# Patient Record
Sex: Female | Born: 1985 | Race: Asian | Hispanic: No | Marital: Married | Smoking: Never smoker
Health system: Southern US, Community
[De-identification: ages and names within clinical notes are randomized; demographics above are authoritative.]

## PROBLEM LIST (undated history)

## (undated) ENCOUNTER — Inpatient Hospital Stay (HOSPITAL_COMMUNITY): Payer: Self-pay

## (undated) DIAGNOSIS — E119 Type 2 diabetes mellitus without complications: Secondary | ICD-10-CM

## (undated) DIAGNOSIS — O24419 Gestational diabetes mellitus in pregnancy, unspecified control: Secondary | ICD-10-CM

## (undated) DIAGNOSIS — E282 Polycystic ovarian syndrome: Secondary | ICD-10-CM

## (undated) DIAGNOSIS — T7840XA Allergy, unspecified, initial encounter: Secondary | ICD-10-CM

## (undated) DIAGNOSIS — F419 Anxiety disorder, unspecified: Secondary | ICD-10-CM

## (undated) DIAGNOSIS — F32A Depression, unspecified: Secondary | ICD-10-CM

## (undated) DIAGNOSIS — D62 Acute posthemorrhagic anemia: Secondary | ICD-10-CM

## (undated) DIAGNOSIS — F329 Major depressive disorder, single episode, unspecified: Secondary | ICD-10-CM

## (undated) DIAGNOSIS — K589 Irritable bowel syndrome without diarrhea: Secondary | ICD-10-CM

## (undated) DIAGNOSIS — M545 Low back pain, unspecified: Secondary | ICD-10-CM

## (undated) DIAGNOSIS — J302 Other seasonal allergic rhinitis: Secondary | ICD-10-CM

## (undated) DIAGNOSIS — K859 Acute pancreatitis without necrosis or infection, unspecified: Secondary | ICD-10-CM

## (undated) DIAGNOSIS — K219 Gastro-esophageal reflux disease without esophagitis: Secondary | ICD-10-CM

## (undated) DIAGNOSIS — T8859XA Other complications of anesthesia, initial encounter: Secondary | ICD-10-CM

## (undated) DIAGNOSIS — K529 Noninfective gastroenteritis and colitis, unspecified: Secondary | ICD-10-CM

## (undated) HISTORY — DX: Polycystic ovarian syndrome: E28.2

## (undated) HISTORY — DX: Acute pancreatitis without necrosis or infection, unspecified: K85.90

## (undated) HISTORY — DX: Other seasonal allergic rhinitis: J30.2

## (undated) HISTORY — DX: Low back pain, unspecified: M54.50

## (undated) HISTORY — DX: Noninfective gastroenteritis and colitis, unspecified: K52.9

## (undated) HISTORY — DX: Gastro-esophageal reflux disease without esophagitis: K21.9

## (undated) HISTORY — DX: Other complications of anesthesia, initial encounter: T88.59XA

## (undated) HISTORY — DX: Irritable bowel syndrome without diarrhea: K58.9

## (undated) HISTORY — PX: LASIK: SHX215

## (undated) HISTORY — DX: Acute posthemorrhagic anemia: D62

## (undated) HISTORY — DX: Irritable bowel syndrome, unspecified: K58.9

## (undated) HISTORY — DX: Anxiety disorder, unspecified: F41.9

## (undated) HISTORY — DX: Major depressive disorder, single episode, unspecified: F32.9

## (undated) HISTORY — DX: Gestational diabetes mellitus in pregnancy, unspecified control: O24.419

## (undated) HISTORY — DX: Allergy, unspecified, initial encounter: T78.40XA

## (undated) HISTORY — DX: Depression, unspecified: F32.A

## (undated) HISTORY — DX: Type 2 diabetes mellitus without complications: E11.9

---

## 2007-01-10 HISTORY — PX: BACK SURGERY: SHX140

## 2007-09-10 HISTORY — PX: LUMBAR DISC SURGERY: SHX700

## 2008-08-03 HISTORY — PX: ESOPHAGOGASTRODUODENOSCOPY: SHX1529

## 2009-01-19 HISTORY — PX: COLONOSCOPY W/ BIOPSIES: SHX1374

## 2010-01-09 HISTORY — PX: LASIK: SHX215

## 2011-11-15 ENCOUNTER — Emergency Department: Payer: Self-pay

## 2011-11-15 ENCOUNTER — Emergency Department
Admission: EM | Admit: 2011-11-15 | Discharge: 2011-11-16 | Disposition: A | Discharge status: Home / Self Care | Payer: Charity | Attending: Emergency Medicine | Admitting: Emergency Medicine

## 2011-11-15 DIAGNOSIS — R209 Unspecified disturbances of skin sensation: Secondary | ICD-10-CM | POA: Insufficient documentation

## 2011-11-15 DIAGNOSIS — R51 Headache: Secondary | ICD-10-CM | POA: Insufficient documentation

## 2011-11-15 DIAGNOSIS — R5381 Other malaise: Secondary | ICD-10-CM | POA: Insufficient documentation

## 2011-11-15 DIAGNOSIS — M542 Cervicalgia: Secondary | ICD-10-CM | POA: Insufficient documentation

## 2011-11-15 DIAGNOSIS — M549 Dorsalgia, unspecified: Secondary | ICD-10-CM | POA: Insufficient documentation

## 2011-11-15 DIAGNOSIS — Z8739 Personal history of other diseases of the musculoskeletal system and connective tissue: Secondary | ICD-10-CM | POA: Insufficient documentation

## 2011-11-15 DIAGNOSIS — Z8719 Personal history of other diseases of the digestive system: Secondary | ICD-10-CM | POA: Insufficient documentation

## 2011-11-15 DIAGNOSIS — R42 Dizziness and giddiness: Secondary | ICD-10-CM | POA: Insufficient documentation

## 2011-11-15 DIAGNOSIS — R29898 Other symptoms and signs involving the musculoskeletal system: Secondary | ICD-10-CM | POA: Insufficient documentation

## 2011-11-15 DIAGNOSIS — M5430 Sciatica, unspecified side: Secondary | ICD-10-CM | POA: Insufficient documentation

## 2011-11-15 DIAGNOSIS — R002 Palpitations: Secondary | ICD-10-CM | POA: Insufficient documentation

## 2011-11-15 DIAGNOSIS — M545 Low back pain, unspecified: Secondary | ICD-10-CM | POA: Insufficient documentation

## 2011-11-15 LAB — CBC AND DIFFERENTIAL
Basophils Absolute Automated: 0.04 10*3/uL (ref 0.00–0.20)
Basophils Automated: 0 % (ref 0–2)
Eosinophils Absolute Automated: 0.32 10*3/uL (ref 0.00–0.70)
Eosinophils Automated: 3 % (ref 0–5)
Hematocrit: 38.2 % (ref 37.0–47.0)
Hgb: 12.6 g/dL (ref 12.0–16.0)
Immature Granulocytes Absolute: 0.03 10*3/uL
Immature Granulocytes: 0 % (ref 0–1)
Lymphocytes Absolute Automated: 2.47 10*3/uL (ref 0.50–4.40)
Lymphocytes Automated: 26 % (ref 15–41)
MCH: 26.1 pg — ABNORMAL LOW (ref 28.0–32.0)
MCHC: 33 g/dL (ref 32.0–36.0)
MCV: 79.3 fL — ABNORMAL LOW (ref 80.0–100.0)
MPV: 10 fL (ref 9.4–12.3)
Monocytes Absolute Automated: 0.45 10*3/uL (ref 0.00–1.20)
Monocytes: 5 % (ref 0–11)
Neutrophils Absolute: 6.2 10*3/uL (ref 1.80–8.10)
Neutrophils: 65 % (ref 52–75)
Nucleated RBC: 0 /100 WBC (ref 0–1)
Platelets: 264 10*3/uL (ref 140–400)
RBC: 4.82 10*6/uL (ref 4.20–5.40)
RDW: 14 % (ref 12–15)
WBC: 9.51 10*3/uL (ref 3.50–10.80)

## 2011-11-15 LAB — URINALYSIS WITH MICROSCOPIC
Bilirubin, UA: NEGATIVE
Blood, UA: NEGATIVE
Glucose, UA: NEGATIVE
Ketones UA: NEGATIVE
Leukocyte Esterase, UA: NEGATIVE
Nitrite, UA: NEGATIVE
Protein, UR: NEGATIVE
Specific Gravity UA: 1.005 (ref 1.001–1.035)
Urine pH: 6.5 (ref 5.0–8.0)
Urobilinogen, UA: NORMAL mg/dL

## 2011-11-15 LAB — LIPASE: Lipase: 32 U/L (ref 8–78)

## 2011-11-15 LAB — POCT PREGNANCY TEST, URINE HCG: POCT Pregnancy HCG Test, UR: NEGATIVE

## 2011-11-15 LAB — COMPREHENSIVE METABOLIC PANEL
ALT: 27 U/L (ref 0–55)
AST (SGOT): 20 U/L (ref 5–34)
Albumin/Globulin Ratio: 1.2 (ref 0.9–2.2)
Albumin: 4.2 g/dL (ref 3.5–5.0)
Alkaline Phosphatase: 73 U/L (ref 40–150)
BUN: 10 mg/dL (ref 7.0–19.0)
Bilirubin, Total: 0.4 mg/dL (ref 0.2–1.2)
CO2: 23 mEq/L (ref 22–29)
Calcium: 9.5 mg/dL (ref 8.5–10.5)
Chloride: 106 mEq/L (ref 98–107)
Creatinine: 0.8 mg/dL (ref 0.6–1.0)
Globulin: 3.6 g/dL (ref 2.0–3.6)
Glucose: 104 mg/dL — ABNORMAL HIGH (ref 70–100)
Potassium: 4.1 mEq/L (ref 3.5–5.1)
Protein, Total: 7.8 g/dL (ref 6.0–8.3)
Sodium: 138 mEq/L (ref 136–145)

## 2011-11-15 LAB — GFR: EGFR: >60

## 2011-11-15 MED ORDER — OXYCODONE-ACETAMINOPHEN 5-325 MG PO TABS
2.00 | ORAL_TABLET | Freq: Once | ORAL | Status: AC
Start: 2011-11-15 — End: 2011-11-15
  Administered 2011-11-15: 2 via ORAL
  Filled 2011-11-15: qty 2

## 2011-11-15 MED ORDER — ONDANSETRON HCL 4 MG/2ML IJ SOLN
4.00 mg | Freq: Once | INTRAMUSCULAR | Status: AC
Start: 2011-11-15 — End: 2011-11-15
  Administered 2011-11-15: 4 mg via INTRAVENOUS
  Filled 2011-11-15: qty 2

## 2011-11-15 MED ORDER — DIAZEPAM 5 MG PO TABS
5.00 mg | ORAL_TABLET | Freq: Once | ORAL | Status: AC
Start: 2011-11-15 — End: 2011-11-15
  Administered 2011-11-15: 5 mg via ORAL
  Filled 2011-11-15: qty 1

## 2011-11-15 MED ORDER — DIPHENHYDRAMINE HCL 50 MG/ML IJ SOLN
25.00 mg | Freq: Once | INTRAMUSCULAR | Status: AC
Start: 2011-11-15 — End: 2011-11-15
  Administered 2011-11-15: 25 mg via INTRAVENOUS
  Filled 2011-11-15: qty 1

## 2011-11-15 NOTE — ED Provider Notes (Signed)
Date: 11/15/2011  Time: 7:25 PM  Patient Name: [name],[name]  Attending Physician: Claiborne Billings, MD    Attending Note:   I have reviewed and agree with history. The pertinent physical exam has been documented. The patient was seen and examined by the  resident Stevenson Clinch, MD. The plan of care was discussed with me.     Selected historical findings: [name] [name] is a 27 y.o.female with a h/o chronic back pain, pancreatitis, colitis, c/o lower back pain, bilat leg pain, and abd pain. Pt rpts these sxs are typically chronic but have progressively intensified today prompting ED visit. Pt rpts that sxs feel similar to what she experienced prior to her back surgery for disc herniation in 2010. Pt rpts pain used to be manageable. Pt rpts being able to ambulate but with moderate amount of pain. Pt also c/o L 1st toe pain. Pt rpts associated sx of dysuria. Pt denies loss of sensation, incontinence of bladder or bowel, any other concerns.     History obtained by patient.     A/P: 26 y/o F with h/o acute back pain and abd pain.      Back pain - worsening lumbar back pain, weakness in left lower ext.  Reports weakness as new.  No loss of control of bowel/bladder.  No fever.  Will evaluate with MRI.      abd pain - benign exam, exacerbation of chronic pain.  Will give lab work, re-exam.      10:03 PM  Repeat exam after pain control.  Still with LLE weakness.  Will evaluate with imaging.  Labs ok.  Pt signed out to Dr. Arvilla Market to follow MRI results and dispo patient.        CRITICAL CARE TIME: No Critical Care Time    PMH:  Past Medical History   Diagnosis Date   . Pancreatitis    . Colitis      Past Surgical History   Procedure Date   . Back surgery        Social History:  History     Social History   . Marital Status: Married     Spouse Name: N/A     Number of Children: N/A   . Years of Education: N/A     Social History Main Topics   . Smoking status: Not on file   . Smokeless tobacco: Not on file   .  Alcohol Use:    . Drug Use:    . Sexually Active: Not on file     Other Topics Concern   . Not on file     Social History Narrative   . No narrative on file       ROS : All systems were reviewed and are negative for acute complaints, except as described above.     Selected physical examination findings:  BP 138/97  Pulse 116  Temp 97.3 F (36.3 C)  Resp 18  SpO2 95%  CONSTITUTIONAL: Patient is afebrile, Vital signs stable, Well appearing, Patient appears uncomfortable, Alert and oriented X 3.   HEAD: Atraumatic, Normocephalic.   EYES: Eyes are normal to inspection, Pupils equal, round and reactive to light, Extraocular muscles intact, Sclera are normal, Conjunctiva are normal.   ENT: Mouth normal to inspection.   NECK: Normal ROM, Cervical spine nontender.   RESP: Chest is nontender, Breath sounds normal, No respiratory distress.   CARDIOVASCULAR: RRR, Heart sounds normal, No extremity edema.   ABDOMEN: Mild ttp to entire L side  abdomen, no rebound, no guarding, No masses, No distension.   BACK: There is no CVA Tenderness, There is no tenderness to palpation, Normal inspection.   UPPER EXTREMITY: Inspection normal, No cyanosis, No clubbing, No edema, Normal range of motion.   LOWER EXTREMITY: Inspection normal, No cyanosis, No clubbing, No edema, Normal range of motion, No calf tenderness.   NEURO: GCS is 15, No focal motor deficits, Speech normal.   SKIN: Skin is warm/dry, normal color.   LYMPHATIC: No adenopathy in neck.   PSYCHIATRIC: Oriented X 3, Normal affect.     Neurologic Exam:     MS: Alert, nml orientation, no aphasia.     CN's: CN's 2-12 intact, PERRL, EOMI, no facial droop, nml facial sensation, midline tongue/uvula, no dysarthria.     Motor: No focal motor deficits, 5/5 str in RLE/RUE/LUE, 3/5 in LLE, nml tone, no pronator drift.     Sensory: No focal sensory deficits.     Reflexes:2+ reflexes throughout      Patient Vitals for the past 12 hrs:   BP Temp Pulse Resp   11/15/11 1820 138/97 mmHg 97.3  F (36.3 C) 116  18         UA with Micro (Edited)   Component (Lab Inquiry)         Result Time  Urine Type  COLOR U  CLARITY U  Specific Gravity UA  Urine pH     11/15/11 2106  Clean Catch  Colorless  Clear  1.005  6.5               Result Time  LEUKOCYTES, UR  NITRITE  Protein, UA  Glucose, UA  KETONES U     11/15/11 2106  Negative  Negative  Negative  Negative  Negative               Result Time  Urobilinogen, UA  BILI UR  Blood, UA  RBC UA  WBC U     11/15/11 2106  Normal  Negative  Negative  NOTE: URINE CHEMISTRY INDICATORS ARE NEGATIVE. URINE MICROSCOPIC NOT INDICATED.  0 - 5  0 - 5               Result Time  Squamous Epithelial Cells, Urine     11/15/11 2106  0 - 5             Comprehensive Metabolic Panel (CMP) (Final result)  Abnormal  Component (Lab Inquiry)         Result Time  Glucose  BUN  CREATININE  NA  K     11/15/11 2033  104 (H)  Interpretive Data for Adult Female and Female Population Indeterminate Range: 100-125 mg/dL Equal to or greater than 126 mg/dL meets the ADA guidelines for Diabetes Mellitus diagnosis if symptoms are present and confirmed by repeat testing. Random (Non-Fasting)Interpretive Data (Adults): Equal to or greater than 200 mg/dL meets the ADA guidelines for Diabetes Mellitus diagnosis if symptoms are present and confirmed by Fasting Glucose or GTT.  10.0  0.8  138  4.1               Result Time  CL  CO2  CALCIUM  PROTEIN  Albumin     11/15/11 2033  106  23  9.5  7.8  4.2               Result Time  AST  ALT  ALK PHOS  BILI INDIR  Globulin  11/15/11 2033  20  27  73  0.4  3.6               Result Time  Albumin/Globulin Ratio     11/15/11 2033  1.2             LAB-Lipase (Final result)   Component (Lab Inquiry)         Result Time  LIPASE     11/15/11 2033  32             GFR (Final result)   Component (Lab Inquiry)         Result Time  EGFR     11/15/11 2033  >60.0  Disease State Reference Ranges: Chronic Kidney Disease; < 60 ml/min/1.73 sq.m Kidney Failure; < 15 ml/min/1.73 sq.m  [Calculated using IDMS-Traceable MDRD equation (based on gender, age and black vs. non-black race) recommended by [employer] Kidney Disease Education Program. No data available for non-white, non-black race.] GFR estimates are unreliable in patients with: Rapidly changing kidney function or recent dialysis Extreme age, body size or body composition(obesity, severe malnutrition). Abnormal muscle mass (limb amputation, muscle wasting). In these patients, alternative determinations of GFR should be obtained.             CBC and Differential (Final result)  Abnormal  Component (Lab Inquiry)         Result Time  WBC  RBC  Hgb  HCT  MCV     11/15/11 2024  9.51  4.82  12.6  38.2  79.3 (L)               Result Time  MCH  MCHC  RDW  Platelets  MPV     11/15/11 2024  26.1 (L)  33.0  14  264  10.0               Result Time  Neutrophils  Lymphocytes Automated  Monocytes  Eosinophils Automated  Basophils Automated     11/15/11 2024  65  26  5  3   0               Result Time  Immature Granulocyte  Nucleated RBC  NEUTRO ABS  Abs Lymph Automated  Abs Mono Automated     11/15/11 2024  0  0  6.20  2.47  0.45               Result Time  Abs Eos Automated  Absolute Baso Automated  Absolute Immature Granulocyte     11/15/11 2024  0.32  0.04  0.03             Urine HCG POC (Final result)   Component (Lab Inquiry)         Result Time  POCT QC  POCT Pregnancy HCG Test, UR  Comment:     11/15/11 2007  Pass  Negative  Negative Value is Normal in Healthy Males or Healthy non-pregnant Females          Radiology Results (24 Hour)     ** No Results found for the last 24 hours. **          ED Disposition     None          Diagnosis:  No diagnosis found.    _______________________________    ATTESTATION  I am the first provider for this patient and I personally performed the services documented.  Treatment Team: Scribe: Graciella Freer is scribing for me on  [name] [name],[name]. The note accurately reflects work and decisions made by me.   Claiborne Billings,  MD    I, Treatment Team: Scribe: Graciella Freer, am serving as a scribe to document services personally performed by Claiborne Billings, MD based on my observations and the providers statements to me.     Treatment Team: Scribe: Venida Jarvis (ED Scribe)  _______________________________        Tyson Babinski, MD  11/17/11 [phone]

## 2011-11-15 NOTE — ED Notes (Signed)
MRI Checklist completed and faxed at this time.

## 2011-11-15 NOTE — ED Provider Notes (Signed)
Physician/Midlevel provider first contact with patient: 11/15/11 1837         History     Chief Complaint   Patient presents with   . Back Pain   . Leg Pain     Pt is a 26 yo F here for worsening lower-back pain and leg pain. In 2007 she was found to have disk herniation and was tx with steroid injections. Then in 2009 she had back surgery. Pain continued  But was lessened s/p surgery.   Last night she started to develop worsening pain in her lower back and legs b/l. She also states she has diffuse abdominal pain and some neck pain.   Pain in her legs is both in front and back radiating from her back all the way to her toes. She had some trouble ambulating due to the pain. She feels some numbness in her toes and says she has a hard time moving her left toe vs her right.  She also notes that she is sore all over.  No recent illnesses, no N/V. Some constipation and diarrhea intermittently.          Past Medical History   Diagnosis Date   . Pancreatitis    . Colitis        Past Surgical History   Procedure Date   . Back surgery        No family history on file.    Social  History   Substance Use Topics   . Smoking status: Never Smoker    . Smokeless tobacco: Not on file   . Alcohol Use:        .     Allergies   Allergen Reactions   . Dilaudid (Hydromorphone Hcl)    . Morphine        Current/Home Medications    No medications on file        Review of Systems   Constitutional: Positive for activity change and fatigue. Negative for fever.   HENT: Positive for neck pain and neck stiffness. Negative for congestion, facial swelling and rhinorrhea.    Eyes: Positive for visual disturbance. Negative for photophobia.   Respiratory: Negative for apnea, chest tightness and shortness of breath.    Cardiovascular: Positive for palpitations. Negative for chest pain.   Musculoskeletal: Positive for back pain, joint swelling, arthralgias and gait problem.   Neurological: Positive for dizziness, weakness, numbness and headaches.  Negative for seizures, facial asymmetry and light-headedness.       Physical Exam    BP 138/97  Pulse 116  Temp 97.3 F (36.3 C)  Resp 18  SpO2 95%    Physical Exam   Constitutional: She is oriented to person, place, and time. She appears well-developed and well-nourished. She appears distressed.   HENT:   Head: Normocephalic and atraumatic.   Mouth/Throat: Oropharynx is clear and moist. No oropharyngeal exudate.   Eyes: Conjunctivae normal and EOM are normal. Pupils are equal, round, and reactive to light.   Neck: Normal range of motion. Neck supple. Spinous process tenderness and muscular tenderness present. No tracheal deviation present. No thyromegaly present.   Cardiovascular: Normal rate, regular rhythm and normal heart sounds.    No murmur heard.  Pulmonary/Chest: Effort normal and breath sounds normal. No respiratory distress. She has no wheezes. She has no rales.   Abdominal: Soft. Bowel sounds are normal. There is generalized tenderness.   Musculoskeletal:        Right knee: tenderness found.  Left knee: tenderness found.        Right ankle: tenderness.        Left ankle: tenderness.        Cervical back: She exhibits tenderness, bony tenderness and pain.        Lumbar back: She exhibits tenderness.        Right upper leg: She exhibits tenderness.        Left upper leg: She exhibits tenderness.        Right lower leg: She exhibits tenderness.        Left lower leg: She exhibits tenderness.        Right foot: She exhibits tenderness.        Left foot: She exhibits tenderness.        Straight leg raise elicited pain with 2 in off bed b/l   Lymphadenopathy:     She has no cervical adenopathy.   Neurological: She is alert and oriented to person, place, and time. No sensory deficit.   Skin: Skin is warm. No rash noted.       MDM and ED Course     ED Medication Orders      Start     Status Ordering Provider    11/15/11 2230   diphenhydrAMINE (BENADRYL) injection 25 mg   Once      Route: Intravenous   Ordered Dose: 25 mg         Last MAR action:  Given SHELLEY, NEAL H    11/15/11 2015   diazepam (VALIUM) tablet 5 mg   Once      Route: Oral  Ordered Dose: 5 mg         Last MAR action:  Given SHELLEY, NEAL H    11/15/11 2000   ondansetron (ZOFRAN) injection 4 mg   Once      Route: Intravenous  Ordered Dose: 4 mg         Last MAR action:  Given SHELLEY, NEAL H    11/15/11 2000   oxyCODONE-acetaminophen (PERCOCET) 5-325 MG per tablet 2 tablet   Once      Route: Oral  Ordered Dose: 2 tablet         Last MAR action:  Given SHELLEY, NEAL H                 MDM  Number of Diagnoses or Management Options  Diagnosis management comments: 26 yo F with H/O of lumbar disk herniation and associated LE pain s/p surgery in 2009. Presents with worsening of her pain with some associated diffuse sorness. Pt is exhibiting some weakness in her L LE on physical exam. Pt is in mild distress.    Labs show no abnormalities. Pt may benefit from lumbar MRI to evaluate any acute process.        Procedures    Clinical Impression & Disposition     Clinical Impression  Final diagnoses:   None        ED Disposition     None           New Prescriptions    No medications on file               Barnie Alderman, MD  Resident  11/15/11 Jon Billings, MD  Resident  11/16/11 [phone]

## 2011-11-15 NOTE — ED Notes (Signed)
PT C/O LOWER BACK/BILATERAL LEG PAIN, WITH HX OF HERNIATED DISKS AND BACK SURGERY IN 2010; HAS HAD PAIN EVER SINCE SURGERY BUT SAYS SHE WAS UNABLE TO WALK LAST NIGHT; SAYS HER SURGEON IS IN [place] AND SHE DOESN'T HAVE INSURANCE SO HASN'T SEEN THEM; ALSO SAYS SHE IS HAVING ABDOMINAL PAIN BUT LEG/BACK PAIN IS WHAT IS BOTHERING HER MOST

## 2011-11-16 ENCOUNTER — Emergency Department: Payer: Charity

## 2011-11-16 ENCOUNTER — Encounter: Payer: Self-pay | Admitting: Family Medicine

## 2011-11-16 MED ORDER — HYDROCODONE-ACETAMINOPHEN 5-500 MG PO TABS
1.00 | ORAL_TABLET | ORAL | Status: AC | PRN
Start: 2011-11-16 — End: 2011-11-26

## 2011-11-16 MED ORDER — PREDNISONE 10 MG PO TABS
ORAL_TABLET | ORAL | Status: AC
Start: 2011-11-16 — End: 2011-11-26

## 2011-11-16 MED ORDER — GADOBUTROL 1 MMOL/ML IV SOLN
7.00 mL | Freq: Once | INTRAVENOUS | Status: AC | PRN
Start: 2011-11-16 — End: 2011-11-16
  Administered 2011-11-16: 7 mmol via INTRAVENOUS
  Filled 2011-11-16: qty 7.5

## 2011-11-16 MED ORDER — IBUPROFEN 800 MG PO TABS
ORAL_TABLET | ORAL | Status: AC
Start: 2011-11-16 — End: 2011-11-26

## 2011-11-16 NOTE — ED Provider Notes (Addendum)
Second MD Attestation    I saw the patient as the second MD provider.     Reevaluation    Pt is awaiting MRI results.     Elray Mcgregor, MD  11/16/11 0206    Reevaluation/ED Course    I spoke c rads md re mri: he says that there is mild disc bulge at l5-s1, though worse on the r than left. No c.e.s.  Will d/c pt home with close f/up--ret precs given in case of worsening sx, bilat weakness dev'ing, or any bowel or bladder sx.     I counseled extensively on nature of problem--voiced understanding, agrees to follow up.   Given strict return precautions--fully understands:   Pt is to return immediately to emergency department if any worsening symptoms at all--otherwise, return to the emergency department tomorrow for a recheck or follow up with primary physician tomorrow.        Lab/Radiology Results Interpretation    All the following lab/radiology results available were both reviewed and interpreted by me Arvilla Market).     Results     Procedure Component Value Units Date/Time    UA with Micro [[id number]] Collected:11/15/11 1956    Specimen Information:Urine Updated:11/15/11 2106     Urine Type Clean Catch      Color, UA Colorless      Clarity, UA Clear      Specific Gravity UA 1.005      Urine pH 6.5      Leukocytes, UA Negative      Nitrite, UA Negative      Protein, UA Negative      Glucose, UA Negative      Ketones UA Negative      Urobilinogen, UA Normal mg/dL      Bilirubin, UA Negative      Blood, UA Negative      RBC, UA 0 - 5 /HPF      WBC, UA 0 - 5 /HPF      Squamous Epithelial Cells, Urine 0 - 5 /HPF     Comprehensive Metabolic Panel (CMP) [[id number]]  (Abnormal) Collected:11/15/11 1956    Specimen Information:Blood Updated:11/15/11 2033     Glucose 104 (H) mg/dL      BUN 57.8 mg/dL      Creatinine 0.8 mg/dL      Sodium 469 mEq/L      Potassium 4.1 mEq/L      Chloride 106 mEq/L      CO2 23 mEq/L      Calcium 9.5 mg/dL      Protein, Total 7.8 g/dL      Albumin 4.2 g/dL      AST (SGOT) 20 U/L      ALT 27 U/L       Alkaline Phosphatase 73 U/L      Bilirubin, Total 0.4 mg/dL      Globulin 3.6 g/dL      Albumin/Globulin Ratio 1.2     LAB-Lipase [[id number]] Collected:11/15/11 1956    Specimen Information:Blood Updated:11/15/11 2033     Lipase 32 U/L     GFR [[id number]] Collected:11/15/11 1956     EGFR >60.0   Updated:11/15/11 2033    CBC and Differential [[id number]]  (Abnormal) Collected:11/15/11 1956    Specimen Information:Blood Updated:11/15/11 2024     WBC 9.51 x10 3/uL      RBC 4.82 x10 6/uL      Hgb 12.6 g/dL      Hematocrit 74.2 %      MCV  79.3 (L) fL      MCH 26.1 (L) pg      MCHC 33.0 g/dL      RDW 14 %      Platelets 264 x10 3/uL      MPV 10.0 fL      Neutrophils 65 %      Lymphocytes Automated 26 %      Monocytes 5 %      Eosinophils Automated 3 %      Basophils Automated 0 %      Immature Granulocyte 0 %      Nucleated RBC 0 /100 WBC      Neutrophils Absolute 6.20 x10 3/uL      Abs Lymph Automated 2.47 x10 3/uL      Abs Mono Automated 0.45 x10 3/uL      Abs Eos Automated 0.32 x10 3/uL      Absolute Baso Automated 0.04 x10 3/uL      Absolute Immature Granulocyte 0.03 x10 3/uL     Urine HCG POC [[id number]] Collected:11/15/11 2008    Specimen Information:Urine Updated:11/15/11 2008     POCT QC Pass      POCT Pregnancy HCG Test, UR Negative      Comment:        Result:     Negative Value is Normal in Healthy Males or Healthy non-pregnant Females        Radiology Results (24 Hour)     Procedure Component Value Units Date/Time    MRI L-Spine with/without Contrast [[id number]] Resulted:11/16/11 0300    Order Status:Sent  Updated:11/16/11 0055            Diagnosis    Back Pain and Leg Pain    There were no encounter diagnoses.    Condition    Stable      Elray Mcgregor, MD  11/16/11 [phone]

## 2011-11-16 NOTE — Discharge Instructions (Signed)
Return immediately to the emergency room if you have any worsening symptoms!!! Otherwise, see general doctor in the next 1 day, or return to the emergency department in 1 day if you have any trouble getting an appointment.     Sedating Medication (Edu)    You have been given a medication, or a prescription for a medication, that causes drowsiness or dizziness. While taking this medication, DO NOT drive a car, operate machinery, or perform jobs that require you to be alert.    DO NOT drink alcoholic beverages while taking this medicine.    If you become dizzy, sit or lie down at the first signs. You should be careful when going up and down stairs.    Never give this medication to others.    Keep this medication out of reach of children.    You should not take or save old medications. They should be thrown away when outdated.    Keep all medicines in a cool, dry place. DO NOT keep them in your bathroom medicine cabinet or in a cabinet above the stove.            Sciatica    You have been diagnosed with sciatica.    Sciatica is a general term for pain in the low back, hip, or buttocks that travels down the leg. The pain rarely goes below the knees. The pain can be severe and can last for a few days up to a few weeks. A common cause of this type of pain is a pinched nerve from a slipped disk. Sometimes the cause of the pain is never determined.    Treatment includes rest and pain medications. Steroids may be helpful, especially if the cause is a pinched nerve.    YOU SHOULD SEEK MEDICAL ATTENTION IMMEDIATELY, EITHER HERE OR AT THE NEAREST EMERGENCY DEPARTMENT, IF ANY OF THE FOLLOWING OCCURS:   You develop numbness (loss of feeling) or tingling (pins and needles) in your legs.   You develop weakness in your legs that keeps you from standing or walking or makes you stumble or trip over your own feet.   You have trouble controlling your bowels or bladder (you soil or wet yourself).   (MEN) You are unable to  have an erection.   Your symptoms become worse.   You have fevers or shaking chills, especially with an increase in back pain.      Back Pain NOS    You have been seen for back pain.    Back pain can happen anywhere from the neck down to the low back. Back pain has many different causes. Some of the more common are: Bone pain, muscle strain, muscle spasm, pain from overuse, and pinched nerves. Other problems can cause what feels like back pain. But the pain is really coming from another organ. This could be a kidney infection. This can cause lower back pain.    Your doctor did not find any pain over the bones in your back (even though you might have pain in the muscles of the back). This means it is very unlikely that you have a broken bone (fracture) in your back. Your doctor did not think it was necessary to take an x-ray.    The doctor still does not know the exact cause of your pain. Your problem does not seem to be from a dangerous cause. It is safe for you to go home today.    Some things you can try to help  your back feel better are:   Apply a warm damp washcloth to the back where you have pain for 20 minutes at a time, at least 4 times per day.    Have someone massage the sore parts of your back.    Don t do any heavy lifting or bending. You can go back to normal daily activities if they don t make the pain worse.   You can use anti-inflammatory pain medicine for your pain. This could be Ibuprofen (Advil or Motrin). You can buy these at most stores. Follow the directions on the package.    This pain may last for the next few days. If your pain gets better, you probably do not need to see a doctor. However, if your symptoms get worse or you have new symptoms, you should return here or go to the nearest Emergency Department.    Call your physician or go to the nearest Emergency Department if you your pain does not improve within 4 weeks or your pain is bad enough to seriously limit your normal  activities.    YOU SHOULD SEEK MEDICAL ATTENTION IMMEDIATELY, EITHER HERE OR AT THE NEAREST EMERGENCY DEPARTMENT, IF ANY OF THE FOLLOWING OCCURS:   - You think the pain is coming from somewhere other than your back. This can include chest pain. This is sometimes from angina (heart pains) or other dangerous causes.   You have shortness of breath, sweating, chest pain (or pressure, heaviness, indigestion, etc).   You have abdominal (belly) pain that goes through to your back.   Your arms and legs tingle or get numb (lose feeling).   Your arms or legs are weak.   You lose control of your bladder or bowels. If this were to happen, it may cause you to wet or soil yourself.    You have problems urinating (peeing).   You have fevers (a temperature of more than 100.79F or 38C).   Your pain gets worse.      [facility] Referral    [facility] Referral     You have been referred to the [facility] for your follow-up care. Please contact them for assistance at [phone] or [phone].  You can also find them online at:  [email or web address]    [facility] Referral    You have been referred to the [facility] for management of your back problem. They have a personalized, comprehensive, multi-specialty approach as well as physical therapy centers, imaging centers and pain clinics. Please call them today or the next business day to arrange for follow-up care.    Phone: [phone]  Web: [email or web address]

## 2011-12-15 ENCOUNTER — Ambulatory Visit: Payer: Self-pay

## 2011-12-22 ENCOUNTER — Ambulatory Visit: Admission: RE | Admit: 2011-12-22 | Payer: Self-pay | Source: Ambulatory Visit

## 2012-01-16 ENCOUNTER — Ambulatory Visit: Admission: RE | Admit: 2012-01-16 | Payer: Self-pay | Source: Ambulatory Visit

## 2012-01-16 ENCOUNTER — Ambulatory Visit
Admission: RE | Admit: 2012-01-16 | Discharge: 2012-01-16 | Disposition: A | Discharge status: Home / Self Care | Payer: Charity | Source: Ambulatory Visit | Attending: Internal Medicine | Admitting: Internal Medicine

## 2012-01-16 DIAGNOSIS — M545 Low back pain, unspecified: Secondary | ICD-10-CM | POA: Insufficient documentation

## 2012-01-16 NOTE — PT Eval Note (Addendum)
Physical Therapy Spine Evaluation          [facility]   [address]   [place], [place] [address]   [phone]     Patient:  [name] [name]                                                   BJY#:[id number]      Start of Care:  01/16/2012                                                         No of Visits:    Time of treatment:  Start Time : 1405                               Stop Time: 1510    Evaluation    Therapeutic ex  15 mins         Precautions/Contraindications: None  FALLS RISK: No    Diagnosis: Low back pain with H/O herniated disc         Referring MD: Pollyann Glen, MD.    Patient's husband came with her and was present throughout the evaluation.  History: H/O low back pain since 2006. Reports couple of falls,fell on ice once. Conservative treatment did not work and had surgery in 2009. States that pain did not really go away after surgery. Had no PT after surgery. Hasn't done any exercises. Had gained a lot of weight, lost ~18-20 lbs last year. Now has low back pain radiating to both LEs left more than right. Multiple joint pain, soreness in the ankle. Disturbed sleep.  Pt stated that she is concerned about her left big toe pain because it is a new pain.  Social: Married, does not work at this time. 10-12 steps to get into the apartment. Used to walk but hasn't done it since back pain got worse in November 2013. No exercises. Heat helps.  Prior level of function: Independent with some back pain.  Cognition: Oriented to time, place and person.  Functional Limitations: Lifting, sitting, standing, walking.  Posture: overweight, increased lumbar lordosis.  Pelvic alignment: LOL, left anterior innominate.  Gait: No obvious deviations noted, no assistive device.  Palpation: Tenderness over lumbar paraspinals.  Pain: 7/10    ROM L/T SPINE C SPINE     Forward Bending 40 NT     Backward  Bending NT      Side Bending Left 30      Side Bending Right 30      Rotation Left NT      Rotation Right NT          Strength R L     Iliopsoas (L2) 5 5     Quadriceps (L3) 5 5     Tibialis Anterior (L4) 5 5     EHL (L5) 5 4+     Peroneus L & B (S1) 5 5     Gluts 4 4+     Hamstrings (S1,2) 5 4+     Abdominals      weak  Back Extensors weak      Heel/Toe Walk NT        Special Tests:  Slump: NT  Sciatic nerve: -ve  Femoral nerve: -ve  SLR: -ve  FABER: -ve  LLD: no     Flexibility: Tight hip flexors, ITB, gastrocs bilaterally, left more than right.  Sensation and DTR: Sensation diminished to light touch over posterior- medial calf, medial thigh on left.     DTR: Patellar 2+ bilaterally, Achilles left 2+, right could not elicit.    Treatment Activities:  METs to correct pelvic alignment, HEP instructed, copies given.    Educated the patient to role of physical therapy, plan of care, goals of therapy. Patient is concerned about her big toe pain and about back pain. Suggested her to talk to her primary about toe pain. See how PT helps and then consider further consult with either Drs Fitzpatrick/ Selena Batten or Neurosurgery.     Assessment:  27 year old female with H/O low back pain radiating to both LEs, H/O back surgery in 2009, seen in PT for consult.    Oswestry:34%    PT Diagnosis: Increased pain, decreased strength, SI dysfunction, multiple joint pain.  Goal potential/prognosis: Good  Goals: Independent HEP and symptom management.  Patient Education:HEP, copies given.  Patient Goal: Relieve pain, gain strength and balance.  Treatment Given: Evaluation, HEP.    Plan:  Patient will be seen 2 x week for 4 weeks for manual therapy, therapeutic exercises, HEP modalities as needed.  Pt will continue with HEP as instructed.     Erin Sons, PT, DPT      [place] [phone]

## 2012-01-25 ENCOUNTER — Ambulatory Visit: Payer: Charity

## 2012-02-05 ENCOUNTER — Ambulatory Visit
Admission: RE | Admit: 2012-02-05 | Discharge: 2012-02-05 | Disposition: A | Discharge status: Home / Self Care | Payer: Charity | Source: Ambulatory Visit

## 2012-02-05 NOTE — Progress Notes (Signed)
[facility]  [address]  [place], [place] [address]  [phone]    Daily Treatment Note    Patient:  [name] [name]     MRN#:  [id number]  Start of care:  01/16/2012              Medical Dx: Low back pain with H/O herniated disc  Therapy Dx: Increased pain, decreased strength, SI dysfunction, multiple joint pain  Referring MD: Pollyann Glen, MD.      Time Calculation  PT Received On: 02/05/12  Start Time: 1420  Stop Time: 1505  Time Calculation (min): 45 min      Therapeutic ex      30 mins   Heat     15 mins         min      Visit number: 2  Change in Medications: No  Change in Medical Status: No    Precautions/Contraindications:None    FALLS RISK: No    Subjective:   Doing the exercises regularly. Continues to have pain.  Pain: 5-7/10  Patient is appropriate for Physical Therapy intervention at this time.  Pt is agreeable to participation in the therapy session.     Objective:  Pelvic alignment: good.  Stretches: KTC,LTR, hamstrings, gastrocs, hip flexors, ITB, piriformis.  STM to lumbar paraspinals, surgical scar.  LLLT to L5-S1 and gluteal region bilaterally.    Heat to back  X 15 minutes.    Treatment Activities  Educated the patient to role of physical therapy, plan of care, goals of therapy.    Assessment:  Good pelvic alignment. Improved flexibility.    Plan:  Continue PT. Review and update exercises as needed.  Pt will continue with HEP as instructed.     Erin Sons, PT, DPT      [place] [phone]

## 2012-02-08 ENCOUNTER — Ambulatory Visit
Admission: RE | Admit: 2012-02-08 | Discharge: 2012-02-08 | Disposition: A | Discharge status: Home / Self Care | Payer: Charity | Source: Ambulatory Visit

## 2012-02-08 NOTE — Progress Notes (Signed)
[facility]  [address]  [place], [place] [address]  [phone]    Daily Treatment Note    Patient:  [name] [name]     MRN#:  [id number]  Start of care:  01/16/2012              Medical Dx: Low back pain with H/O herniated disc  Therapy Dx: Increased pain, decreased strength, SI dysfunction, multiple joint pain  Referring MD: Pollyann Glen, MD.      Time Calculation  PT Received On: 02/08/12  Start Time: 1435  Stop Time: 1530  Time Calculation (min): 55 min      Therapeutic ex      30 mins   Heat     15 mins         min      Visit number: 3  Change in Medications: No  Change in Medical Status: No    Precautions/Contraindications:None    FALLS RISK: No    Subjective:   Was sore for the rest of the day after PT last time. Continues to have pain.      Pain: 5-7/10  Patient is appropriate for Physical Therapy intervention at this time.  Pt is agreeable to participation in the therapy session.     Objective:  Pelvic alignment: good.  Stretches: KTC,LTR, hamstrings, gastrocs, hip flexors, ITB, piriformis.  STM to lumbar paraspinals.  LLLT to L5-S1 and gluteal region bilaterally.    Heat to back  X 15 minutes.    Treatment Activities  Educated the patient to role of physical therapy, plan of care, goals of therapy.    Assessment:  Good pelvic alignment. Improved flexibility.    Plan:  Continue PT. Start gentle stabilization exercises next session.  Pt will continue with HEP as instructed.     Erin Sons, PT, DPT      [place] [phone]

## 2012-02-12 ENCOUNTER — Ambulatory Visit
Admission: RE | Admit: 2012-02-12 | Discharge: 2012-02-12 | Disposition: A | Discharge status: Home / Self Care | Payer: Charity | Source: Ambulatory Visit | Attending: Internal Medicine | Admitting: Internal Medicine

## 2012-02-12 ENCOUNTER — Other Ambulatory Visit: Payer: Self-pay | Admitting: Internal Medicine

## 2012-02-12 DIAGNOSIS — M79609 Pain in unspecified limb: Secondary | ICD-10-CM | POA: Insufficient documentation

## 2012-02-12 DIAGNOSIS — M545 Low back pain, unspecified: Secondary | ICD-10-CM | POA: Insufficient documentation

## 2012-02-12 NOTE — Progress Notes (Signed)
[facility]  [address]  [place], [place] [address]  [phone]    Daily Treatment Note    Patient:  [name] [name]     MRN#:  [id number]  Start of care:  01/16/2012              Medical Dx: Low back pain with H/O herniated disc  Therapy Dx: Increased pain, decreased strength, SI dysfunction, multiple joint pain  Referring MD: Pollyann Glen, MD.       Treatment Date: 02/12/12  Treatment Time: 1515- 1615      Therapeutic ex      40 mins   Heat     15 mins         min      Visit number: 3  Change in Medications: No  Change in Medical Status: No    Precautions/Contraindications:None    FALLS RISK: No    Subjective:   Was sore for the rest of the day after PT last time. Continues to have pain.      Pain: 5-6/10  Patient is appropriate for Physical Therapy intervention at this time.  Pt is agreeable to participation in the therapy session.     Objective:  Reviewed HEP w/pt.  Cues needed with most ex - cannot recall ex independent of cues (verbal and visual)  Instructed in correct sit/Supine and correct squat to pick up item on floor.    Initiated lumbar stabilization with HEP as follows:  Hook lying TVA 5-10 sec x 10  Hook lying w/ TVA and hip flex x 5 sec x 1o, alternate LE  Hook lying w/ TVA with LAQ, alternate LE x 10  Sarhman level 1 x 10  Edcucation: HEP progression, to D/C other "back ex" she is doing of her own and focus on what we teach       Heat to back  X 15 minutes.    Treatment Activities  Educated the patient in HEP, Therapeutic ex , beginning body mechanics.    Assessment:  Pt with good motivation to learn and perform lumbar stabilization ex.  Weak abdominals.    Plan:  Review stabilization exercises initiated this session.  Pt will continue with HEP as instructed.     Maudie Mercury PTA, [place]  [place] [phone]

## 2012-02-16 ENCOUNTER — Ambulatory Visit: Admission: RE | Admit: 2012-02-16 | Payer: Charity | Source: Ambulatory Visit

## 2012-02-19 ENCOUNTER — Ambulatory Visit
Admission: RE | Admit: 2012-02-19 | Discharge: 2012-02-19 | Disposition: A | Discharge status: Home / Self Care | Payer: Charity | Source: Ambulatory Visit

## 2012-02-19 NOTE — Progress Notes (Signed)
[facility]  [address]  [place], [place] [address]  [phone]    Daily Treatment Note    Patient:  [name] [name]     MRN#:  [id number]  Start of care:  01/16/2012              Medical Dx: Low back pain with H/O herniated disc  Therapy Dx: Increased pain, decreased strength, SI dysfunction, multiple joint pain  Referring MD: Pollyann Glen, MD.       Treatment Date: 02/19/12  Treatment Time: 1530 -1615      Therapeutic ex             mins         min      Visit number: 3  Change in Medications: No  Change in Medical Status: No    Precautions/Contraindications:None    FALLS RISK: No    Subjective:   Had a fever last Friday.  A little congestion still. At night more pain - so stretching  Before bed.  Tends to sit on sofa watching tv.  Computer use at table.  Up and moving around a lot.  Wants ex for UE (explained we can only do what script dictates)    Pain: 5/10  Patient is appropriate for Physical Therapy intervention at this time.  Pt is agreeable to participation in the therapy session.     Objective:    Re-instructed correct sit/Supine and correct squat to pick up item on floor.  supine hip flex stretch  ITB stretch  LTR  SKC      Reviewed lumbar stabilization ss follows:  Hook lying TVA 5-10 sec x 10  Hook lying w/ TVA and hip flex x 5 sec x 10, alternate LE  Hook lying w/ TVA with LAQ, alternate LE x 10  Sarhman level 1 x 10 - very challenging today - unable to maintain TVA and avoid Left LBP.  Hook lying with isometric hip add to recruit TVA  TVA w/SLR causes "burning" left LB so defer  Educcation: neutral spine not post pelvic tilt with stabilization ex, correct TVA, pt will need to continue with HEP long after PT is d/c to improve strength and flexibility.  Pt defers Heat to back  today    Treatment Activities  Educated the patient in HEP, Therapeutic ex     Assessment:  Pt with increased cues to keep neutral spine and TVA with higher level ex.  Weak  abdominals.    Plan:  Progress HEP next visit.  Pt will continue with HEP as instructed.     Maudie Mercury PTA, [place]  [place] [phone]

## 2012-02-23 ENCOUNTER — Ambulatory Visit: Admission: RE | Admit: 2012-02-23 | Discharge: 2012-02-23 | Payer: Charity | Source: Ambulatory Visit

## 2012-02-23 NOTE — Progress Notes (Signed)
Pt failed to show for scheduled appointment today.  Maudie Mercury PTA, [place]  [place] [phone]

## 2012-02-26 ENCOUNTER — Ambulatory Visit
Admission: RE | Admit: 2012-02-26 | Discharge: 2012-02-26 | Disposition: A | Discharge status: Home / Self Care | Payer: Charity | Source: Ambulatory Visit

## 2012-02-26 NOTE — Progress Notes (Signed)
[facility]  [address]  [place], [place] [address]  [phone]    Daily Treatment Note    Patient:  [name] [name]     MRN#:  [id number]  Start of care:  01/16/2012              Medical Dx: Low back pain with H/O herniated disc  Therapy Dx: Increased pain, decreased strength, SI dysfunction, multiple joint pain  Referring MD: Pollyann Glen, MD.      Time Calculation  PT Received On: 02/26/12  Start Time: 1200  Stop Time: 1230  Time Calculation (min): 30 min      Therapeutic ex      30 mins              min      Visit number: 6  Change in Medications: No  Change in Medical Status: No    Precautions/Contraindications:None    FALLS RISK: No    Subjective:   Doing OK. Continues to have pain off and on. Doing the exercises regularly.    Pain:4/10  Patient is appropriate for Physical Therapy intervention at this time.  Pt is agreeable to participation in the therapy session.     Objective:  Stretches: KTC,LTR, hamstrings, gastrocs, hip flexors, ITB, piriformis.  Reviewed and performed HEP.  Ex:  TVA, TVA with heel lifts, TVA with SLR x 10 ea.  Sahrmann's level I x 10 ea.  Added clam shell and hip abduction in side lying x 10 ea  Abdominal curls( [place]) x 10 ea  Alternate arm and leg raises in quadruped position x 10 ea. Copies of new exercises given.    Treatment Activities: New HEP added , copies given. Suggested pt to do some aerobic activity such as bicycling, elliptical 2-3 times a week. Start at 5-10 minutes and gradually increase time.  Educated the patient to role of physical therapy, plan of care, goals of therapy.    Assessment:  Knows her exercises, was able to do all exercises with minimal difficulty.    Plan:  Continue PT. Review and update exercises as needed.  Pt will continue with HEP as instructed.     Erin Sons, PT, DPT      [place] [phone]

## 2012-03-18 ENCOUNTER — Ambulatory Visit: Admission: RE | Admit: 2012-03-18 | Discharge: 2012-03-18 | Payer: Charity | Source: Ambulatory Visit

## 2012-03-19 NOTE — Final Progress Note (DC Note for stay less than 48 (Signed)
No show, did not call to cancel today's appointment 03/18/12. D/C PT. Too many no show/CTC.    Erin Sons, PT, DPT      [place] [phone]

## 2012-04-05 ENCOUNTER — Ambulatory Visit: Payer: Charity

## 2012-08-11 ENCOUNTER — Emergency Department: Payer: Charity

## 2012-08-11 ENCOUNTER — Emergency Department
Admission: EM | Admit: 2012-08-11 | Discharge: 2012-08-12 | Disposition: A | Discharge status: Home / Self Care | Payer: Charity | Attending: Emergency Medicine | Admitting: Emergency Medicine

## 2012-08-11 DIAGNOSIS — IMO0002 Reserved for concepts with insufficient information to code with codable children: Secondary | ICD-10-CM | POA: Insufficient documentation

## 2012-08-11 DIAGNOSIS — S6390XA Sprain of unspecified part of unspecified wrist and hand, initial encounter: Secondary | ICD-10-CM | POA: Insufficient documentation

## 2012-08-11 MED ORDER — TRAMADOL HCL 50 MG PO TABS
50.0000 mg | ORAL_TABLET | Freq: Four times a day (QID) | ORAL | Status: DC | PRN
Start: 2012-08-11 — End: 2012-09-24

## 2012-08-11 NOTE — ED Notes (Signed)
Left 3rd digit injury 1 week ago.  Has been taking meds for pain and swelling.  States still having pain and difficulty moving her finger.

## 2012-08-11 NOTE — ED Provider Notes (Signed)
Physician/Midlevel provider first contact with patient: 08/11/12 2242         EMERGENCY DEPARTMENT HISTORY AND PHYSICAL EXAM    Date Time: 08/11/2012 11:40 PM  Patient Name: [name] [name]  Attending MD Etheleen Mayhew               History of Presenting Illness     Chief Complaint:   Chief Complaint   Patient presents with   . Hand Pain       [name] [name] is a 27 y.o. female who presents with persistent worsening left 3rd finger pain after bumping it on a board a week ago. Reports that she did not feel pain immediately after bumping it but then it gradually worsened. Took ibuprofen and placed hand in hot water, with no relief. Associated with left hand soreness and 3rd finger "tightness." No other injury or complaints.     This history was obtained from the patient. They worsen with movement and improve with nothing.     Past Medical History     Past Medical History   Diagnosis Date   . Pancreatitis    . Colitis        Past Surgical History     Past Surgical History   Procedure Date   . Back surgery        Family History     No family history on file.    Social History     History     Social History   . Marital Status: Married     Spouse Name: N/A     Number of Children: N/A   . Years of Education: N/A     Social History Main Topics   . Smoking status: Never Smoker    . Smokeless tobacco: Not on file   . Alcohol Use:    . Drug Use:    . Sexually Active: Not on file     Other Topics Concern   . Not on file     Social History Narrative   . No narrative on file       Allergies     Allergies   Allergen Reactions   . Dilaudid (Hydromorphone Hcl)    . Morphine        Medications     No current facility-administered medications for this encounter.  Current outpatient prescriptions:traMADol (ULTRAM) 50 MG tablet, Take 1-2 tablets (50-100 mg total) by mouth every 6 (six) hours as needed for Pain., Disp: 15 tablet, Rfl: 0    Review of Systems     Positive: L 3rd finger pain and "tightness," L hand  soreness    All Other Systems Reviewed and Negative: Yes    Physical Exam     Constitutional: Vital signs reviewed. Well appearing.  Head:  Normocephalic, atraumatic  Eyes: No conjunctival injection. No discharge.  ENT:   Neck:   Respiratory/Chest:  Cardiovascular:   Abdomen:   Genitourinary:   UpperExtremity: left hand 3rd finger dry scab dorsal 3rd pip, from nvi  LowerExtremity:  Neurological: No focal motor deficits by observation. Speech normal. Memory normal.  Skin: Warm and dry. No rash.  Lymphatic:   Psychiatric: Normal affect. Normal concentration.    Diagnostic Study Results     Labs -   Labs Reviewed - No data to display    Radiologic Studies -   XR HAND LEFT PA LATERAL AND OBLIQUE    (Results Pending)         Clinical Course in the  Emergency Department/Medical Decision Making     I reviewed the vital signs, nursing notes, past medical history, past surgical history, family history and social history.    Vital Signs -   Patient Vitals for the past 12 hrs:   BP Temp Pulse Resp   08/11/12 2224 133/80 mmHg 98.4 F (36.9 C) 88  16        Pulse Oximetry Analysis - nl without need for supplemental oxygen      XRAY I personally reviewed and interpreted the patient's plain film radiology. No fracture,fb noted            Final diagnoses:   Finger sprain, initial encounter     New Prescriptions    TRAMADOL (ULTRAM) 50 MG TABLET    Take 1-2 tablets (50-100 mg total) by mouth every 6 (six) hours as needed for Pain.     ED Disposition     Discharge [name] [name] discharge to home/self care.    Condition at discharge: Stable            _______________________________    Attestations:    I was acting as a scribe for Carmon Sails, MD on Sopp,[name]  Treatment Team: Scribe: Joaquim Lai   I am the first provider for this patient and I personally performed the services documented. Treatment Team: Scribe: Joaquim Lai is scribing for me on Regino,[name]. This note accurately reflects work  and decisions made by me.  Carmon Sails, MD  _______________________________              Carmon Sails, MD  08/12/12 [phone]

## 2012-09-24 ENCOUNTER — Emergency Department: Payer: Charity

## 2012-09-24 ENCOUNTER — Emergency Department
Admission: EM | Admit: 2012-09-24 | Discharge: 2012-09-25 | Disposition: A | Discharge status: Home / Self Care | Payer: Charity | Attending: Emergency Medicine | Admitting: Emergency Medicine

## 2012-09-24 DIAGNOSIS — Z885 Allergy status to narcotic agent status: Secondary | ICD-10-CM | POA: Insufficient documentation

## 2012-09-24 DIAGNOSIS — N83209 Unspecified ovarian cyst, unspecified side: Secondary | ICD-10-CM | POA: Insufficient documentation

## 2012-09-24 LAB — HEPATIC FUNCTION PANEL
ALT: 63 U/L — ABNORMAL HIGH (ref 0–55)
AST (SGOT): 39 U/L — ABNORMAL HIGH (ref 5–34)
Albumin/Globulin Ratio: 1.1 (ref 0.9–2.2)
Albumin: 4.3 g/dL (ref 3.5–5.0)
Alkaline Phosphatase: 72 U/L (ref 40–150)
Bilirubin Direct: 0.1 mg/dL (ref 0.0–0.5)
Bilirubin Indirect: 0.4 mg/dL (ref 0.0–1.1)
Bilirubin, Total: 0.5 mg/dL (ref 0.2–1.2)
Globulin: 3.9 g/dL — ABNORMAL HIGH (ref 2.0–3.6)
Protein, Total: 8.2 g/dL (ref 6.0–8.3)

## 2012-09-24 LAB — URINALYSIS WITH MICROSCOPIC
Bilirubin, UA: NEGATIVE
Blood, UA: NEGATIVE
Glucose, UA: NEGATIVE
Ketones UA: NEGATIVE
Leukocyte Esterase, UA: NEGATIVE
Nitrite, UA: NEGATIVE
Protein, UR: NEGATIVE
Specific Gravity UA: 1.004 (ref 1.001–1.035)
Urine pH: 6.5 (ref 5.0–8.0)
Urobilinogen, UA: NORMAL mg/dL

## 2012-09-24 LAB — POCT PREGNANCY TEST, URINE HCG: POCT Pregnancy HCG Test, UR: NEGATIVE

## 2012-09-24 LAB — CBC AND DIFFERENTIAL
Basophils Absolute Automated: 0.04 (ref 0.00–0.20)
Basophils Automated: 0 %
Eosinophils Absolute Automated: 0.35 (ref 0.00–0.70)
Eosinophils Automated: 4 %
Hematocrit: 37.2 % (ref 37.0–47.0)
Hgb: 12.6 g/dL (ref 12.0–16.0)
Immature Granulocytes Absolute: 0.02
Immature Granulocytes: 0 %
Lymphocytes Absolute Automated: 2.94 (ref 0.50–4.40)
Lymphocytes Automated: 31 %
MCH: 25.7 pg — ABNORMAL LOW (ref 28.0–32.0)
MCHC: 33.9 g/dL (ref 32.0–36.0)
MCV: 75.8 fL — ABNORMAL LOW (ref 80.0–100.0)
MPV: 10.6 fL (ref 9.4–12.3)
Monocytes Absolute Automated: 0.69 (ref 0.00–1.20)
Monocytes: 7 %
Neutrophils Absolute: 5.33 (ref 1.80–8.10)
Neutrophils: 57 %
Nucleated RBC: 0 (ref 0–1)
Platelets: 269 (ref 140–400)
RBC: 4.91 (ref 4.20–5.40)
RDW: 15 % (ref 12–15)
WBC: 9.37 (ref 3.50–10.80)

## 2012-09-24 LAB — BASIC METABOLIC PANEL
BUN: 7 mg/dL (ref 7.0–19.0)
CO2: 21 — ABNORMAL LOW (ref 22–29)
Calcium: 9.7 mg/dL (ref 8.5–10.5)
Chloride: 107 (ref 98–107)
Creatinine: 0.8 mg/dL (ref 0.6–1.0)
Glucose: 96 mg/dL (ref 70–100)
Potassium: 4.1 (ref 3.5–5.1)
Sodium: 139 (ref 136–145)

## 2012-09-24 LAB — LIPASE: Lipase: 38 U/L (ref 8–78)

## 2012-09-24 LAB — GFR: EGFR: >60

## 2012-09-24 MED ORDER — IOHEXOL 240 MG/ML IJ SOLN
50.0000 mL | Freq: Once | INTRAMUSCULAR | Status: AC | PRN
Start: 2012-09-24 — End: 2012-09-24
  Administered 2012-09-24: 50 mL via ORAL

## 2012-09-24 MED ORDER — ONDANSETRON HCL 4 MG/2ML IJ SOLN
4.00 mg | Freq: Once | INTRAMUSCULAR | Status: AC
Start: 2012-09-24 — End: 2012-09-24
  Administered 2012-09-24: 4 mg via INTRAVENOUS
  Filled 2012-09-24: qty 2

## 2012-09-24 MED ORDER — DIPHENHYDRAMINE HCL 50 MG/ML IJ SOLN
INTRAMUSCULAR | Status: DC
Start: 2012-09-24 — End: 2012-09-25
  Filled 2012-09-24: qty 1

## 2012-09-24 MED ORDER — IOHEXOL 350 MG/ML IV SOLN
100.0000 mL | Freq: Once | INTRAVENOUS | Status: AC | PRN
Start: 2012-09-24 — End: 2012-09-24
  Administered 2012-09-24: 100 mL via INTRAVENOUS

## 2012-09-24 MED ORDER — FENTANYL CITRATE 0.05 MG/ML IJ SOLN
50.0000 ug | Freq: Once | INTRAMUSCULAR | Status: AC
Start: 2012-09-24 — End: 2012-09-24
  Administered 2012-09-24: 50 ug via INTRAVENOUS
  Filled 2012-09-24: qty 2

## 2012-09-24 MED ORDER — SODIUM CHLORIDE 0.9 % IV BOLUS
1000.00 mL | Freq: Once | INTRAVENOUS | Status: AC
Start: 2012-09-24 — End: 2012-09-24
  Administered 2012-09-24: 1000 mL via INTRAVENOUS

## 2012-09-24 MED ORDER — HYDROMORPHONE HCL PF 1 MG/ML IJ SOLN
0.5000 mg | Freq: Once | INTRAMUSCULAR | Status: AC
Start: 2012-09-24 — End: 2012-09-24
  Administered 2012-09-24: 0.5 mg via INTRAVENOUS
  Filled 2012-09-24: qty 1

## 2012-09-24 NOTE — ED Notes (Signed)
PO contrast completed, CT aware.

## 2012-09-24 NOTE — ED Provider Notes (Signed)
Physician/Midlevel provider first contact with patient: 09/24/12 1919         History     Chief Complaint   Patient presents with   . Abdominal Pain   . Pelvic Pain     Patient is a 27 y.o. female presenting with abdominal pain and pelvic pain. The history is provided by the patient.   Abdominal Pain  The primary symptoms of the illness include abdominal pain and nausea. The primary symptoms of the illness do not include fever, vomiting or diarrhea.   Pelvic Pain  Associated symptoms include abdominal pain and nausea. Pertinent negatives include no fever or vomiting.   pt c/o suprapubic and pelvic area pain since yesterday.  Also c/o generalized abd discomfort and bloating  Pt notes pain around bladder. Pain became worse around 2 am  Constant pain.  +nausea no vomiting no appetite no diarrhea but loose BM this morning  Pain radiates into rectum  similar pain in the past ~ 2012. diagnsoed with pancreatitis  Pt notes she has had abd pain in the past, diagnosed with IBS  Pt was on bentyl but pt stopped taking the medication due to lightheadedness  No abd surgeries    GI specialist: In [place]  Last colonoscopy:  2011          Past Medical History   Diagnosis Date   . Pancreatitis    . Colitis        Past Surgical History   Procedure Date   . Back surgery        History reviewed. No pertinent family history.    Social  History   Substance Use Topics   . Smoking status: Never Smoker    . Smokeless tobacco: Not on file   . Alcohol Use: No       .     Allergies   Allergen Reactions   . Dilaudid (Hydromorphone Hcl)    . Morphine        Current/Home Medications    OMEPRAZOLE PO    Take by mouth.    PREGABALIN (LYRICA PO)    Take by mouth.        Review of Systems   Constitutional: Negative for fever.   Gastrointestinal: Positive for nausea and abdominal pain. Negative for vomiting and diarrhea.   Genitourinary: Positive for pelvic pain.   All other systems reviewed and are negative.        Physical Exam    BP 120/81   Pulse 86  Temp 97.5 F (36.4 C)  Resp 18  Ht 1.626 m  Wt 78.926 kg  BMI 29.85 kg/m2  SpO2 100%  LMP 07/24/2012    Physical Exam   Nursing note and vitals reviewed.  Constitutional: She is oriented to person, place, and time. She appears well-developed and well-nourished. No distress.   HENT:   Head: Normocephalic and atraumatic.   Mouth/Throat: Oropharynx is clear and moist.   Eyes: Conjunctivae normal and EOM are normal. Pupils are equal, round, and reactive to light. Right eye exhibits no discharge. Left eye exhibits no discharge.   Neck: Normal range of motion and phonation normal. Neck supple. No tracheal deviation present.   Cardiovascular: Normal rate and regular rhythm.    Pulses:       Radial pulses are 2+ on the right side, and 2+ on the left side.   Pulmonary/Chest: Effort normal. No respiratory distress.   Abdominal: Soft. She exhibits no distension.   Genitourinary:  No discharge  No odor  No cmt  ttp over L lower pelvic area     Musculoskeletal: Normal range of motion.   Neurological: She is alert and oriented to person, place, and time. No cranial nerve deficit. GCS eye subscore is 4. GCS verbal subscore is 5. GCS motor subscore is 6.   Skin: Skin is warm and dry. She is not diaphoretic.   Psychiatric: She has a normal mood and affect. Her speech is normal.       MDM and ED Course     ED Medication Orders     None           MDM  Number of Diagnoses or Management Options     Amount and/or Complexity of Data Reviewed  Clinical lab tests: ordered and reviewed  Tests in the radiology section of CPT: ordered and reviewed  Obtain history from someone other than the patient: yes  Discuss the patient with other providers: yes    Risk of Complications, Morbidity, and/or Mortality  Presenting problems: moderate  Diagnostic procedures: moderate  Management options: moderate          Procedures    Clinical Impression & Disposition     Presumptive Diagnosis: abd pain    Treatment Plan: labs ct  meds    _______________________________    I reviewed the vital signs, nursing notes, past medical history, past surgical history, family history and social history.    Vital Signs - Patient Vitals for the past 12 hrs:   BP Temp Pulse Resp   09/24/12 1916 120/81 mmHg - 86  18    09/24/12 1842 125/96 mmHg 97.5 F (36.4 C) 108  16        Pulse Oximetry Analysis - Normal    Differential Diagnosis (not completely inclusive): colitis, abscess,     Laboratory results reviewed by ED PA-C: Yes  Radiologic study results reviewed by ED PA-C: Yes  Radiologic Studies Interpreted (viewed) by ED PA-C :No      Labs -     Results     Procedure Component Value Units Date/Time    Urine HCG POC [[id number]] Collected:09/24/12 1917     POCT QC Pass Updated:09/24/12 1917     POCT Pregnancy HCG Test, UR Negative      Comment:        Result:     Negative Value is Normal in Healthy Males or Healthy non-pregnant Females    Basic Metabolic Panel (BMP) [[id number]] Collected:09/24/12 1902    Specimen Information:Blood Updated:09/24/12 1914    CBC with Differential [[id number]] Collected:09/24/12 1902    Specimen Information:Blood / Blood Updated:09/24/12 1914    UA with Micro [[id number]] Collected:09/24/12 1902    Specimen Information:Urine Updated:09/24/12 1914          Radiologic Studies -   Radiology Results (24 Hour)     ** No Results found for the last 24 hours. **      .    Procedures:     Tommi Rumps, PA-C  7:25 PM                        Clinical Impression  Final diagnoses:   None        ED Disposition     None           New Prescriptions    No medications on file  Tommi Rumps, PA  09/24/12 1926    Tommi Rumps, PA  09/25/12 [phone]

## 2012-09-24 NOTE — ED Notes (Signed)
Pt still having pain, reports fentanyl iv medication is not working to relieve pain. Pt reports allergy to morphine and dilaudid is rash and itchiness. Pt denies respiratory problems with medication or throat closing and would like to try some dilaudid for pain relief. PA aware.

## 2012-09-24 NOTE — ED Notes (Signed)
Family at bedside. 

## 2012-09-24 NOTE — ED Notes (Signed)
Pt medicated with Fentanyl IVP (other 1mL left in bottle from first dose) per verbal order of Asha, PA for increased pain after going to the bathroom.

## 2012-09-24 NOTE — ED Notes (Signed)
Pt with lower abdominal pain radiating to mid abdomen and to pelvic area;vagina and rectumPt with nausea,dysuria.pt denies vaginal discharge.Pt denies bleeding.pt with last menses in July,states is irregular.Pt with bm today,but feels bloated.

## 2012-09-24 NOTE — ED Notes (Signed)
Pt back from CT.  MD at bedside speaking to pt.

## 2012-09-24 NOTE — ED Provider Notes (Signed)
Physician/Midlevel provider first contact with patient: 09/24/12 1919                 Clinical Impression:   Final diagnoses:   Ovarian cyst rupture       Disposition:    ED Disposition     Discharge Nur-E-Tabassum Eplin discharge to home/self care.    Condition at disposition: Stable                    Attending Note:     The patient was seen and examined by the mid-level (physician's assistant or nurse practitioner), or resident/fellow, and the plan of care was discussed with me.  I have reviewed and agree with the history, physical exam, assessments, and/or procedures performed.  I have personally seen, evaluated, and examined this patient.  Jonel Sick S. Kemisha Bonnette MD      Chief Complaint: [name] [name] is a 27 y.o. female with a history of pancreatitis and colitis presenting with lower abd pain radiating to mid abd, pelvic area, groin, and rectum persistent since yesterday, worsening today. Pt also reports nausea, dysuria, and feeling bloating. No vaginal discharge or bleeding. Last BM today. Pt has seen a GI before.       PCP: Pcp, Noneorunknown, MD      Pertinent Physical Exam Findings: TTP in the LLQ and suprapubic abd. Mild TTP in the epigastric region.    VS: BP 117/75  Pulse 62  Temp 97.5 F (36.4 C)  Resp 15  Ht 1.626 m  Wt 78.926 kg  BMI 29.85 kg/m2  SpO2 99%  LMP 07/24/2012      Differential: Colitis, UTI, pancreatitis, SBO, diverticulitis, ovarian pathology    Plan:  Labs  CT abd  Analgesic  IVF   Reassess and dispo after tx/results      3:00 AM  CT abd negative  US pelvis checked and neg for torsion but shows ovarian cyst rupture  Will [facility] home and recommend OP gyn f/u.  Referral provided  OP pmd f/u for repeat LFTs given slightly elevated; will need repeat testing.  Referral provided  Pt and husband understand and agree with plan      VS @ [facility]:  BP 117/75  Pulse 62  Temp 97.5 F (36.4 C)  Resp 15  Ht 1.626 m  Wt 78.926 kg  BMI 29.85 kg/m2  SpO2 99%  LMP  07/24/2012      ________________________________________________________________________    Amount and/or Complexity of Data Reviewed   First MD: Joyice Faster. Kathi Ludwig, MD   I am the first provider for this patient.      The past medical/surgical history, social history, family history, previous records, and medication list were reviewed by the ED attending (myself).    Clinical lab tests ordered and reviewed by myself: yes   All lab results interpreted by myself: yes   All radiology ordered and reviewed by myself: yes   All radiologic studies interpreted  by myself: yes   Independent visualization of images, tracings, or specimens: yes   Discuss the patient with other providers: yes   Decide to obtain previous medical records or to obtain history from someone other than the patient: yes   Review and summarize past medical records: yes   Nursing notes reviewed: yes     The vital signs and pulse ox were reviewed.    BP 117/75  Pulse 62  Temp 97.5 F (36.4 C)  Resp 15  Ht 1.626 m  Wt 78.926  kg  BMI 29.85 kg/m2  SpO2 99%  LMP 07/24/2012   Normal oxygen sat with no intervention needed.        I was acting as a Neurosurgeon for Lenny Pastel, MD on [employer]  Treatment Team: Scribe: Briscoe Deutscher   I am the first provider for this patient and I personally performed the services documented. Treatment Team: Scribe: Briscoe Deutscher is scribing for me on Kersten,NUR-E-TABASSUM. This note accurately reflects work and decisions made by me.  Lenny Pastel, MD      Lenny Pastel, MD  09/25/12 [phone]

## 2012-09-25 ENCOUNTER — Emergency Department: Payer: Charity

## 2012-09-25 MED ORDER — TRAMADOL HCL 50 MG PO TABS
50.0000 mg | ORAL_TABLET | Freq: Four times a day (QID) | ORAL | Status: DC | PRN
Start: 2012-09-25 — End: 2012-09-25
  Administered 2012-09-25: 50 mg via ORAL
  Filled 2012-09-25: qty 1

## 2012-09-25 MED ORDER — KETOROLAC TROMETHAMINE 30 MG/ML IJ SOLN
30.0000 mg | Freq: Once | INTRAMUSCULAR | Status: AC
Start: 2012-09-25 — End: 2012-09-25
  Administered 2012-09-25: 30 mg via INTRAVENOUS
  Filled 2012-09-25: qty 1

## 2012-09-25 MED ORDER — OXYCODONE-ACETAMINOPHEN 5-325 MG PO TABS
ORAL_TABLET | ORAL | Status: AC
Start: 2012-09-25 — End: 2012-10-05

## 2012-09-25 NOTE — ED Notes (Signed)
Dilaudid pain medication given. Pt denies itchiness, no signs of rash noted, sating 100% on RA. Call bell with patient made aware to notify RN of adverse reactions.

## 2012-09-25 NOTE — ED Notes (Signed)
Dr. Kathi Ludwig in room while discharged.

## 2012-09-25 NOTE — Discharge Instructions (Signed)
Your ultrasound shows an ovarian cyst; we suspect that is likely causing your pain  Also follow up with ob/gyn this week - see provided referral  Follow up with your pmd in 1-2 days for a check up as well.  Your liver function tests were abnormal and your Primary doctor should recheck this.    Take Advil for pain, percocet only as needed for severe pain    Return if worsening pain, fever vomiting or if any other concerns            Ovarian Cyst    You have been diagnosed with an ovarian cyst.    Ovarian cysts are a common problem in women with abdominal (belly) pain, pelvic pain, or cramping.    Ovarian cysts are a like small bubbles filled with fluid. These cysts, also known as ovarian follicles, form in the ovary during a normal menstrual cycle (period). During your cycle, an egg ripens inside the cyst to get ready for ovulation. Sometimes a follicle becomes large or many follicles form. This can cause pelvic pain or cramps.    If the cyst gets too big, it may break open. When this happens, it releases blood and fluid into the abdomen (belly). This causes sudden severe pain. This pain usually improves within a few hours without treatment. Some people need pain medication.    A few women have a condition called polycystic ovarian disease. In these women, multiple cysts form on both ovaries during the menstrual cycle. This causes irregular (unpredictable) menstrual periods. This condition is caused by hormones.    To diagnose polycystic ovarian disease, a doctor usually will examine a woman's pelvis and ask questions about her symptoms. If the doctor is unable to feel the cysts with his or her hands, an ultrasound may be necessary.    Ultrasound testing for cysts is rarely needed on an emergency basis. Instead, the test is usually done in the radiology department during regular weekday office hours. The medical staff will schedule this test in the next few days. If you prefer, you may see your regular  doctor to schedule the ultrasound.    The physician has determined that it is safe for you to go home.    You may need to return here or go to the nearest Emergency Department if your symptoms signal that you are having complications like infection or bleeding.    Be sure to follow up with your gynecologist or regular doctor in the next few days. Tell your doctor about this visit.   If you do not have a doctor or clinic to follow up with, make sure you tell the medical staff prior to leaving here today. We can help make these arrangements for you.    YOU SHOULD SEEK MEDICAL ATTENTION IMMEDIATELY, EITHER HERE OR AT THE NEAREST EMERGENCY DEPARTMENT, IF ANY OF THE FOLLOWING OCCURS:   Increasingly severe pain in the abdomen (belly), pelvis or back.   Increasingly large amounts of vaginal discharge or bleeding, or passage of large blood clots.   Fever, chills, nausea, vomiting.   Dizziness, lightheadedness, or passing out.      Sedating Medication (Edu)    You have been given a medication, or a prescription for a medication, that causes drowsiness or dizziness - PERCOCET. While taking this medication, DO NOT drive a car, operate machinery, or perform jobs that require you to be alert.    DO NOT drink alcoholic beverages while taking this medicine.    If  you become dizzy, sit or lie down at the first signs. You should be careful when going up and down stairs.    Never give this medication to others.    Keep this medication out of reach of children.    You should not take or save old medications. They should be thrown away when outdated.    Keep all medicines in a cool, dry place. DO NOT keep them in your bathroom medicine cabinet or in a cabinet above the stove.            [facility] Referral Line    You have been referred to a primary care doctor or a specialist for follow-up care. Please call the [facility] referral line and they will be able to help you find a physician in your area that you can follow up  with.    Phone: 1-855-IMG-DOCS or [phone]    Clinics: [facility] and [facility] Dept    COMMUNITY HEALTH CARE NETWORK OFFICES   The [facility] Network is a partnership of health professionals, physicians, hospitals and local government. It was formed to provide primary health services for low income, uninsured [place] residents who cannot afford primary medical care services for themselves and their families.    Applications are taken in person at the health centers. The list of enrollment requirements for the [facility] in [place] and Spanish are available.     [facility]  [address]  [place], [place] [address]  Phone: [phone]   FAX: [phone]  TTY: [phone]  Hours:   Monday and Tuesday: 11:00 a.m. - 7:30 p.m.  Wednesday, Thursday, Friday: 8:00 a.m. - 4:30 p.m.     [facility]  [address], [address]  [place], [place] [address]  Phone: [phone]  FAX: [phone]  TTY: [phone]  Hours:   Monday and Tuesday: 11:00 a.m. - 7:30 p.m.  Wednesday, Thursday, Friday: 8:00 a.m. - 4:30 p.m.     [facility]  [address], [address]  [place], [place] [address]  PHONE: [phone]   FAX: [phone]  TTY: [phone]    Monday and Tuesday: 11:00 a.m. - 7:30 p.m.  Wednesday,Thursday, Friday: 8:00 a.m. - 4:30 p.m.    --------------------------------------------------------  --------------------------------------------------------  DOCUMENTATION NEEDED FOR ENROLLMENT INTO  THE COMMUNITY HEALTH CARE NETWORK    Identification for [facility] family member: (Please bring ONE of the following)  - Social Security Card  - Scientist, forensic  - Baptismal Record  - School Report Card  - WIC Record  - Photo ID Card  If you are not the biological parent of one or more minors living in your home, please bring proof of adoption, guardianship or foster parent status.    Intent to remain in [facility] for [facility] family member: (Please  bring IF applicable to you and/or any family member)  - Passport  - Visa  - INS documentation    Depending on your situation, this documentation may or may not be required. If you have a passport, visa or any documentation from the INS regarding your residency status, please bring it with you.  Proof of 39-Month Residence in [place]: (Please bring ONE of the following)   Barista with your name and address   Mortgage or tax bill   Letter from landlord   Letter from homeless shelter   Notarized statement from person with whom you are living  PLUS a utility bill with the name and address of the person with whom you are living   Proof of Income: (for [employer] employed household member, for [employer] job held)   Most recent income tax return    AND one of the following:   Pay stubs for the past month   Income and Engineer, manufacturing systems form (provided by enrollment office)   Copy of social security and/or SSI award letter, General Relief check, proof of TANF payment, copy of pension payments, court order regarding alimony and/or child support payments to you   If you are currently unemployed and supported by a friend or family member, bring in a notarized statement from the person supporting you stating how long they have helped you and how long they plan to continue supporting you.    Proof of Insurance: (for [employer] household member)   Proof that you are ineligible for Medicaid, Medallion or FAMIS   If employed, please have your employer complete the income and insurance verification form    Other Required Documentation:   Other: __________________________   Two (2) months of most recent bank statements, both checking and savings   Copies of on-going monthly bills, including rent/mortgage, utilities, phones, car payments, etc.

## 2012-09-27 ENCOUNTER — Ambulatory Visit (INDEPENDENT_AMBULATORY_CARE_PROVIDER_SITE_OTHER): Payer: Charity

## 2012-09-27 ENCOUNTER — Encounter (INDEPENDENT_AMBULATORY_CARE_PROVIDER_SITE_OTHER): Payer: Self-pay

## 2012-09-27 NOTE — Progress Notes (Signed)
Pt went to ED on 09/24/12 for low abdominal pain radiating to mid abdomen, vagina and rectum; nausea, dysuria. Pt dx'd with ovarian cyst rupture and Babcock'd home with Percocet. Consulted with Dr. Donalda Ewings who agreed to have patient come in for NGYN w/in 2 weeks. Informed financial counselor, Evaristo Bury, of patient plan. Cephus Shelling, RN

## 2012-10-08 ENCOUNTER — Encounter (INDEPENDENT_AMBULATORY_CARE_PROVIDER_SITE_OTHER): Payer: Charity | Admitting: Obstetrics & Gynecology

## 2012-10-09 ENCOUNTER — Ambulatory Visit: Payer: Charity | Attending: Obstetrics and Gynecology | Admitting: Obstetrics and Gynecology

## 2012-10-09 VITALS — BP 115/59 | HR 98 | Ht 64.5 in | Wt 171.0 lb

## 2012-10-09 DIAGNOSIS — N949 Unspecified condition associated with female genital organs and menstrual cycle: Secondary | ICD-10-CM | POA: Insufficient documentation

## 2012-10-09 MED ORDER — NORGESTIMATE-ETH ESTRADIOL 0.25-35 MG-MCG PO TABS
1.0000 | ORAL_TABLET | Freq: Every day | ORAL | Status: DC
Start: 2012-10-09 — End: 2018-05-22

## 2012-10-09 NOTE — Progress Notes (Signed)
New GYN    HPI: Patient is a 27yo G0 presenting for ER follow-up.  Patient was seen in the ED on 9/16 for left lower abdominal pain associated with dyschezia, dysuria, and nausea.  Only remarkable laboratories was a transaminitis (AST/ALT 39/63); UA negative, CBC and remainder of serum chemistries WNL.  Patient also underwent CT A/P which was normal and a pelvic ultrasound with only abnormality as echogenic fluid in the cul-de-sac which was read as possible ruptured ovarian cyst despite absence of cysts on the same sonogram.  Patient was discharged home with referral for follow-up regarding her transaminitis on labs.    Today patient states that she still has mid-lower and lower-left abdominal "tightness" that feels like a pulled muscle.  She states that this type of pain has persisted over the last 3 weeks, still associated with dyschezia and pelvic discomfort with urination.  Upon further questioning, patient also endorses dyspareunia with deep penetration.  She has a notable history of PCOS, colitis, and IBS.  Last saw a GI doctor in [place] in 2013, last C-scope in 2011 which was WNL.  She denies fevers/chills, unplanned weight loss, hematochezia.    POBHx: nulligravida  PGynHx: 11/q1-17months/5-7d, LMP 10/04/12; no h/o STIs; no h/o abnormal Paps (last 2012 WNL); PCOS as above (previously on OCPs from 2010-2011 with good regulation of menses, self-discontinued; Metformin also tried but not tolerated)  PMH: colitis/IBS as above; pancreatitis in 2012 presumably 2/2 a passed gallstone (not seen on imaging at the time per patient)  PSH: back sugery for herniated L5-S1 disc (2009); Lasix eye surgery 2012  Meds: Omeprazole 40mg  qdaily; Pregabalin 50mg  qHS; Flonase qdaily  All: Dilaudid (though given in ED on most recent visit w/o issue), morphine (rash, itching)  SH: neg x3  FH: Father (deceased): HTN, HLD, heart disease; mother- HLD  ROS: as above    PE: VS: BP 115/59  Pulse 98  Ht 5' 4.5" (1.638 m)  Wt 171 lb  (77.565 kg)  BMI 28.91 kg/m2  LMP 10/04/2012  Gen: NAD  CV: RRR  Pulm: CTAB  Abd: soft, obese, mild diffuse tenderness throughout, worse in epigastrum and mid-lower abdomen, no r/r/g  Ext: no edema/calf tenderness  Pelvic: NEFG, vagina pink/moist/rugated, mild-mod blood in vault c/w menses, nulliparous cervix without polyps or other visible abnormalities; bimanual: generalized mid-line, right and left discomfort with palpation, no palpable masses, no CMT    Study Result       HISTORY: Menses are irregular. Last menses was in July. Patient has   pain.   PROCEDURE: Patient was evaluated with real-time transabdominal and   transvaginal grayscale imaging. Endovaginal examination was utilized to   better assess the uterus and ovaries. Power and spectral Doppler imaging   of the ovaries was also performed.   FINDINGS: The uterus measures 9.4 x 4.3 x 5.2 cm. Endometrial double   thickness is 10 mm. There are no fibroids. Echogenic fluid is present in   the cul-de-sac. The right ovary measures 3.9 x 2.6 x 2.5 cm. Power flow   along with Doppler arterial and venous waveforms were obtained from the   right ovary. The left ovary measures 3.9 x 2.5 x 2.5 cm. Power flow   along with Doppler arterial and venous waveforms were obtained from the   right ovary. No ovarian cysts identified. No dilated tubes are   identified.   IMPRESSION:   1. Flow present in both ovaries. Presence of flow does not exclude   ovarian torsion.  2. Echogenic fluid in the pelvis. This may be blood secondary to a   ruptured cyst. No definite stenosis is identified on this study.   Purulent fluid is also in the differential.      A/P: 26yo G0 with h/o IBS/colitis and PCOS presenting for ED follow-up for abdominal pain with mild transaminitis and mild free fluid in the pelvis, no GYN abnormalities noted  -AVSS  -Reviewed ultrasound results with patient and partner.  Discussed only noted "abnormality" was fluid in the pelvis without evidence of  uterine/ovarian/other adnexal pathology.  -If in fact from ruptured cyst, discussed that OCPs may be helpful in preventing formation of further cysts and will also help to regulate periods.  Patient desires fertility likely in the next year or so.  She is willing to attempt a 3 month trial of OCPs to determine whether this can improve symptoms, and also in the meantime patient plans on losing weight prior to attempting to conceive.  Discussed that symptoms could possibly be related to a GYN cause (cysts vs. Endometriosis, etc), but that this pain is more consistent with an exacerbation of her IBS/colitis, especially in the setting of abnormal liver function tests.  Contact card for [facility] provided so that patient may attempt to establish primary care for management of these issues.  -Counseled on ample use of lubrication, position changes to improve dyspareunia symptoms  -Plan for RTC in 3 months for re-evaluation of symptoms    D/w Dr. Allyn Kenner  Garvin Fila, MD, PGY-2

## 2012-10-10 ENCOUNTER — Encounter (INDEPENDENT_AMBULATORY_CARE_PROVIDER_SITE_OTHER): Payer: Self-pay | Admitting: Obstetrics and Gynecology

## 2012-10-10 ENCOUNTER — Encounter (INDEPENDENT_AMBULATORY_CARE_PROVIDER_SITE_OTHER): Payer: Self-pay | Admitting: Neurological Surgery

## 2012-10-10 ENCOUNTER — Ambulatory Visit (INDEPENDENT_AMBULATORY_CARE_PROVIDER_SITE_OTHER): Payer: Charity | Admitting: Neurological Surgery

## 2012-10-10 VITALS — BP 120/82 | HR 94 | Wt 171.0 lb

## 2012-10-10 NOTE — Progress Notes (Signed)
I have reviewed the notes, assessments, and/or procedures performed by Dr. Jean Rosenthal, I concur with her/his documentation of [name] [name].

## 2012-10-10 NOTE — Patient Instructions (Signed)
F/U 4-6 weeks after MRI Lumbar WO Contrast and PT

## 2012-10-11 NOTE — Progress Notes (Signed)
Service Date: 10/10/2012     Patient Type: O     PHYSICIAN/PROVIDER: Marlaine Hind MD     [name] [name] is 27 year old woman with history of debilitating low back pain  and bilateral leg pain.  The back pain constitutes 80% of her pain syndrome  and leg pain 20%.  Pain is 8/10 and limits her from carrying out many of  the activities of daily living.  She certainly states that she cannot be  active and that her sedentary lifestyle has caused her to pick up some  weight.  She had L5-S1 microdiskectomy in 2009 for symptoms of left leg  pain and low back pain from 2006 her symptoms briefly improved for 6  months, but then they returned.  Her symptoms are worse with standing,  sitting appears to be the best and lying down is intermediate.  The pain  radiates to the heel of the bottom of the foot, predominantly on the left.   She admits the numbness in the outside and bottom of the foot in the S1  distribution.     PAST MEDICAL HISTORY:  Pancreatitis, colitis.     PAST SURGICAL HISTORY:  Back surgery.  She is not a smoker.     MEDICATIONS:   Lyrica, .  She has tried steroid injections in the past without  success and she does not wish any further.  She has had limited PT.     IMAGING:   She has an MRI from 2013, which demonstrated degenerative disk disease at  L5-S1 with a bulging disk at L5-S1; however, recent MRI has not been  performed.     REVIEW OF SYSTEMS:   A 10-point review of systems was conducted and is negative unless as in  HPI.     PHYSICAL EXAMINATION:  LUNGS:   Clear to auscultation, positive breath sounds.  HEART:  Regular rate and rhythm.  No murmurs, gallops, rubs.  ABDOMEN:  Soft, nontender.  NEUROLOGIC:  Cranial nerves II-XII intact.  Pupils equal, reactive to  light, 3 to 2.  No papilledema.  Cranial nerve V intact to light touch.   Face symmetric.  Tongue midline.  Speech fluent and coherent.  Hearing  intact to finger rub.  NECK:  Soft, supple, full range of motion and 5/5 strength deltoid,  biceps,  triceps, and intrinsics.  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   In the lower extremities, psoas, quadriceps is 5/5 on the right side.   Plantar flexion, dorsiflexion 5/5 on the left side, dorsiflexion is 5/5,  but plantar flexion is 4/5.  Sensation is diminished in the bottom of the  foot and lateral foot to light touch, pinprick, and joint perception.   Otherwise, intact to light touch, pinprick, and joint perception  throughout.  Deep tendon reflexes +2 for the knee jerk, ankle jerk is 2 on  the right, 1 on the left, biceps, brachioradialis, and triceps, +2.     General Appearance:    Alert, cooperative, no distress, appears stated age   Head:    Normocephalic, atraumatic   Eyes:    PERRL, conjunctiva/corneas clear, sclera anicteric   Nose:   Nares normal, septum midline, mucosa normal, no drainage    or sinus tenderness   Throat:   Lips, mucosa, and tongue normal; Oropharynx clear, no exudate   Neck:   Supple, symmetrical, trachea midline, no adenopathy;        thyroid:  No enlargement/tenderness/nodules;  Carotids 2+ Bilaterally, no JVD  Lungs:     Clear to auscultation bilaterally, respirations unlabored   Chest wall:    No tenderness or deformity   Heart:    Regular rate and rhythm, S1 and S2 normal, no murmur, no rub or gallop   Abdomen:     Soft, non-tender, bowel sounds active all four quadrants,     no masses, no organomegaly   Extremities:   Extremities warm, atraumatic, no clubbing, cyanosis or edema   Pulses:   2+ and symmetric all extremities, including radial and DP   Skin:   Skin color, texture, turgor normal, no rashes or lesions       IMPRESSION:   The patient has degenerative disk disease, L5-S1 with symptoms of axial  pain predominantly with a left S1 radiculopathy.     PLAN:  The patient will be sent for a local therapy 2 times a week for 6 weeks.   She will have an updated MRI.  She will follow up with me in 4 to 6 weeks.   If the conservative management fails to resolve  her symptoms, which has not  done in 5 years, then we will have a conversation about possible surgical  options. Based on her old MRI, the most likely culprit appears to be L5-S1  level and the most likely approach to improve this if surgery was necessary  would be a L5-S1 TLIF but I will await the findings of the MRI before  making this recommendation in the event that surgery is necessary.           JFH

## 2012-10-22 ENCOUNTER — Ambulatory Visit
Admission: RE | Admit: 2012-10-22 | Discharge: 2012-10-22 | Disposition: A | Discharge status: Home / Self Care | Payer: Charity | Source: Ambulatory Visit | Attending: Neurological Surgery | Admitting: Neurological Surgery

## 2012-10-22 DIAGNOSIS — M47817 Spondylosis without myelopathy or radiculopathy, lumbosacral region: Secondary | ICD-10-CM | POA: Insufficient documentation

## 2012-11-01 ENCOUNTER — Ambulatory Visit
Admission: RE | Admit: 2012-11-01 | Discharge: 2012-11-01 | Disposition: A | Discharge status: Home / Self Care | Payer: Charity | Source: Ambulatory Visit | Attending: Neurological Surgery | Admitting: Neurological Surgery

## 2012-11-01 DIAGNOSIS — IMO0002 Reserved for concepts with insufficient information to code with codable children: Secondary | ICD-10-CM | POA: Insufficient documentation

## 2012-11-01 NOTE — PT Eval Note (Signed)
Physical Therapy Spine Evaluation          [facility]   [address]   [place], [place] [address]   [phone]     Patient:  [name] [name]                                                   GBT#:[id number]      Start of Care:    11/01/2012                                                 No of Visits:    Time of treatment:  Start Time : 1330                               Stop Time: 1430    Evaluation    Therapeutic Ex  15 mins         Precautions/Contraindications: L5-S1 discectomy in 2009.  FALLS RISK: No    Diagnosis: L5-S1 DDD with S1 Radiculopathy and axial pain        Referring MD: Marlaine Hind, MD    History:  Low back pain radiating to left LE, started radiating to right LE also. C/O pain in both heels, numbness in both LEs. Reports decreased sensation in left LE. Bending, sitting, standing, walking all increase pain. Able to do light work at home. Mother and husband help with vacuum, laundry etc.   Pt was seen in PT in January- February 2014, received 6 visits, treatments were cancelled because of too many no shows. Pt reports that she has been doing the exercises daily.    Social:   Married, lives with family. Mother and husband help with vacuum and laundry.  Prior level of function: Light work due to back pain since 2009.  Cognition: Oriented to time, place and person.  Functional Limitations:  All activities are painful, sitting, standing, walking increase pain.   Posture: slouched, rounded shoulders, increased lumbar lordosis.  Pelvic alignment: ROR, right anterior innominate.  Gait: Decreased transverse and sagittal plane motion.  Palpation: Tenderness over L5-S1 paraspinals, SI joint line bilaterally.  Pain: 6-10/10.     ROM L/T SPINE C SPINE     Forward Bending 40 NT     Backward Bending 10      Side Bending Left 20 pain on left      Side Bending Right 20 pain on left       Rotation Left NT      Rotation Right NT          Strength R L     Iliopsoas (L2) 5 5     Quadriceps (L3) 5 5     Tibialis Anterior (L4) 5 5     EHL (L5) 5 5     Peroneus L & B (S1) 5 5     Gluts 5 5     Hamstrings (S1,2) 5 4+     Abdominals      weak      Back Extensors weak      Heel/Toe Walk NT  Special Tests:   Slump: NT  Sciatic nerve: -ve   Femoral nerve: -ve  SLR: -ve mild tight hamstrings  FABER: -ve  LLD: No     Flexibility: Myofascial tightness of left gastrocs, hamstrings, right more than left, QL bilaterally, gluteals.  Sensation and DTR: Patellar 2+ bilaterally, Achilles right 2+, left unable to elicit.    Treatment Activities: METs to correct pelvic alignment. HEP reviewed, added hamstrings, gastrocs and gluteal stretches, copies given. Ex: KTC, LTR, hamstrings, gastrocs, gluteal stretches. Strengthening: TVA with bent leg raises, TVA with knee extension, TVA with SLR.McGill abdominal curls, clam shell and hip abduction in side lying, alternate arm and leg raises in quadruped position.  Educated the patient to role of physical therapy, plan of care, goals of therapy. Pt has an elliptical unit at home suggested her to use it, gradually increase to 30 minutes 2-3 times a week.     Assessment:  27 year old female with H/O L5-S1 discectomy in 2009, with low back pain radiating to both LEs seen in PT for evaluation and treatment.   Oswestry: 46%    PT Diagnosis: Increased pain, decreased strength, myofascial tightness, SI dysfunction.  Goal potential/prognosis: Good-fair  Goals: Independent HEP and symptom management.  Patient Education: HEP  Patient Goal: lessen back pain, improve core and back strength.  Treatment Given: Evaluation, METs to correct pelvic alignment, review and update HEP.  Plan:   Pt will be seen 2 x week for 6 weeks for manual therapy, therapeutic exercises, HEP and modalities as needed.  Pt will continue with HEP as instructed.       Erin Sons, PT, DPT      [place] [phone]

## 2012-11-13 ENCOUNTER — Ambulatory Visit
Admission: RE | Admit: 2012-11-13 | Discharge: 2012-11-13 | Disposition: A | Discharge status: Home / Self Care | Payer: Charity | Source: Ambulatory Visit | Attending: Neurological Surgery | Admitting: Neurological Surgery

## 2012-11-13 NOTE — Progress Notes (Signed)
Pt failed to attend scheduled therapy session today.  Pt will be contacted and reminded of no call/cancelaltion and d/c policy.    Maudie Mercury PTA, [place]  [place] [phone]

## 2012-11-25 ENCOUNTER — Ambulatory Visit: Admission: RE | Admit: 2012-11-25 | Payer: Charity | Source: Ambulatory Visit

## 2012-11-27 ENCOUNTER — Ambulatory Visit: Payer: Charity | Attending: Neurological Surgery

## 2012-11-27 ENCOUNTER — Ambulatory Visit (INDEPENDENT_AMBULATORY_CARE_PROVIDER_SITE_OTHER): Payer: Charity | Admitting: Neurological Surgery

## 2012-11-27 ENCOUNTER — Encounter (INDEPENDENT_AMBULATORY_CARE_PROVIDER_SITE_OTHER): Payer: Self-pay | Admitting: Neurological Surgery

## 2012-11-27 VITALS — BP 116/82 | HR 97 | Wt 170.0 lb

## 2012-11-27 DIAGNOSIS — Z01818 Encounter for other preprocedural examination: Secondary | ICD-10-CM | POA: Insufficient documentation

## 2012-11-27 LAB — PT AND APTT
PT INR: 1 (ref 0.9–1.1)
PT: 13.5 (ref 12.6–15.0)
PTT: 27 (ref 23–37)

## 2012-11-27 LAB — BASIC METABOLIC PANEL
BUN: 12 mg/dL (ref 6.0–20.0)
CO2: 24 mEq/L (ref 21–30)
Calcium: 9.8 mg/dL (ref 8.5–10.5)
Chloride: 104 mEq/L (ref 96–109)
Creatinine: 0.8 mg/dL (ref 0.4–1.5)
Glucose: 70 mg/dL (ref 70–100)
Potassium: 4.5 mEq/L (ref 3.5–5.3)
Sodium: 140 mEq/L (ref 135–146)

## 2012-11-27 LAB — HEMOLYSIS INDEX: Hemolysis Index: 1 (ref 0–9)

## 2012-11-27 LAB — CBC
Hematocrit: 38.2 % (ref 37.0–47.0)
Hgb: 12.4 g/dL (ref 12.0–16.0)
MCH: 25.5 pg — ABNORMAL LOW (ref 28.0–32.0)
MCHC: 32.5 g/dL (ref 32.0–36.0)
MCV: 78.4 fL — ABNORMAL LOW (ref 80.0–100.0)
MPV: 10.9 fL (ref 9.4–12.3)
Nucleated RBC: 0 (ref 0–1)
Platelets: 288 (ref 140–400)
RBC: 4.87 (ref 4.20–5.40)
RDW: 15 % (ref 12–15)
WBC: 8.55 (ref 3.50–10.80)

## 2012-11-27 LAB — GFR: EGFR: >60

## 2012-11-28 ENCOUNTER — Ambulatory Visit
Admission: RE | Admit: 2012-11-28 | Discharge: 2012-11-28 | Disposition: A | Discharge status: Home / Self Care | Payer: Charity | Source: Ambulatory Visit

## 2012-11-28 LAB — ECG 12-LEAD
Atrial Rate: 82 {beats}/min
P Axis: 61 degrees
P-R Interval: 132 ms
Q-T Interval: 384 ms
QRS Duration: 84 ms
QTC Calculation (Bezet): 448 ms
R Axis: 35 degrees
T Axis: 28 degrees
Ventricular Rate: 82 {beats}/min

## 2012-11-28 NOTE — Progress Notes (Signed)
We will reinforce cancellation and no show policy if patient does not come to PT or call 24 hours ahead to cancel the appointment. Pt has a history of too many no shows and cancellations.    Erin Sons, PT, DPT      [place] [phone]

## 2012-11-28 NOTE — Progress Notes (Signed)
[facility]  [address]  [place], [place] [address]  [phone]    Daily Treatment Note    Patient:  [name] [name]     MRN#:  [id number]  Start of care:  11/01/12     Pt arrived today to say she has surgery set for 12/12/12.  She reports she has ben doing some exercises, does not want to review HEP.  Wonders if she should ad new ex.   Informed pt I need to go over her current HEP to see how she performs them and  Determine if changes ned to be done.  She deferred.  Pt states she came today because she will need PT in ~3 months after surgery per Dr. Deloria Lair and she wants to leave things on good terms with the PT dept.    Reminded pt that she has repeatedly cancelled or not shown for appointments and the  Policy is 24 hour notice to allow another pt to use this time.    No treatment today.  Pt was seen for evaluation  11/01/12 and this is the first visit since.    D/C PT per pt request. Maudie Mercury PTA, CLT-LANA  [place] [phone]

## 2012-11-28 NOTE — Progress Notes (Signed)
Service Date: 11/27/2012     Patient Type: O     PHYSICIAN/PROVIDER: Marlaine Hind MD     [name] [name] presents for followup.  She is a 27 year old woman with  debilitating lower back pain that radiates to the bilateral lower  extremities.  Back pain constitutes 80% of her pain syndrome, leg pain 20%.   Her leg pain is predominantly to the left leg.  Since her last visit, she  has begun to experience numbness in the left big toe, in addition to the plantar surface of her foot.    Pain is 6/10.  She has a prior surgical history of L5-S1 microdiskectomy in 2009 for left leg  pain and low back pain since 2006.  She presents today with a new MRI.  On  review of the patient's MRI, she has a disk herniation centrally with  degenerative disk disease at L5-S1.  There is some pressure on the exiting  nerve root and the traversing nerve root on the left.  Evidence of the  prior microdiskectomy is visible.     PHYSICAL EXAMINATION:  VITAL SIGNS:  Blood pressure is 116/82, pulse is 97.  LUNGS:  Clear to auscultation, positive bilateral breath sounds.  HEART:  Regular rhythm, no murmurs, gallops or rubs.  ABDOMEN:  Soft, nontender, positive bowel sounds.    HEENT:  Cranial nerves II through XII intact.  Pupils equal, reactive to  light, 3 to 2.  No papilledema.  Cranial nerve V intact to light touch.   Face symmetric.  Tongue midline.  Speech fluent and coherent.  Hearing  intact to finger rub.   NECK:  Soft, supple, full range of motion.   EXTREMITIES:  5/5 strength deltoid, biceps, triceps, intrinsics.   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  In the lower extremities, psoas  and quadriceps were 5/5 on the right and left.  On the patient's right  side, plantar flexion, dorsiflexion 5/5 on her left side.  Dorsiflexion was  5/5, but plantar flexion was only 4/5.  Sensation is diminished in the  bottom of the foot and lateral foot to pinprick and joint perception.   Otherwise, intact to light touch, pinprick, and joint  perception  throughout.  Deep tendon reflexes +2 for the knee jerk.  Ankle jerk was 2  on the right, 1 on the left.  Deep tendon reflexes in the upper extremities  were +2 for the biceps, brachioradialis, and triceps.     General Appearance:    Alert, cooperative, no distress, appears stated age   Head:    Normocephalic, atraumatic   Eyes:    PERRL, conjunctiva/corneas clear, sclera anicteric   Nose:   Nares normal, septum midline, mucosa normal, no drainage    or sinus tenderness   Throat:   Lips, mucosa, and tongue normal; Oropharynx clear, no exudate   Neck:   Supple, symmetrical, trachea midline, no adenopathy;        thyroid:  No enlargement/tenderness/nodules;  Carotids 2+ Bilaterally, no JVD   Lungs:     Clear to auscultation bilaterally, respirations unlabored   Chest wall:    No tenderness or deformity   Heart:    Regular rate and rhythm, S1 and S2 normal, no murmur, no rub or gallop   Abdomen:     Soft, non-tender, bowel sounds active all four quadrants,     no masses, no organomegaly   Extremities:   Extremities warm, atraumatic, no clubbing, cyanosis or edema   Pulses:  2+ and symmetric all extremities, including radial and DP   Skin:   Skin color, texture, turgor normal, no rashes or lesions       IMPRESSION:  The patient has degenerative disk disease, L5-S1 with axial pain   and left S1  radicular pain secondary to degenerative disk disease and disk herniation.     PLAN:  Given the fact that the patient has back and leg pain, I would recommend a  transforaminal lumbar interbody fusion left-sided approach with  posterolateral fusion.  Risks, benefits were discussed in detail with the  patient and her husband, who is a physician, and they have agreed to  proceed.  Models were brought out to describe the procedure as well in  order to facilitate their understanding of the procedure. Consents were signed and pre-op labs were ordered           [facility]

## 2012-11-28 NOTE — Pre-Procedure Instructions (Signed)
Labs 11/27/12 rev. R/o mrsa, ekg pending

## 2012-11-29 NOTE — Pre-Procedure Instructions (Signed)
ekg 11/27/12 re. R/o mrsa-/mssa+ rev/faxed to Dr Deloria Lair.

## 2012-11-30 DIAGNOSIS — IMO0001 Reserved for inherently not codable concepts without codable children: Secondary | ICD-10-CM

## 2012-11-30 HISTORY — DX: Reserved for inherently not codable concepts without codable children: IMO0001

## 2012-12-02 ENCOUNTER — Ambulatory Visit: Payer: Charity

## 2012-12-06 ENCOUNTER — Ambulatory Visit: Payer: Charity

## 2012-12-09 ENCOUNTER — Ambulatory Visit: Payer: Charity

## 2012-12-09 HISTORY — PX: LUMBAR FUSION: SHX111

## 2012-12-11 ENCOUNTER — Other Ambulatory Visit: Payer: Self-pay

## 2012-12-11 ENCOUNTER — Ambulatory Visit: Payer: Self-pay

## 2012-12-11 NOTE — Pre-Procedure Instructions (Signed)
   Need H&P   Pt preop lab; ekg are in EPIC   Pt states is coming to [facility] for preop T&S on 12-12-12

## 2012-12-12 ENCOUNTER — Ambulatory Visit: Payer: Charity

## 2012-12-12 ENCOUNTER — Ambulatory Visit: Payer: Charity | Attending: Neurological Surgery

## 2012-12-12 DIAGNOSIS — Z01818 Encounter for other preprocedural examination: Secondary | ICD-10-CM | POA: Insufficient documentation

## 2012-12-12 LAB — TYPE AND SCREEN
AB Screen Gel: NEGATIVE
ABO Rh: B POS

## 2012-12-13 ENCOUNTER — Encounter: Payer: Self-pay | Admitting: Anesthesiology

## 2012-12-13 ENCOUNTER — Inpatient Hospital Stay: Payer: Charity

## 2012-12-13 ENCOUNTER — Inpatient Hospital Stay: Payer: Self-pay | Admitting: Neurological Surgery

## 2012-12-13 ENCOUNTER — Inpatient Hospital Stay: Payer: Charity | Admitting: Anesthesiology

## 2012-12-13 ENCOUNTER — Encounter
Admission: RE | Disposition: A | Discharge status: Home / Self Care | Payer: Self-pay | Source: Ambulatory Visit | Attending: Neurological Surgery

## 2012-12-13 ENCOUNTER — Inpatient Hospital Stay
Admission: RE | Admit: 2012-12-13 | Discharge: 2012-12-16 | DRG: 460 | Disposition: A | Discharge status: Home / Self Care | Payer: Charity | Source: Ambulatory Visit | Attending: Neurological Surgery | Admitting: Neurological Surgery

## 2012-12-13 DIAGNOSIS — M51379 Other intervertebral disc degeneration, lumbosacral region without mention of lumbar back pain or lower extremity pain: Principal | ICD-10-CM | POA: Diagnosis present

## 2012-12-13 DIAGNOSIS — K5900 Constipation, unspecified: Secondary | ICD-10-CM | POA: Diagnosis not present

## 2012-12-13 DIAGNOSIS — R21 Rash and other nonspecific skin eruption: Secondary | ICD-10-CM | POA: Diagnosis not present

## 2012-12-13 DIAGNOSIS — R5082 Postprocedural fever: Secondary | ICD-10-CM | POA: Diagnosis not present

## 2012-12-13 DIAGNOSIS — J309 Allergic rhinitis, unspecified: Secondary | ICD-10-CM | POA: Diagnosis present

## 2012-12-13 DIAGNOSIS — R509 Fever, unspecified: Secondary | ICD-10-CM

## 2012-12-13 DIAGNOSIS — Z885 Allergy status to narcotic agent status: Secondary | ICD-10-CM

## 2012-12-13 DIAGNOSIS — Z79899 Other long term (current) drug therapy: Secondary | ICD-10-CM

## 2012-12-13 DIAGNOSIS — R000 Tachycardia, unspecified: Secondary | ICD-10-CM | POA: Diagnosis not present

## 2012-12-13 DIAGNOSIS — E282 Polycystic ovarian syndrome: Secondary | ICD-10-CM | POA: Diagnosis present

## 2012-12-13 DIAGNOSIS — K219 Gastro-esophageal reflux disease without esophagitis: Secondary | ICD-10-CM | POA: Diagnosis present

## 2012-12-13 DIAGNOSIS — Z8249 Family history of ischemic heart disease and other diseases of the circulatory system: Secondary | ICD-10-CM

## 2012-12-13 DIAGNOSIS — M5126 Other intervertebral disc displacement, lumbar region: Secondary | ICD-10-CM | POA: Diagnosis present

## 2012-12-13 DIAGNOSIS — K589 Irritable bowel syndrome without diarrhea: Secondary | ICD-10-CM | POA: Diagnosis present

## 2012-12-13 DIAGNOSIS — G8918 Other acute postprocedural pain: Secondary | ICD-10-CM | POA: Diagnosis not present

## 2012-12-13 HISTORY — PX: LAMINECTOMY, LUMBAR, TLIF: SHX4447

## 2012-12-13 HISTORY — PX: SPINAL FUSION: SHX223

## 2012-12-13 LAB — POCT PREGNANCY TEST, URINE HCG: POCT Pregnancy HCG Test, UR: NEGATIVE

## 2012-12-13 SURGERY — LAMINECTOMY, LUMBAR, TLIF
Anesthesia: Anesthesia General | Site: Spine Lumbar | Wound class: Clean

## 2012-12-13 MED ORDER — MEPERIDINE HCL 25 MG/ML IJ SOLN
25.0000 mg | INTRAMUSCULAR | Status: DC | PRN
Start: 2012-12-13 — End: 2012-12-13

## 2012-12-13 MED ORDER — CEFAZOLIN 1 GM MBP (CNR)
Status: AC
Start: 2012-12-13 — End: ?
  Filled 2012-12-13: qty 50

## 2012-12-13 MED ORDER — FENTANYL CITRATE 0.05 MG/ML IJ SOLN
INTRAMUSCULAR | Status: AC
Start: 2012-12-13 — End: ?
  Filled 2012-12-13: qty 2

## 2012-12-13 MED ORDER — ONDANSETRON HCL 4 MG/2ML IJ SOLN
4.0000 mg | Freq: Three times a day (TID) | INTRAMUSCULAR | Status: DC | PRN
Start: 2012-12-13 — End: 2012-12-16
  Filled 2012-12-13: qty 2

## 2012-12-13 MED ORDER — PROPOFOL INFUSION 10 MG/ML
INTRAVENOUS | Status: DC | PRN
Start: 2012-12-13 — End: 2012-12-13
  Administered 2012-12-13: 200 mg via INTRAVENOUS

## 2012-12-13 MED ORDER — GLYCOPYRROLATE 0.2 MG/ML IJ SOLN
INTRAMUSCULAR | Status: DC | PRN
Start: 2012-12-13 — End: 2012-12-13
  Administered 2012-12-13: .5 mg via INTRAVENOUS

## 2012-12-13 MED ORDER — OXYCODONE HCL ER 10 MG PO T12A
10.0000 mg | EXTENDED_RELEASE_TABLET | Freq: Two times a day (BID) | ORAL | Status: DC
Start: 2012-12-13 — End: 2012-12-16
  Administered 2012-12-13 – 2012-12-16 (×5): 10 mg via ORAL
  Filled 2012-12-13 (×6): qty 1

## 2012-12-13 MED ORDER — PANTOPRAZOLE SODIUM 40 MG PO TBEC
40.0000 mg | DELAYED_RELEASE_TABLET | Freq: Every morning | ORAL | Status: DC
Start: 2012-12-14 — End: 2012-12-16
  Administered 2012-12-14 – 2012-12-16 (×3): 40 mg via ORAL
  Filled 2012-12-13 (×3): qty 1

## 2012-12-13 MED ORDER — NEOSTIGMINE METHYLSULFATE 1 MG/ML IJ SOLN
INTRAMUSCULAR | Status: AC
Start: 2012-12-13 — End: ?
  Filled 2012-12-13: qty 10

## 2012-12-13 MED ORDER — ACETAMINOPHEN 325 MG PO TABS
650.0000 mg | ORAL_TABLET | ORAL | Status: DC | PRN
Start: 2012-12-13 — End: 2012-12-16
  Administered 2012-12-15: 650 mg via ORAL
  Filled 2012-12-13 (×2): qty 2

## 2012-12-13 MED ORDER — LIDOCAINE HCL (PF) 2 % IJ SOLN
INTRAMUSCULAR | Status: AC
Start: 2012-12-13 — End: ?
  Filled 2012-12-13: qty 5

## 2012-12-13 MED ORDER — GELATIN ABSORBABLE 100 EX MISC
CUTANEOUS | Status: DC | PRN
Start: 2012-12-13 — End: 2012-12-13
  Administered 2012-12-13: 1 via TOPICAL

## 2012-12-13 MED ORDER — PHENYLEPHRINE HCL 10 MG/ML IJ SOLN
INTRAMUSCULAR | Status: DC | PRN
Start: 2012-12-13 — End: 2012-12-13
  Administered 2012-12-13: 100 ug via INTRAVENOUS

## 2012-12-13 MED ORDER — SODIUM CHLORIDE 0.9 % IR SOLN
Status: DC | PRN
Start: 2012-12-13 — End: 2012-12-13

## 2012-12-13 MED ORDER — GABAPENTIN 300 MG PO CAPS
400.0000 mg | ORAL_CAPSULE | Freq: Three times a day (TID) | ORAL | Status: DC
Start: 2012-12-13 — End: 2012-12-16
  Administered 2012-12-14 – 2012-12-16 (×8): 400 mg via ORAL
  Filled 2012-12-13 (×17): qty 1

## 2012-12-13 MED ORDER — ROCURONIUM BROMIDE 50 MG/5ML IV SOLN
INTRAVENOUS | Status: AC
Start: 2012-12-13 — End: ?
  Filled 2012-12-13: qty 5

## 2012-12-13 MED ORDER — SODIUM CHLORIDE 0.9 % IV SOLN
INTRAVENOUS | Status: DC
Start: 2012-12-13 — End: 2012-12-16

## 2012-12-13 MED ORDER — CYCLOBENZAPRINE HCL 10 MG PO TABS
10.0000 mg | ORAL_TABLET | Freq: Three times a day (TID) | ORAL | Status: DC | PRN
Start: 2012-12-13 — End: 2012-12-16
  Administered 2012-12-13 – 2012-12-16 (×5): 10 mg via ORAL
  Filled 2012-12-13 (×5): qty 1

## 2012-12-13 MED ORDER — HYDROMORPHONE HCL PF 1 MG/ML IJ SOLN
INTRAMUSCULAR | Status: AC
Start: 2012-12-13 — End: 2012-12-13
  Administered 2012-12-13: 0.5 mg via INTRAVENOUS
  Filled 2012-12-13: qty 1

## 2012-12-13 MED ORDER — PROMETHAZINE HCL 25 MG/ML IJ SOLN
6.2500 mg | Freq: Once | INTRAMUSCULAR | Status: DC | PRN
Start: 2012-12-13 — End: 2012-12-13

## 2012-12-13 MED ORDER — CEPASTAT 14.5 MG MT LOZG
1.0000 | LOZENGE | OROMUCOSAL | Status: DC | PRN
Start: 2012-12-13 — End: 2012-12-16
  Administered 2012-12-14 – 2012-12-15 (×3): 1 via BUCCAL
  Filled 2012-12-13 (×2): qty 1
  Filled 2012-12-13: qty 2

## 2012-12-13 MED ORDER — PROPOFOL 10 MG/ML IV EMUL
INTRAVENOUS | Status: AC
Start: 2012-12-13 — End: ?
  Filled 2012-12-13: qty 20

## 2012-12-13 MED ORDER — HYDROMORPHONE HCL PF 1 MG/ML IJ SOLN
INTRAMUSCULAR | Status: AC
Start: 2012-12-13 — End: ?
  Filled 2012-12-13: qty 1

## 2012-12-13 MED ORDER — LACTATED RINGERS IV SOLN
INTRAVENOUS | Status: DC
Start: 2012-12-13 — End: 2012-12-13

## 2012-12-13 MED ORDER — THROMBIN 5000 UNITS EX SOLR
CUTANEOUS | Status: DC | PRN
Start: 2012-12-13 — End: 2012-12-13
  Administered 2012-12-13: 5000 [IU] via TOPICAL

## 2012-12-13 MED ORDER — EPHEDRINE SULFATE 50 MG/ML IJ SOLN
INTRAMUSCULAR | Status: AC
Start: 2012-12-13 — End: ?
  Filled 2012-12-13: qty 1

## 2012-12-13 MED ORDER — BACITRACIN 50000 UNITS IM SOLR
INTRAMUSCULAR | Status: DC | PRN
Start: 2012-12-13 — End: 2012-12-13

## 2012-12-13 MED ORDER — NEOSTIGMINE METHYLSULFATE 1 MG/ML IJ SOLN
INTRAMUSCULAR | Status: DC | PRN
Start: 2012-12-13 — End: 2012-12-13
  Administered 2012-12-13: 3 mg via INTRAVENOUS

## 2012-12-13 MED ORDER — MEPERIDINE HCL 25 MG/ML IJ SOLN
INTRAMUSCULAR | Status: AC
Start: 2012-12-13 — End: 2012-12-13
  Administered 2012-12-13: 25 mg via INTRAVENOUS
  Filled 2012-12-13: qty 1

## 2012-12-13 MED ORDER — NON FORMULARY
20.0000 mg | Freq: Every morning | Status: DC
Start: 2012-12-14 — End: 2012-12-13

## 2012-12-13 MED ORDER — FENTANYL 50 MCG/HR TD PT72
1.0000 | MEDICATED_PATCH | TRANSDERMAL | Status: DC
Start: 2012-12-13 — End: 2012-12-16
  Administered 2012-12-13: 50 ug via TRANSDERMAL
  Filled 2012-12-13: qty 1

## 2012-12-13 MED ORDER — PHENYLEPHRINE 100 MCG/ML IV BOLUS (ANESTHESIA)
PREFILLED_SYRINGE | INTRAVENOUS | Status: AC
Start: 2012-12-13 — End: ?
  Filled 2012-12-13: qty 5

## 2012-12-13 MED ORDER — DIPHENHYDRAMINE HCL 25 MG PO CAPS
25.0000 mg | ORAL_CAPSULE | Freq: Once | ORAL | Status: AC
Start: 2012-12-13 — End: 2012-12-13

## 2012-12-13 MED ORDER — PHENOL 1.4 % MT LIQD
1.0000 | OROMUCOSAL | Status: DC | PRN
Start: 2012-12-13 — End: 2012-12-16
  Administered 2012-12-15 (×2): 1 via ORAL
  Filled 2012-12-13: qty 177

## 2012-12-13 MED ORDER — OXYCODONE-ACETAMINOPHEN 5-325 MG PO TABS
2.0000 | ORAL_TABLET | ORAL | Status: DC | PRN
Start: 2012-12-13 — End: 2012-12-16
  Administered 2012-12-13 – 2012-12-16 (×12): 2 via ORAL
  Administered 2012-12-16 (×2): 1 via ORAL
  Filled 2012-12-13 (×15): qty 2

## 2012-12-13 MED ORDER — ROCURONIUM BROMIDE 50 MG/5ML IV SOLN
INTRAVENOUS | Status: DC | PRN
Start: 2012-12-13 — End: 2012-12-13
  Administered 2012-12-13: 40 mg via INTRAVENOUS

## 2012-12-13 MED ORDER — CEFAZOLIN SODIUM-DEXTROSE 2-3 GM-% IV SOLR
2.0000 g | Freq: Three times a day (TID) | INTRAVENOUS | Status: DC
Start: 2012-12-13 — End: 2012-12-13

## 2012-12-13 MED ORDER — DIPHENHYDRAMINE HCL 50 MG/ML IJ SOLN
6.2500 mg | Freq: Once | INTRAMUSCULAR | Status: AC
Start: 2012-12-13 — End: 2012-12-13

## 2012-12-13 MED ORDER — HYDROMORPHONE HCL PF 1 MG/ML IJ SOLN
INTRAMUSCULAR | Status: DC | PRN
Start: 2012-12-13 — End: 2012-12-13
  Administered 2012-12-13 (×2): 0.5 mg via INTRAVENOUS

## 2012-12-13 MED ORDER — ONDANSETRON HCL 4 MG/2ML IJ SOLN
INTRAMUSCULAR | Status: DC | PRN
Start: 2012-12-13 — End: 2012-12-13
  Administered 2012-12-13: 4 mg via INTRAVENOUS

## 2012-12-13 MED ORDER — SODIUM CHLORIDE 0.9 % IV MBP
1.0000 g | Freq: Once | INTRAVENOUS | Status: AC
Start: 2012-12-13 — End: 2012-12-13
  Administered 2012-12-13: 1 g via INTRAVENOUS

## 2012-12-13 MED ORDER — GLYCOPYRROLATE 0.2 MG/ML IJ SOLN
INTRAMUSCULAR | Status: AC
Start: 2012-12-13 — End: ?
  Filled 2012-12-13: qty 3

## 2012-12-13 MED ORDER — FENTANYL 75 MCG/HR TD PT72
1.0000 | MEDICATED_PATCH | TRANSDERMAL | Status: DC
Start: 2012-12-13 — End: 2012-12-13

## 2012-12-13 MED ORDER — MIDAZOLAM HCL 2 MG/2ML IJ SOLN
INTRAMUSCULAR | Status: DC | PRN
Start: 2012-12-13 — End: 2012-12-13
  Administered 2012-12-13: 2 mg via INTRAVENOUS

## 2012-12-13 MED ORDER — LIDOCAINE-EPINEPHRINE 1 %-1:100000 IJ SOLN
INTRAMUSCULAR | Status: DC | PRN
Start: 2012-12-13 — End: 2012-12-13
  Administered 2012-12-13: 10 mL

## 2012-12-13 MED ORDER — FAMOTIDINE 10 MG/ML IV SOLN (WRAP)
INTRAVENOUS | Status: DC | PRN
Start: 2012-12-13 — End: 2012-12-13
  Administered 2012-12-13: 20 mg via INTRAVENOUS

## 2012-12-13 MED ORDER — ONDANSETRON HCL 4 MG/2ML IJ SOLN
INTRAMUSCULAR | Status: AC
Start: 2012-12-13 — End: ?
  Filled 2012-12-13: qty 2

## 2012-12-13 MED ORDER — FENTANYL CITRATE 0.05 MG/ML IJ SOLN
INTRAMUSCULAR | Status: DC | PRN
Start: 2012-12-13 — End: 2012-12-13
  Administered 2012-12-13: 100 ug via INTRAVENOUS
  Administered 2012-12-13: 50 ug via INTRAVENOUS
  Administered 2012-12-13: 100 ug via INTRAVENOUS
  Administered 2012-12-13 (×3): 50 ug via INTRAVENOUS

## 2012-12-13 MED ORDER — ONDANSETRON HCL 4 MG/2ML IJ SOLN
INTRAMUSCULAR | Status: AC
Start: 2012-12-13 — End: 2012-12-13
  Administered 2012-12-13: 4 mg via INTRAVENOUS
  Filled 2012-12-13: qty 2

## 2012-12-13 MED ORDER — MIDAZOLAM HCL 2 MG/2ML IJ SOLN
INTRAMUSCULAR | Status: AC
Start: 2012-12-13 — End: ?
  Filled 2012-12-13: qty 2

## 2012-12-13 MED ORDER — FLUTICASONE PROPIONATE 50 MCG/ACT NA SUSP
2.0000 | Freq: Two times a day (BID) | NASAL | Status: DC
Start: 2012-12-13 — End: 2012-12-16
  Administered 2012-12-13 – 2012-12-16 (×6): 2 via NASAL
  Filled 2012-12-13: qty 16

## 2012-12-13 MED ORDER — BUPIVACAINE-EPINEPHRINE (PF) 0.5% -1:200000 IJ SOLN
INTRAMUSCULAR | Status: DC | PRN
Start: 2012-12-13 — End: 2012-12-13
  Administered 2012-12-13 (×2): 10 mL

## 2012-12-13 MED ORDER — NORGESTREL-ETHINYL ESTRADIOL 0.3-30 MG-MCG PO TABS
1.0000 | ORAL_TABLET | Freq: Every day | ORAL | Status: DC
Start: 2012-12-13 — End: 2012-12-16
  Administered 2012-12-15: 1 via ORAL
  Filled 2012-12-13 (×4): qty 1

## 2012-12-13 MED ORDER — SENNOSIDES-DOCUSATE SODIUM 8.6-50 MG PO TABS
1.0000 | ORAL_TABLET | Freq: Two times a day (BID) | ORAL | Status: DC
Start: 2012-12-13 — End: 2012-12-16
  Administered 2012-12-13 – 2012-12-16 (×7): 1 via ORAL
  Filled 2012-12-13 (×6): qty 1

## 2012-12-13 MED ORDER — DEXAMETHASONE SODIUM PHOSPHATE 4 MG/ML IJ SOLN (WRAP)
INTRAMUSCULAR | Status: DC | PRN
Start: 2012-12-13 — End: 2012-12-13
  Administered 2012-12-13: 6 mg via INTRAVENOUS

## 2012-12-13 MED ORDER — GABAPENTIN 300 MG PO CAPS
400.0000 mg | ORAL_CAPSULE | Freq: Two times a day (BID) | ORAL | Status: DC
Start: 2012-12-13 — End: 2012-12-13

## 2012-12-13 MED ORDER — LIDOCAINE HCL 2 % IJ SOLN
INTRAMUSCULAR | Status: DC | PRN
Start: 2012-12-13 — End: 2012-12-13
  Administered 2012-12-13: 50 mg

## 2012-12-13 MED ORDER — DIPHENHYDRAMINE HCL 50 MG/ML IJ SOLN
INTRAMUSCULAR | Status: AC
Start: 2012-12-13 — End: 2012-12-13
  Administered 2012-12-13: 6.5 mg via INTRAVENOUS
  Filled 2012-12-13: qty 1

## 2012-12-13 MED ORDER — GLYCOPYRROLATE 0.2 MG/ML IJ SOLN
INTRAMUSCULAR | Status: AC
Start: 2012-12-13 — End: ?
  Filled 2012-12-13: qty 1

## 2012-12-13 MED ORDER — PROMETHAZINE HCL 25 MG/ML IJ SOLN
INTRAMUSCULAR | Status: AC
Start: 2012-12-13 — End: ?
  Filled 2012-12-13: qty 1

## 2012-12-13 MED ORDER — ONDANSETRON HCL 4 MG/2ML IJ SOLN
4.0000 mg | Freq: Once | INTRAMUSCULAR | Status: AC | PRN
Start: 2012-12-13 — End: 2012-12-13

## 2012-12-13 MED ORDER — LACTATED RINGERS IV SOLN
INTRAVENOUS | Status: DC | PRN
Start: 2012-12-13 — End: 2012-12-14

## 2012-12-13 MED ORDER — HYDROMORPHONE HCL PF 1 MG/ML IJ SOLN
0.5000 mg | INTRAMUSCULAR | Status: DC | PRN
Start: 2012-12-13 — End: 2012-12-13
  Administered 2012-12-13 (×2): 0.5 mg via INTRAVENOUS

## 2012-12-13 MED ORDER — CETIRIZINE HCL 10 MG PO TABS
5.0000 mg | ORAL_TABLET | Freq: Every evening | ORAL | Status: DC
Start: 2012-12-13 — End: 2012-12-16
  Administered 2012-12-13 – 2012-12-15 (×3): 5 mg via ORAL
  Filled 2012-12-13 (×3): qty 1

## 2012-12-13 MED ORDER — SODIUM CHLORIDE 0.9 % IR SOLN
Status: DC | PRN
Start: 2012-12-13 — End: 2012-12-13
  Administered 2012-12-13: 1000 mL

## 2012-12-13 MED ORDER — LIDOCAINE-EPINEPHRINE 1 %-1:100000 IJ SOLN
INTRAMUSCULAR | Status: AC
Start: 2012-12-13 — End: ?
  Filled 2012-12-13: qty 40

## 2012-12-13 MED ORDER — CEFAZOLIN SODIUM 1 G IJ SOLR
1.0000 g | Freq: Three times a day (TID) | INTRAMUSCULAR | Status: AC
Start: 2012-12-13 — End: 2012-12-14
  Administered 2012-12-13 – 2012-12-14 (×2): 1 g via INTRAVENOUS
  Filled 2012-12-13: qty 1000
  Filled 2012-12-13: qty 50
  Filled 2012-12-13: qty 1000

## 2012-12-13 SURGICAL SUPPLY — 91 items
ALLOGRAFT OSTEOCEL PRO SM (Allograft) ×2 IMPLANT
ANGIO CATH CODM (Procedure Accessories) ×2 IMPLANT
BANDAGE ADHESIVE L2 YD X W6 IN STRETCH (Bandage)
BANDAGE ADHESIVE L2 YD X W6 IN STRETCH NONWOVEN POROUS COVER-ROLL (Bandage) IMPLANT
BLADE S/SU RIBBACK CARB STL 11 (Blade) IMPLANT
BNDG CVRL ADH 2YDX6IN PLSTR POLYACRYLATE (Bandage)
CATH BARDEX FOLEY 2WAY 16FR (Catheter Urine) ×2 IMPLANT
CONTAINER SPEC 8OZ NS SNPON LID TRNLU (Suction) ×2 IMPLANT
COTTONOID 1/2X1" 80-1402" (Dressing) IMPLANT
COVER HVYDTY BLK 65X90IN (Drape) IMPLANT
COVER MAYO CNVRT STND 23IN PLS REINF (Drape)
COVER STAND W23 IN REINFORCE PLASTIC (Drape) IMPLANT
COVER STND PLS MAYO CNVRT 23IN LF STRL (Drape)
CTTND 1/2X1/2" (Sponge) IMPLANT
DRAIN SUCTION ROUND 7FR (Drain) IMPLANT
DRAPE 3/4 SHEET FANFLD 52X76IN (Drape) ×4 IMPLANT
DRAPE INST MAG 121 (Drape) ×2 IMPLANT
DRAPE LAPAROTOMY (Drape) ×2 IMPLANT
DRAPE LIECA MICROSCOPE 54X150 (Drape) IMPLANT
DRAPE SRG PE STRDRP 17X11IN LF STRL ADH (Drape) ×2
DRAPE SURGICAL ADHESIVE STRIP SMALL TOWEL MATTE FINISH L17 IN X W11 IN (Drape) ×1 IMPLANT
ELECTRODE BLADE (Cautery) ×2 IMPLANT
ELECTRODE ELECTROSURGICAL BLADE L4 IN (Cautery) ×1
ELECTRODE ELECTROSURGICAL BLADE L4 IN OD3/32 IN EDGE L.2 IN INSULATE (Cautery) ×1 IMPLANT
ELECTRODE ESURG BLDE EDG 3/32IN 4IN LF (Cautery) ×1
EVACUATOR WOUND SILICONE 100CC (Drain) IMPLANT
FORCEP BOVIE TWEEZER DISP (Instrument) ×2 IMPLANT
GLOVE SURG BIOGEL LF SZ8 (Glove) ×4 IMPLANT
GOWN SRG 2XL SMARTGOWN LF STRL LVL 4 (Gown) ×1
GOWN SURGICAL 2XL SMARTGOWN LEVEL 4 (Gown) ×1
GOWN SURGICAL 2XL SMARTGOWN LEVEL 4 BREATHABLE (Gown) ×1 IMPLANT
GRAFT BONE FILLER FROZEN SM OSTEOCEL 1CC PUTTY 5016001 (Allograft) ×1 IMPLANT
HEMOSTAT INSTAT MCH COLLAGEN (Hemostat) ×2 IMPLANT
MASTISOL VIAL 2/3CC STRL (Skin Closure) ×2 IMPLANT
MATRIX PUROS DBM 20X50X3MM (Bone) ×2 IMPLANT
NEEDLE 25GA 1 1/2 (Needles) ×2 IMPLANT
NEEDLE REG BEVEL 19GX1.5IN (Needles) ×8 IMPLANT
NEEDLE SPINAL DISP 18GX3.5IN (Needles) IMPLANT
PACK SPNE FFX (Pack) ×2 IMPLANT
PAD ELECTROSRG GRND REM W CRD (Procedure Accessories) ×2 IMPLANT
POSITIONER HEAD SLOTTED ADLT (Positioning Supplies) ×2 IMPLANT
ROD SPINAL 40MM 5.5MM APEX SPINE SYSTEM STRAIGHT TITANIUM (Rods) ×2 IMPLANT
ROD SPNL TI STRG APX SPNE SYS 5.5MM 40MM (Rods) ×4 IMPLANT
SCREW BN TI APX SPNE SYS 5.5MM 35MM NS (Screw) ×4 IMPLANT
SCREW BN TI APX SPNE SYS 5.5MM 40MM NS (Screw) ×4 IMPLANT
SCREW BN TI APX SPNE SYS 5.5MM 45MM NS (Screw) ×2 IMPLANT
SCREW BONE 5.5MM 35MM APEX SPINE SYSTEM TITANIUM PEDICLE POLYAXIAL (Screw) ×2 IMPLANT
SCREW BONE 5.5MM 40MM APEX SPINE SYSTEM TITANIUM PEDICLE POLYAXIAL (Screw) ×2 IMPLANT
SCREW BONE 5.5MM 45MM APEX SPINE SYSTEM TITANIUM PEDICLE POLYAXIAL (Screw) ×1 IMPLANT
SCREW SET 9.5MM T30 SPINE APEX NONSTERILE POLY (Screw) ×4 IMPLANT
SCREW SET T30 APX 9.5MM NS SPNE POLY (Screw) ×8 IMPLANT
SLEEVE SEQUEN COMP KNEE REG (Procedure Accessories) IMPLANT
SOL NACL INJ 0.9% 30ML BACTER (IV Solutions) ×2 IMPLANT
SOLUTION IRR 0.9% NACL 1000ML LF STRL (Irrigation Solutions) ×1
SOLUTION IRRIGATION 0.9% SODIUM CHLORIDE (Irrigation Solutions) ×1
SOLUTION IRRIGATION 0.9% SODIUM CHLORIDE 1000 ML PLASTIC POUR BOTTLE (Irrigation Solutions) ×1 IMPLANT
SOLUTION SRGPRP 74% ISPRP 0.7% IOD (Prep) ×2
SOLUTION SURGICAL PREP 26 ML DURAPREP (Prep) ×2
SOLUTION SURGICAL PREP 26 ML DURAPREP 74% ISOPROPYL ALCOHOL 0.7% (Prep) ×2 IMPLANT
SPCR SIGN TLIF SM 9MM (Spacer) ×2 IMPLANT
SPNG ABSORBABLE GELATIN (Hemostat) ×2 IMPLANT
SPONGE GAUZE L4 IN X W4 IN 16 PLY (Dressing) ×1
SPONGE GAUZE L4 IN X W4 IN 16 PLY MAXIMUM ABSORBENT USP TYPE VII (Dressing) ×1 IMPLANT
SPONGE GZE CTTN CRTY 4X4IN LF NS 16 PLY (Dressing) ×1
STAPLER SKIN PROXIMATE REG (Skin Closure) IMPLANT
STRIP SKIN CLOSURE L4 IN X W1/2 IN (Dressing) ×1
STRIP SKIN CLOSURE L4 IN X W1/2 IN REINFORCE STERI-STRIP POLYESTER (Dressing) ×1 IMPLANT
STRIP SKNCLS PLSTR STRSTRP 4X.5IN LF (Dressing) ×1
SUTURE ABS 0 CT1 VCL 18IN CR BRD 8 STRN (Suture)
SUTURE ABS 0 OS-6 VCL 18IN CR BRD 3 STRN (Suture) ×2
SUTURE ABS 4-0 PS2 MNCRL MTPS 18IN MFL (Suture) ×1
SUTURE COATED VICRYL 0 CT-1 L18 IN (Suture)
SUTURE COATED VICRYL 0 CT-1 L18 IN CONTROL BRD 8 STRAND VLT ABSRBBL (Suture) IMPLANT
SUTURE COATED VICRYL 0 OS-6 L18 IN (Suture) ×2
SUTURE COATED VICRYL 0 OS-6 L18 IN CONTROL RELEASE BRAID 3 STRAND (Suture) ×2 IMPLANT
SUTURE MONOCRYL 4-0 PS-2 L18 IN (Suture) ×1
SUTURE MONOCRYL 4-0 PS-2 L18 IN MONOFILAMENT UNDYED ABSORBABLE (Suture) ×1 IMPLANT
SUTURE VICRYL 3-0 CP2 8X18IN (Suture) ×2 IMPLANT
SYRINGE 20 ML BD LUER-LOK MEDICAL (Syringes, Needles) ×1 IMPLANT
SYRINGE LUER LOCK 10CC (Syringes, Needles) ×8 IMPLANT
SYRINGE MED 20ML LL LF STRL (Syringes, Needles) ×2
TOOL DISSECTING L10 CM MATCH HEAD OD1.7 (Burr)
TOOL DISSECTING L10 CM MATCH HEAD OD1.7 MM LEGEND BURR (Burr) IMPLANT
TOOL DISSECTING L14 CM MATCH HEAD FLUTE (Burr)
TOOL DISSECTING L14 CM MATCH HEAD FLUTE OD3 MM MIDAS REX LEGEND (Burr) IMPLANT
TOOL DISSECTING L9 CM MATCH HEAD FLUTE (Burr)
TOOL DISSECTING L9 CM MATCH HEAD FLUTE OD3 MM MIDAS REX LEGEND (Burr) IMPLANT
TOOL DSCT LGND 1.7MM 10CM STRL MTCH HD (Burr)
TOOL DSCT LGND 3MM 14MM (Burr)
TOOL DSCT MTCH HD LGND 3MM 9CM (Burr)
TOWEL STERILE REUSABLE 8PK (Procedure Accessories) ×2 IMPLANT

## 2012-12-13 NOTE — Discharge Instructions (Signed)
[facility] Spine Surgery booklet to be provided and reviewed -    Spine Wound Care information sheet to be provided and reviewed -    Constipation Information sheet to be provided and reviewed   -

## 2012-12-13 NOTE — Progress Notes (Signed)
NEUROSURGERY PROGRESS NOTE    Date Time: 12/13/2012 4:30 PM  Patient Name: [name] [name]  Consulting Attending Physician: Dr. Rosemarie Beath  Covered By: Neurosurgery PA/pager [phone]    Interim History:   POD#0 s/p L5/S1 TLIF. Patient is currently complaining of back pain and LLE "tightness". At this time she feels LLE radicular pain is somewhat improved. Due for pain medication.    Medications:     Current Facility-Administered Medications   Medication Dose Route Frequency   . [COMPLETED] ceFAZolin  1 g Intravenous Once   . ceFAZolin  1 g Intravenous Q8H   . cetirizine  5 mg Oral QHS   . [COMPLETED] diphenhydrAMINE  25 mg Oral Once    Or   . [COMPLETED] diphenhydrAMINE  6.5 mg Intravenous Once   . fluticasone  2 spray Each Nare Q12H SCH   . gabapentin  400 mg Oral Q8H   . norgestrel-ethinyl estradiol  1 tablet Oral Daily   . oxyCODONE  10 mg Oral Q12H SCH   . senna-docusate  1 tablet Oral BID   . [DISCONTINUED] ceFAZolin  2 g Intravenous Q8H   . [DISCONTINUED] gabapentin  400 mg Oral Q12H   . [DISCONTINUED] NON-FORMULARY order form  20 mg Oral QAM         Physical Exam:     Filed Vitals:    12/13/12 1553   BP: 131/91   Pulse: 75   Temp: 96.4 F (35.8 C)   Resp: 16   SpO2: 100%       Intake and Output Summary (Last 24 hours) at Date Time    Intake/Output Summary (Last 24 hours) at 12/13/12 1630  Last data filed at 12/13/12 1514   Gross per 24 hour   Intake   2600 ml   Output   1085 ml   Net   1515 ml       Suture/Staple Removal: POD 10-14    Neuro exam:  AAOx3, moderate distress 2/2 pain  Speech intact  CN II_XII grossly intact  BUE 5/5  BLE 5/5 except LLE DF/PF 4/5, likely pain/effort limiting at this time  SILT    Incision: dressing c/d/i    Labs:     Lab Results   Component Value Date    WBC 8.55 11/27/2012    HGB 12.4 11/27/2012    HCT 38.2 11/27/2012    MCV 78.4* 11/27/2012    PLT 288 11/27/2012     Lab Results   Component Value Date    NA 140 11/27/2012    K 4.5 11/27/2012    CL 104 11/27/2012    CO2  24 11/27/2012     Lab Results   Component Value Date    INR 1.0 11/27/2012    PT 13.5 11/27/2012       Rads:     Radiology Results (24 Hour)     Procedure Component Value Units Date/Time    Fluoroscopy greater than 1 hour [[id number]] Collected:12/13/12 1406    Order Status:Completed  Updated:12/13/12 1411    Narrative:    History: L5-S1 laminectomy    Fluoroscopic guidance was provided for this operative procedure,  performed without the presence of a radiologist. Fluoroscopy time 49.9  seconds.      Impression:    Impression: Fluoroscopy provided.         Will Bonnet, MD   12/13/2012 2:06 PM          Assessment:   27 yo female POD#0 s/p  L5/S1 TLIF    Plan:   Ancef Q8H x 2 doses post-operatively  Activity: OOB with brace, LSO delivered today  Diet: regular, advance as tolerated  Pain control, muscle relaxer, Neurontin  No need for post-op imaging  Reinforce dressing prn  SVDs for DVT ppx  D/C foley in am  No pre-operative Beta Blocker      Signed by: Karma Lew PA-C  Date/Time: 12/13/2012 4:30 PM

## 2012-12-13 NOTE — H&P (Signed)
Mrs. Roepke presents for followup. She is a 27 year old woman with   debilitating lower back pain that radiates to the bilateral lower   extremities. Back pain constitutes 80% of her pain syndrome, leg pain 20%.   Her leg pain is predominantly to the left leg. Since her last visit, she   has begun to experience numbness in the left big toe, in addition to the plantar surface of her foot.   Pain is 6/10. She has a prior surgical history of L5-S1 microdiskectomy in 2009 for left leg   pain and low back pain since 2006. She presents today with a new MRI. On   review of the patient's MRI, she has a disk herniation centrally with   degenerative disk disease at L5-S1. There is some pressure on the exiting   nerve root and the traversing nerve root on the left. Evidence of the   prior microdiskectomy is visible.   PHYSICAL EXAMINATION:   VITAL SIGNS: Blood pressure is 116/82, pulse is 97.   LUNGS: Clear to auscultation, positive bilateral breath sounds.   HEART: Regular rhythm, no murmurs, gallops or rubs.   ABDOMEN: Soft, nontender, positive bowel sounds.   HEENT: Cranial nerves II through XII intact. Pupils equal, reactive to   light, 3 to 2. No papilledema. Cranial nerve V intact to light touch.   Face symmetric. Tongue midline. Speech fluent and coherent. Hearing   intact to finger rub.   NECK: Soft, supple, full range of motion.   EXTREMITIES: 5/5 strength deltoid, biceps, triceps, intrinsics.   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 In the lower extremities, psoas   and quadriceps were 5/5 on the right and left. On the patient's right   side, plantar flexion, dorsiflexion 5/5 on her left side. Dorsiflexion was   5/5, but plantar flexion was only 4/5. Sensation is diminished in the   bottom of the foot and lateral foot to pinprick and joint perception.   Otherwise, intact to light touch, pinprick, and joint perception   throughout. Deep tendon reflexes +2 for the knee jerk. Ankle jerk was 2   on the right, 1 on the  left. Deep tendon reflexes in the upper extremities   were +2 for the biceps, brachioradialis, and triceps.   General Appearance:  Alert, cooperative, no distress, appears stated age    Head:  Normocephalic, atraumatic    Eyes:  PERRL, conjunctiva/corneas clear, sclera anicteric    Nose:  Nares normal, septum midline, mucosa normal, no drainage   or sinus tenderness    Throat:  Lips, mucosa, and tongue normal; Oropharynx clear, no exudate    Neck:  Supple, symmetrical, trachea midline, no adenopathy;   thyroid: No enlargement/tenderness/nodules;  Carotids 2+ Bilaterally, no JVD    Lungs:  Clear to auscultation bilaterally, respirations unlabored    Chest wall:  No tenderness or deformity    Heart:  Regular rate and rhythm, S1 and S2 normal, no murmur, no rub or gallop    Abdomen:  Soft, non-tender, bowel sounds active all four quadrants,   no masses, no organomegaly    Extremities:  Extremities warm, atraumatic, no clubbing, cyanosis or edema    Pulses:  2+ and symmetric all extremities, including radial and DP    Skin:  Skin color, texture, turgor normal, no rashes or lesions    IMPRESSION:   The patient has degenerative disk disease, L5-S1 with axial pain and left S1   radicular pain secondary to degenerative disk disease and disk herniation.  PLAN:   Given the fact that the patient has back and leg pain, I would recommend a   transforaminal lumbar interbody fusion left-sided approach with   posterolateral fusion. Risks, benefits were discussed in detail with the   patient and her husband, who is a physician, and they have agreed to   proceed. Models were brought out to describe the procedure as well in   order to facilitate their understanding of the procedure. Consents were signed and pre-op labs were ordered   [facility]

## 2012-12-13 NOTE — Progress Notes (Signed)
Ht: 5'5"  Wt: 172# Adm Date:  12/13/12    Affected Side:  []  Right     []  Left     []  Bilateral    [x] Spine   [] Cranium  ______________    Reasons for Visit:  [x] Evaluation   [] Cast   [x] Meas.  [x] Fitting   [x] Delivery         [] F/U   [] Adjust/Repair    The patient expressed: [x] None   [] Or:     We observed the patient: []  Doing Well  [x] Or: disk herniation centrally with degenerative disk disease at L5-S1 L5-S1 TILF    Dx Code:722.52    Today we: [x] Initial Eval   [] Casted   [x] Measured   [x] Fit    [x] Delivered                [] Eval. Fit and Func.   [] Modified/ Adjusted    [] F/U    [] Other:  The pt was fit with an OTS LSO as per Md's orders, please reference note by Dr. Rosemarie Beath 12/13/12 @8 :27am. The order was clarified with Melanie Crazier to treat the pt with an LSO not TLSO. The brace is medically necessary to support and stabilize the spine as the pt heals from surgery. She was fit with an XL due to her waist measurement of 40". The pt and husband were given written and verbal instructions on how to don/doff the brace. She was instructed to follow Md's orders and wearing schedule. Call [employer] with any issues.     We have issued:  OTS LSO       [x] Per Prescription/ Written order   [x] Verbal Order     The patient's device is: [x] Comfortable and Functional   [x] Fit Appropriately   [] Or:     Ordering Physician: Dr. Rosemarie Beath         [x] I certify that the proper codes have been input into the patient transaction.    We advised the patient:  [x] Follow MD orders  [x] Call if any problems  [] Will order           [] Will start fabrication     [] Schedule Appt.    Matters for Next visit:  [] Cast   [] Meas.  [] Fitting   [] Delivery   [x] F/U   [] Other:    Next Appointment:PRN    The patient/representative accepted and agreed with:   [employer]  1)The product ordered and delivered as prescribed.    [address]   2)The plan for treatment as prescribed     [place], [place] [address]  3)The  instructions on the use and care of the device   [phone]  4)Advice to contact this office should any problems or questions arise.  Fax: [phone]    Seen by:  Doristine Section,  Certified Orthotic Fitter

## 2012-12-13 NOTE — Transfer of Care (Signed)
Anesthesia Transfer of Care Note    Patient: [name] [name]    Procedures performed: Procedure(s) with comments:  LAMINECTOMY, LUMBAR, TLIF - L5-S1 TLIF    Anesthesia type: General ETT    Patient location:PACU    Last vitals:   Filed Vitals:    12/13/12 0751   BP: 145/75   Pulse: 99   Temp: 99.2 F (37.3 C)   SpO2: 100%       Post pain: Patient not complaining of pain, continue current therapy      Mental Status:awake    Respiratory Function: tolerating nasal cannula    Cardiovascular: stable    Nausea/Vomiting: patient not complaining of nausea or vomiting    Hydration Status: adequate    Post assessment: no reportable events

## 2012-12-13 NOTE — Anesthesia Postprocedure Evaluation (Signed)
The patient is awake or easily arousable.    The patient's respirations and cardiovascular status have been evaluated and are stable.    Post operative nausea, vomiting and pain have been treated as effectively as possible without compromising the patient's respiratory and cardiovascular status.    Refer to Post Op PACU documentation for confirmation of attainment of normothermia and adequate hydration.    There were no obvious anesthetic related complications.    The patient has recovered adequately to be transferred at this time.

## 2012-12-13 NOTE — Anesthesia Preprocedure Evaluation (Signed)
Anesthesia Evaluation    AIRWAY    Mallampati: II    TM distance: >3 FB  Neck ROM: full  Mouth Opening:full   CARDIOVASCULAR    cardiovascular exam normal       DENTAL           PULMONARY    pulmonary exam normal     OTHER FINDINGS                      Anesthesia Plan    ASA 2     general                     intravenous induction   Detailed anesthesia plan: general endotracheal        Post op pain management: per surgeon    informed consent obtained    Plan discussed with CRNA.      pertinent labs reviewed

## 2012-12-13 NOTE — Plan of Care (Signed)
Problem: Day of Surgery- Lumbar Discectomy/Laminectomy/Fusion  Goal: Free from Infection  Outcome: Progressing  No s/s of infection       Intervention: Monitor/assess vital signs  Vitals stable  Intervention: Maintain temperature within desired parameters.  Afebrile   Intervention: Assess surgical dressing, reinforce or change as needed per order.  Dressing to mi back CDI no drainage   Intervention: Foley maintenance care if in place  Foley intact - will [place] tomorrow am     Goal: Pain at adequate level as identified by patient  Outcome: Progressing  Came up from pacu in a lot of pain   Percocet given   Goal: Patient will maintain normal GI status  Outcome: Progressing  Denies nausea     Comments:   Drowsy but arousable ox3. Anxious.  MAE, slight weakness to LLE. States her left leg "feels stiff". Denies n/t.   Fitted for brace upon arrival to unit.  On 2l NC,denies sob   VSS  Denies nausea.  Foley intact will [place] in am   im back dressing CDI/ no drainage   Prn percocet given   Family members at bedside  Will continue to monitor

## 2012-12-13 NOTE — Pre-Procedure Instructions (Signed)
Rev/T&S done 12/4

## 2012-12-14 LAB — CBC
Hematocrit: 33.7 % — ABNORMAL LOW (ref 37.0–47.0)
Hgb: 10.7 g/dL — ABNORMAL LOW (ref 12.0–16.0)
MCH: 24.4 pg — ABNORMAL LOW (ref 28.0–32.0)
MCHC: 31.8 g/dL — ABNORMAL LOW (ref 32.0–36.0)
MCV: 76.9 fL — ABNORMAL LOW (ref 80.0–100.0)
MPV: 11.1 fL (ref 9.4–12.3)
Nucleated RBC: 0 (ref 0–1)
Platelets: 207 (ref 140–400)
RBC: 4.38 (ref 4.20–5.40)
RDW: 15 % (ref 12–15)
WBC: 12.84 — ABNORMAL HIGH (ref 3.50–10.80)

## 2012-12-14 LAB — BASIC METABOLIC PANEL
BUN: 6 mg/dL — ABNORMAL LOW (ref 7.0–19.0)
CO2: 19 mEq/L — ABNORMAL LOW (ref 22–29)
Calcium: 9.1 mg/dL (ref 8.5–10.5)
Chloride: 107 mEq/L (ref 98–107)
Creatinine: 0.7 mg/dL (ref 0.6–1.0)
Glucose: 104 mg/dL — ABNORMAL HIGH (ref 70–100)
Potassium: 3.8 mEq/L (ref 3.5–5.1)
Sodium: 139 mEq/L (ref 136–145)

## 2012-12-14 LAB — GFR: EGFR: >60

## 2012-12-14 MED ORDER — DIPHENHYDRAMINE HCL 25 MG PO CAPS
25.0000 mg | ORAL_CAPSULE | Freq: Four times a day (QID) | ORAL | Status: DC | PRN
Start: 2012-12-14 — End: 2012-12-16

## 2012-12-14 MED ORDER — DIPHENHYDRAMINE HCL 25 MG PO CAPS
25.0000 mg | ORAL_CAPSULE | Freq: Four times a day (QID) | ORAL | Status: DC | PRN
Start: 2012-12-14 — End: 2012-12-16
  Administered 2012-12-14: 25 mg via ORAL
  Filled 2012-12-14: qty 1

## 2012-12-14 MED ORDER — DIPHENHYDRAMINE HCL 50 MG/ML IJ SOLN
25.0000 mg | Freq: Four times a day (QID) | INTRAMUSCULAR | Status: DC | PRN
Start: 2012-12-14 — End: 2012-12-16

## 2012-12-14 NOTE — Progress Notes (Signed)
Assumed care of patient. Back dressing clean dry and intact. Up with assist and lso brace.

## 2012-12-14 NOTE — Plan of Care (Signed)
Problem: Day of Surgery- Lumbar Discectomy/Laminectomy/Fusion  Goal: Free from Infection  Outcome: Completed Date Met:  12/14/12  VSS stable; no signs of  Infection; surgical dressing dry and intact; pt. Educated concerning incentive spirometer and encouraged to use regularly while awake. Antibiotics given as prescribed.   Goal: Hemodynamic Stability  Outcome: Completed Date Met:  12/14/12  Patient's vital signs within normal limits. Temperature less than 100. Tolerating PO fluids   Goal: Mobility/activity is maintained at optimum level for patient  Outcome: Progressing  Spinal precautions maintained; pt. Educated concerning log rolling while in bed. Pt. Dangled at the bedside.   Goal: Pain at adequate level as identified by patient  Outcome: Progressing  Pt.'s pain controlled by prescribed medication; patient comfort function goal is met; ice applied at the back   Goal: Patient will maintain normal GI status  Outcome: Completed Date Met:  12/14/12  No complaints of nausea or vomiting; bowel sounds present; pt. Tolerating regular diet. Stool softener given as prescribed.   Goal: Stable Neurovascular Status  Outcome: Completed Date Met:  12/14/12  Pulses palpable; capillary refill less than 3 seconds; no complaints of numbness or tingling; no presence of edema; scd in place; will continue to monitor

## 2012-12-14 NOTE — Progress Notes (Signed)
NEUROSURGERY PROGRESS NOTE    Date Time: 12/14/2012 2:38 PM  Patient Name: [name] [name]  Attending Physician:Dr. Rosemarie Beath  Covered By: Neurosurgery PA/pager [phone]    Interim History:   C/o itching and mild nausea but was able to tolerate some breakfast today. Did not get much sleep last night.    Medications:     Current Facility-Administered Medications   Medication Dose Route Frequency   . [COMPLETED] ceFAZolin  1 g Intravenous Q8H   . cetirizine  5 mg Oral QHS   . fentaNYL  1 patch Transdermal Q72H   . fluticasone  2 spray Each Nare Q12H SCH   . gabapentin  400 mg Oral Q8H   . norgestrel-ethinyl estradiol  1 tablet Oral Daily   . oxyCODONE  10 mg Oral Q12H SCH   . pantoprazole  40 mg Oral QAM AC   . senna-docusate  1 tablet Oral BID   . [DISCONTINUED] ceFAZolin  2 g Intravenous Q8H   . [DISCONTINUED] fentaNYL  1 patch Transdermal Q72H   . [DISCONTINUED] gabapentin  400 mg Oral Q12H   . [DISCONTINUED] NON-FORMULARY order form  20 mg Oral QAM       Physical Exam:     Filed Vitals:    12/14/12 1241   BP: 101/53   Pulse: 73   Temp: 97.3 F (36.3 C)   Resp: 16   SpO2: 94%         Intake/Output Summary (Last 24 hours) at 12/14/12 1438  Last data filed at 12/14/12 0407   Gross per 24 hour   Intake      0 ml   Output   4460 ml   Net  -4460 ml     Neuro exam:  A&O x3.  Eyes open spontaneously.   Following commands, Speech fluent and appropriate  PEERL 4mm.   EOMI, no nystagmus  FS, no droop  TM  5/5 BUE  5/5 BLE except 4/5 Right plantar flex  2+ brachioradialis and knee jerk  SILT  GCS 15  Incision:not viewed    Labs:     Lab Results   Component Value Date    WBC 12.84* 12/14/2012    HGB 10.7* 12/14/2012    HCT 33.7* 12/14/2012    MCV 76.9* 12/14/2012    PLT 207 12/14/2012     Lab Results   Component Value Date    NA 139 12/14/2012    K 3.8 12/14/2012    CL 107 12/14/2012    CO2 19* 12/14/2012     Lab Results   Component Value Date    INR 1.0 11/27/2012    PT 13.5 11/27/2012     Lab Results   Component Value Date     BUN 6.0* 12/14/2012     Lab Results   Component Value Date    CREAT 0.7 12/14/2012       Assesment:   27 y/o F POD #1 12/5 L5-S1 TLIF. Neuro stable.    Plan:   Continue OOB w LSO brace  Planing for D/c to home tomorrow if stable    Signed by: Chad Cordial PA-C  Date/Time: 12/14/2012 2:38 PM

## 2012-12-14 NOTE — Plan of Care (Signed)
Problem: Day 1 Post-op- Lumbar Discectomy/Laminectomy/Fusion  Goal: Free from Infection  Outcome: Progressing  Pt. Vitals signs monitor q 4hr. encourat pt. To [place] IS 10 times with in a hr. No drains and no foley pt. Voiding w/o difficulty.  Goal: Hemodynamic Stability  Outcome: Completed Date Met:  12/14/12  Pt. Voiding to bathroom.   Goal: Mobility/activity is maintained at optimum level for patient  Outcome: Completed Date Met:  12/14/12  Pt. Able to log roll and get in and out of bed. W/o assit. Pt. Abel to ambulate to bathroom with help of front wheel walker and brace in place.   Goal: Pain at adequate level as identified by patient  Outcome: Completed Date Met:  12/14/12  At this time pain is controled with oral pain medication.   Goal: Patient will maintain normal GI status  Outcome: Completed Date Met:  12/14/12  No nausea or vomiting and tolarating regular diet. Bowel sounds are present. Soft abd.   Goal: Patient/Patient Care Companion demonstrates understanding of disease process, treatment plan, medications, and discharge plan  Outcome: Completed Date Met:  12/14/12  Pt has back action booklet, unsure if case management has to get invole.   Goal: Stable Neurovascular Status  Outcome: Progressing  Neurovascular status in tact, some weakness to RLE.

## 2012-12-14 NOTE — Progress Notes (Signed)
Patient ambulated to bathroom and up in bedside chair x 1/2 hour then ambulated from chair out to hallway to nurses station and back, tolerated well.

## 2012-12-15 ENCOUNTER — Inpatient Hospital Stay: Payer: Charity

## 2012-12-15 LAB — CBC AND DIFFERENTIAL
Basophils Absolute Automated: 0.02 (ref 0.00–0.20)
Basophils Automated: 0 %
Eosinophils Absolute Automated: 0.09 (ref 0.00–0.70)
Eosinophils Automated: 1 %
Hematocrit: 34.8 % — ABNORMAL LOW (ref 37.0–47.0)
Hgb: 11.2 g/dL — ABNORMAL LOW (ref 12.0–16.0)
Immature Granulocytes Absolute: 0.03
Immature Granulocytes: 0 %
Lymphocytes Absolute Automated: 3.04 (ref 0.50–4.40)
Lymphocytes Automated: 29 %
MCH: 25.2 pg — ABNORMAL LOW (ref 28.0–32.0)
MCHC: 32.2 g/dL (ref 32.0–36.0)
MCV: 78.2 fL — ABNORMAL LOW (ref 80.0–100.0)
MPV: 11.1 fL (ref 9.4–12.3)
Monocytes Absolute Automated: 0.71 (ref 0.00–1.20)
Monocytes: 7 %
Neutrophils Absolute: 6.57 (ref 1.80–8.10)
Neutrophils: 63 %
Nucleated RBC: 0 (ref 0–1)
Platelets: 199 (ref 140–400)
RBC: 4.45 (ref 4.20–5.40)
RDW: 15 % (ref 12–15)
WBC: 10.46 (ref 3.50–10.80)

## 2012-12-15 LAB — URINALYSIS WITH MICROSCOPIC
Bilirubin, UA: NEGATIVE
Blood, UA: NEGATIVE
Glucose, UA: NEGATIVE
Nitrite, UA: NEGATIVE
Protein, UR: NEGATIVE
Specific Gravity UA: 1.009 (ref 1.001–1.035)
Urine pH: 6.5 (ref 5.0–8.0)
Urobilinogen, UA: NORMAL mg/dL

## 2012-12-15 LAB — IHS D-DIMER: D-Dimer: 0.74 — ABNORMAL HIGH (ref 0.00–0.51)

## 2012-12-15 MED ORDER — BISACODYL 10 MG RE SUPP
10.0000 mg | Freq: Once | RECTAL | Status: AC
Start: 2012-12-15 — End: 2012-12-15
  Administered 2012-12-15: 10 mg via RECTAL
  Filled 2012-12-15: qty 1

## 2012-12-15 MED ORDER — GLYCERIN 2 G RE SUPP (ADULT)
1.0000 | Freq: Every day | RECTAL | Status: DC | PRN
Start: 2012-12-15 — End: 2012-12-16
  Filled 2012-12-15: qty 1

## 2012-12-15 NOTE — Progress Notes (Signed)
Addendum:    Seen and examined.     Send urine for culture, if she develops fever we will start her with antibiotics. Dulcolax supp once

## 2012-12-15 NOTE — Progress Notes (Signed)
This am temp elevated at 102 post receiving 2 percocet's. HR 124 15 111/50. Pt. Does not c/o chest pain, notified Neuro PA , pt. Up oob to bathroom with assist. Will continue to monitor

## 2012-12-15 NOTE — Progress Notes (Signed)
Pt oob with staff ambulate the halls with brace, encourage IS pt. Able to achieve 1000, pt. Up to bathroom, labs drawn, pt. Medicated with oral pain medication and muscle relaxer. Temp. Decrease to 100.0. Pt. On sinus tach

## 2012-12-15 NOTE — Progress Notes (Signed)
NEUROSURGERY PROGRESS NOTE    Date Time: 12/15/2012 4:44 PM  Patient Name: [name] [name]  Attending Physician:Dr. Rosemarie Beath  Covered By: Neurosurgery PA/pager [phone]    Interim History:   Stating pain meds made her sleepy. Stating nausea meds made her dizzy.    Medications:     Current Facility-Administered Medications   Medication Dose Route Frequency   . [COMPLETED] bisacodyl  10 mg Rectal Once   . cetirizine  5 mg Oral QHS   . fentaNYL  1 patch Transdermal Q72H   . fluticasone  2 spray Each Nare Q12H SCH   . gabapentin  400 mg Oral Q8H   . norgestrel-ethinyl estradiol  1 tablet Oral Daily   . oxyCODONE  10 mg Oral Q12H SCH   . pantoprazole  40 mg Oral QAM AC   . senna-docusate  1 tablet Oral BID       Physical Exam:     Filed Vitals:    12/15/12 1216   BP: 112/72   Pulse: 120   Temp: 99.1 F (37.3 C)   Resp: 20   SpO2: 97%         Intake/Output Summary (Last 24 hours) at 12/15/12 1644  Last data filed at 12/15/12 0600   Gross per 24 hour   Intake    600 ml   Output   1225 ml   Net   -625 ml     Neuro exam:  A&O x3.  Eyes open spontaneously.   Following commands, Speech fluent and appropriate  PEERL 4mm.   EOMI, no nystagmus  FS, no droop  TM  5/5 BUE  5/5 BLE except 4/5 Right plantar flex  2+ brachioradialis and knee jerk  SILT  GCS 15  Incision: CDI. No erythema. Left steri strips in place.     Labs:     Lab Results   Component Value Date    WBC 10.46 12/15/2012    HGB 11.2* 12/15/2012    HCT 34.8* 12/15/2012    MCV 78.2* 12/15/2012    PLT 199 12/15/2012     Lab Results   Component Value Date    NA 139 12/14/2012    K 3.8 12/14/2012    CL 107 12/14/2012    CO2 19* 12/14/2012     Lab Results   Component Value Date    INR 1.0 11/27/2012    PT 13.5 11/27/2012     Lab Results   Component Value Date    BUN 6.0* 12/14/2012     Lab Results   Component Value Date    CREAT 0.7 12/14/2012       Assesment:   27 y/o F POD #2 12/5 L5-S1 TLIF. Neuro stable. Afebrile now. No Abx given. IS encouraged. Ucx, Bcx drawn 12/7  pending    Plan:   Continue OOB w LSO brace  Planing for D/c to home tomorrow if stable    Signed by: Chad Cordial PA-C  Date/Time: 12/15/2012 4:47 PM

## 2012-12-15 NOTE — Op Note (Signed)
Procedure Date: 12/13/2012     Patient Type: I     SURGEON: Marlaine Hind MD  ASSISTANT:  Vladimir Creeks Vacaflor CSA     PREOPERATIVE DIAGNOSES:  L5-S1 degenerative disk disease with axial pain and left lumbar radicular  pain.     POSTOPERATIVE DIAGNOSES:  L5-S1 degenerative disk disease with axial pain and left lumbar radicular  pain.     TITLE OF PROCEDURE:  1.  Left redo L5-S1 hemilaminectomy and medial facetectomy.  2.  Left transforaminal diskectomy utilizing microsurgical technique.  3.  Interbody fusion L5-S1 with placement of a PEEK cage filled with  allograft and autograft.  4.  Posterior segmental fixation with pedicle screws bilaterally placed at  L5-S1 with connecting rods.    5.  Posterolateral fusion with allograft and autograft at L5-S1.       INDICATIONS:  [name] [name] is a 27 year old woman prior history of a microdiskectomy L5-S1  with worsening back pain and leg pain.  Imaging study showed a disk  protrusion with degeneration at L5-S1.  The patient had failed nonoperative  measures and therefore, the above procedure was indicated.  Risks, benefits  were discussed, the risks including bleeding, infection, and injury to the  nerve roots, CSF leak, nerve pain, paresis, paralysis, hardware failure or  hardware misplacement, need for further surgery and nonunion, and the  patient has elected to proceed.     DESCRIPTION OF PROCEDURE:  The patient was brought into the operating room, intubated in supine  position, placed on a prone position on a Jackson table.  She was padded  and prepped and draped in regular sterile fashion.  Neural monitoring was  arranged and neural monitoring throughout the case and remained stable, and  all the pedicle screws stimulated greater than 12 milliamps.       A midline incision was made, extended down to the spinous processes of  L5-S1.  Bilateral subperiosteal dissection was carried out.  The old scar  on the left side was opened and removed.  The dissection was carried out  to  the TPs bilaterally at L5 and S1.  The pedicle screws were then placed  bilaterally at L5-S1 utilizing the mammary process as the entry point with  pedicle finder, followed by a tap, followed by  palpation with a ball-tip probe, followed by placement of the pedicle  screw.  Then 5.5 x 40-mm screws were placed at L5.  These were of Zimmer  spine origin and 5.5 5 x 35-mm screws were placed at S1 bilaterally.  All  holes were palpated prior to placement of the screw and no breach was noted  anatomically, and all screws were stimulated after placement and they all  stimulated at greater than 12 milliamps, indicating no electrophysiological  breech.       Following placement of the pedicle screws, a hemilaminectomy was performed  by bringing in the microscope, which was draped, shaving down the spinous  process and lamina on the left side, thinning the lamina and removal with  the 2 and 3-mm Kerrison rongeur.  A  facetectomy was performed of the  inferior facet at L5 and superior facet at L4 utilizing the high-speed  drill.  Scar tissue was dissected through, and the remnant of the  ligamentum flavum was removed.       The decompression was carried out to the foramina at L5-S1 on the left.   With decompression of the exiting nerve root, the traversing nerve root was  visualized.  It was then mobilized slightly medially, and annulotomy was  performed followed by diskectomy utilizing shavers, rongeurs, and pituitary  rongeurs, followed by curettes to prepare the endplate.  After complete  diskectomy and preparation of endplates, sizers were placed, and up to a 9  sizer was found to be the appropriate fit.  At this point, a PEEK cage was  filled with allograft, and articulating cage was then placed in the  anterior portion of the disk space at L5-S1 and across the midline.   Additional bone graft was placed in front of and behind the cage to  facilitate a solid bony fusion in the interbody space.     At this point, the  posterolateral gutters were decorticated.  Bone was  packed around the pedicle screws.  A connecting rod was measured and  placed.  The connecting rod was 40 mm in length.  The locking caps were  placed and then tightened to their final torque strength.  Prior to final  tightening, compression of the interbody space was performed.  The  paraspinal musculature was then infiltrated with Marcaine with epinephrine,  and the wound was closed in regular fashion with 2-0 Vicryl through the  paraspinal musculature followed by closure of the dorsal fascia with 0  Vicryl, followed by closure of the subcuticular adipose tissue with 2-0  Vicryl, followed by closure of the dermis with 3-0 Vicryl, followed by  running 4-0 Monocryl stitch through the subcuticular plane.  The wound was  dressed with Steri-Strips, gauze, and Tegaderm.  All needle and sponge  counts were correct prior to closure.  The patient was returned to the  supine position, extubated, and transferred to recovery.         JfH

## 2012-12-15 NOTE — Plan of Care (Signed)
AAOx4. Anxious sometime. Ambulate in the hallway with brace on. Dressing CDI. Voiding in the bathroom. Pt verbalized she passed lil bit of gas, but have gas pain. MD notified. Suppository given as ordered. Tele tachy. Prn pain medication given as needed. Pt c/o nausea but refused zofran. Educated about using IS. Family at bedside. Call bell in reach. Will continue monitor the pt.

## 2012-12-15 NOTE — H&P (Signed)
[facility] ADMISSION HISTORY AND PHYSICAL EXAM    Date Time: 12/15/2012 5:02 AM  Patient Name: [name] [name]  Attending Physician: Marlaine Hind, MD  Primary Care Physician: Christa See, MD    CC: consult for medical management (fever, tachycardia)      History of Presenting Illness:   [name] [name] is a 27 y.o. female w/ho IBS, allergic rhinitis, GERD, disk herniation with DDD L5-S1 who was admitted for elective L5/S1 TLIF 12/13/12.  Consulted for fever this AM (102F) and tachycardia.  Pt reports moderate pain in her back and numbness of LLE foot.  Reports a rash over her face that she noticed in the mirror, thinks it could be a side effect of a medication.  Denies pruritus at this time.  Feels "warm all over" her body.  Also reports mild discomfort in her LLQ.  Has not had a BM and reports urinary hesitancy.  Denies dyspnea, calf pain.    Past Medical History:     Past Medical History   Diagnosis Date   . Pancreatitis    . Colitis    . Complication of anesthesia      STATES WOKE-UP DURING SURGERY   . Cold 11-30-12     ABX=TOOK UNTIL 12-06-12   . Seasonal allergies    . Gastroesophageal reflux disease    . Low back pain      LUMBAR DISK HERNIATIONS   . Irritable bowel syndrome    . PCOS (polycystic ovarian syndrome)        Available old records reviewed, including:      Past Surgical History:     Past Surgical History   Procedure Date   . Back surgery 2009     LUMBAR   . Lasik    L5-S1 microdiskectomy (2009)    Family History:   Parents- HTN    Social History:     History     Social History   . Marital Status: Married     Spouse Name: N/A     Number of Children: N/A   . Years of Education: N/A     Social History Main Topics   . Smoking status: Never Smoker    . Smokeless tobacco: Never Used   . Alcohol Use: No   . Drug Use: No   . Sexually Active:      Other Topics Concern   . Not on file     Social History Narrative   . No narrative on file       Allergies:     Allergies    Allergen Reactions   . Morphine        Medications:     Prescriptions prior to admission   Medication Sig   . cetirizine (ZYRTEC) 10 MG tablet Take 5 mg by mouth nightly.   . fluticasone (FLONASE) 50 MCG/ACT nasal spray 2 sprays by Nasal route 2 (two) times daily.   Marland Kitchen OMEPRAZOLE PO Take 20 mg by mouth every morning.    . [DISCONTINUED] Acetaminophen (TYLENOL 8 HOUR PO) Take by mouth daily as needed.   . norgestimate-ethinyl estradiol (ORTHO-CYCLEN) 0.25-35 MG-MCG per tablet Take 1 tablet by mouth daily.   . [DISCONTINUED] Pregabalin (LYRICA PO) Take by mouth.       Current Facility-Administered Medications   Medication Dose Route Frequency   . cetirizine  5 mg Oral QHS   . fentaNYL  1 patch Transdermal Q72H   . fluticasone  2 spray Each Nare Q12H  SCH   . gabapentin  400 mg Oral Q8H   . norgestrel-ethinyl estradiol  1 tablet Oral Daily   . oxyCODONE  10 mg Oral Q12H SCH   . pantoprazole  40 mg Oral QAM AC   . senna-docusate  1 tablet Oral BID       Review of Systems:   All other systems were reviewed and are negative except: per HPI    Physical Exam:     Filed Vitals:    12/15/12 0414   BP:    Pulse: 121   Temp:    Resp:    SpO2: 94%       Intake and Output Summary (Last 24 hours) at Date Time    Intake/Output Summary (Last 24 hours) at 12/15/12 0502  Last data filed at 12/14/12 2200   Gross per 24 hour   Intake    250 ml   Output    925 ml   Net   -675 ml       General: awake, alert, oriented x 3; no mild distress after walking from bathroom  HEENT: perrla, eomi, sclera anicteric  oropharynx clear without lesions, mucous membranes moist  Neck: supple, no lymphadenopathy, no thyromegaly, no JVD, no carotid bruits  Cardiovascular: regular rate and rhythm, no murmurs, rubs or gallops  Lungs: clear to auscultation bilaterally, without wheezing, rhonchi, or rales  Abdomen: soft, non-tender, non-distended; no palpable masses, no hepatosplenomegaly, normoactive bowel sounds, no rebound or guarding; brace  removed  Extremities: no clubbing, cyanosis, or edema  Neuro: cranial nerves grossly intact, strength 5/5 in upper and lower extremities, sensation intact,   Skin: lumbar dressing c/d/i, few lesions (round, excoriated) on face   Other:      Labs:     Results     Procedure Component Value Units Date/Time    Basic Metabolic Panel (QAM x 1) [[id number]]  (Abnormal) Collected:12/14/12 0412    Specimen Information:Blood Updated:12/14/12 0534     Glucose 104 (H) mg/dL      BUN 6.0 (L) mg/dL      Creatinine 0.7 mg/dL      CALCIUM 9.1 mg/dL      Sodium 191 mEq/L      Potassium 3.8 mEq/L      Chloride 107 mEq/L      CO2 19 (L) mEq/L     GFR [[id number]] Collected:12/14/12 0412     EGFR >60.0 Updated:12/14/12 0534    CBC  [[id number]]  (Abnormal) Collected:12/14/12 0412    Specimen Information:Blood / Blood Updated:12/14/12 0513     WBC 12.84 (H)      RBC 4.38      Hgb 10.7 (L) g/dL      Hematocrit 96.2 (L) %      MCV 76.9 (L) fL      MCH 24.4 (L) pg      MCHC 31.8 (L) g/dL      RDW 15 %      Platelets 207      MPV 11.1 fL      Nucleated RBC 0           Imaging personally reviewed, including:     Fluoroscopy greater than 1 hour [[id number]] Collected:12/13/12 1406 Order Status:Completed Updated:12/13/12 1411 Narrative: History: L5-S1 laminectomy    Fluoroscopic guidance was provided for this operative procedure,  performed without the presence of a radiologist. Fluoroscopy time 49.9  seconds.  Impression: Impression: Fluoroscopy provided.         Will Bonnet, MD  12/13/2012 2:06 PM        Assessment:     Lumbar DDD s/p L5/S1 TLIF (POD #1)  Fever  Tachycardia    Plan:     1.  Fever: unknown origin  -  R/o infectious etiology: CXR, blood cx, UA/Cx.  -  Tylenol prn.  -  IVF.  -  Monitor CBC q12hrs.    2.  Tachycardia:  -  Pain control.  -  Check D-dimer.  -  Monitor on telemetry.    3.  Protonix for GI ppx.    4.  SCDs for DVT ppx.      Status/Rationale:  Await labs.  Will follow with you.    Signed by: Rayna Sexton, MD   cc:Pcp,  Octaviano Glow, MD

## 2012-12-16 DIAGNOSIS — R509 Fever, unspecified: Secondary | ICD-10-CM

## 2012-12-16 LAB — CBC AND DIFFERENTIAL
Basophils Absolute Automated: 0.03 (ref 0.00–0.20)
Basophils Automated: 0 %
Eosinophils Absolute Automated: 0.21 (ref 0.00–0.70)
Eosinophils Automated: 2 %
Hematocrit: 33.2 % — ABNORMAL LOW (ref 37.0–47.0)
Hgb: 10.6 g/dL — ABNORMAL LOW (ref 12.0–16.0)
Immature Granulocytes Absolute: 0.02
Immature Granulocytes: 0 %
Lymphocytes Absolute Automated: 2.87 (ref 0.50–4.40)
Lymphocytes Automated: 30 %
MCH: 24.9 pg — ABNORMAL LOW (ref 28.0–32.0)
MCHC: 31.9 g/dL — ABNORMAL LOW (ref 32.0–36.0)
MCV: 77.9 fL — ABNORMAL LOW (ref 80.0–100.0)
MPV: 11.3 fL (ref 9.4–12.3)
Monocytes Absolute Automated: 0.74 (ref 0.00–1.20)
Monocytes: 8 %
Neutrophils Absolute: 5.81 (ref 1.80–8.10)
Neutrophils: 60 %
Nucleated RBC: 0 (ref 0–1)
Platelets: 194 (ref 140–400)
RBC: 4.26 (ref 4.20–5.40)
RDW: 15 % (ref 12–15)
WBC: 9.68 (ref 3.50–10.80)

## 2012-12-16 MED ORDER — OXYCODONE-ACETAMINOPHEN 5-325 MG PO TABS
2.0000 | ORAL_TABLET | ORAL | Status: DC | PRN
Start: 2012-12-16 — End: 2017-04-26

## 2012-12-16 MED ORDER — SODIUM CHLORIDE 0.9 % IV MBP
1.0000 g | INTRAVENOUS | Status: DC
Start: 2012-12-16 — End: 2012-12-16
  Administered 2012-12-16: 1 g via INTRAVENOUS
  Filled 2012-12-16: qty 1000

## 2012-12-16 MED ORDER — GABAPENTIN 400 MG PO CAPS
400.0000 mg | ORAL_CAPSULE | Freq: Three times a day (TID) | ORAL | Status: DC
Start: 2012-12-16 — End: 2018-05-22

## 2012-12-16 NOTE — PT Eval Note (Signed)
[facility]   Physical Therapy Evaluation   Patient: [name] [name]    MRN#: [id number]   Unit: [facility] TOWER 8  Bed: X528/U132.44    Discharge Recommendations:   Discharge Recommendation: Home with home health PT;Home with supervision (PRN family assist)   DME Recommendation: DME Recommended for Discharge:  (RW, RTS, tub/shower seat)    Assessment:   [name] [name] is a 27 y.o. female admitted 12/13/2012.  Pt presents with impaired mobility due to decreased strength, decreased endurance, decreased balance.  Pt will benefit from PT intervention to maximize functional independence.       Therapy Diagnosis: Impaired mobility    Rehabilitation Potential: Prognosis: Good;With continued PT status post acute discharge    Treatment Activities:   PT evaluation, bed mobility, transfers, gait training, therapeutic exercise and pt/family education , see below.      Patient Education:  Educated the patient to role of physical therapy, plan of care, goals and benefits of therapy and activity/mobilization, safety with mobility, spinal precautions, D/C recommendations.        Plan:   Treatment/Interventions: Exercise;Gait training;Functional transfer training;Endurance training;Patient/family training;Equipment eval/education;Bed mobility;Compensatory technique education     PT Frequency: 4-5x/wk     Risks/Benefits/POC Discussed with Pt/Family: With patient/family        Precautions and Contraindications:   Falls  Spine    Activity orders:  Activity as tolerated with brace when OOB  Ambulate patient    Consult received for [name] [name] for PT Evaluation and Treatment.    Patient's medical condition is appropriate for Physical therapy intervention at this time.    Medical Diagnosis: DDD (degenerative disc disease), lumbar [722.52] (DDD (degenerative disc disease), lumbar [722.52])  Lumbar radiculopathy [724.4] (Lumbar radiculopathy [724.4])  Lumbar stenosis  Lumbar  stenosis      History of Present Illness:   [name] [name] is a 27 y.o. female admitted on 12/13/2012    PREOPERATIVE DIAGNOSES:  L5-S1 degenerative disk disease with axial pain and left lumbar radicular  pain.    TITLE OF PROCEDURE:  1.  Left redo L5-S1 hemilaminectomy and medial facetectomy.  2.  Left transforaminal diskectomy utilizing microsurgical technique.  3.  Interbody fusion L5-S1 with placement of a PEEK cage filled with  allograft and autograft.  4.  Posterior segmental fixation with pedicle screws bilaterally placed at  L5-S1 with connecting rods.     5.  Posterolateral fusion with allograft and autograft at L5-S1.       Following, elective L5/S1 TLIF 12/13/12.  Consulted was for fever (102F) and tachycardia.  Pt reports moderate pain in her back and numbness of LLE foot.  Reports a rash over her face that she noticed in the mirror, thinks it could be a side effect of a medication.  Denies pruritus at this time.  Feels "warm all over" her body.  Also reports mild discomfort in her LLQ.  Has not had a BM and reports urinary hesitancy.  Denies dyspnea, calf pain.          Past Medical/Surgical History:    Past Medical History:        Past Medical History    Diagnosis  Date    .  Pancreatitis      .  Colitis      .  Complication of anesthesia          STATES WOKE-UP DURING SURGERY    .  Cold  11-30-12  ABX=TOOK UNTIL 12-06-12    .  Seasonal allergies      .  Gastroesophageal reflux disease      .  Low back pain          LUMBAR DISK HERNIATIONS    .  Irritable bowel syndrome      .  PCOS (polycystic ovarian syndrome)          Available old records reviewed, including:        Past Surgical History:        Past Surgical History    Procedure  Date    .  Back surgery  2009        LUMBAR    .  Lasik      L5-S1 microdiskectomy (2009)          X-Rays/Tests/Labs:  XR Chest AP Portable [[id number]] Collected:12/15/12 Impression:     Minimal left basilar patchy opacity, nonspecific, either  mild  atelectasis or developing/mild left lower lobe pneumonia.     Social History:   Prior Level of Function:  Prior level of function: Ambulates / Performs ADL's independently  Assistive Device: None  Baseline Activity Level: Community ambulation  DME Currently at Home:  (None)    Home Living Arrangements:  Living Arrangements: Spouse/significant other (Mother and mother-in-law will be there able to assist)  Type of Home: Apartment  Home Layout: One level (12 STE with rails)  Bathroom Shower/Tub: Medical sales representative: Standard  DME Currently at Home:  (None)    Subjective:   Patient is agreeable to participation in the therapy session. Family and/or guardian are agreeable to patient's participation in the therapy session. Nursing clears patient for therapy.     Patient Goal:  (Home)      Pain Assessment:  Pain (rating/location/intervention):  9/10, back pain, positioned to comfort    Cognition  Intact, pleasant and cooperative.  Mildly sleepy.      Objective:   Patient is in bed with telemetry and peripheral IV in place.  Brace donned with mod-max assist.      Musculoskeletal Examination:  Gross ROM  Right Upper Extremity ROM: within functional limits  Left Upper Extremity ROM: within functional limits  Right Lower Extremity ROM: within functional limits  Left Lower Extremity ROM: within functional limits    Gross Strength  Right Upper Extremity Strength: within functional limits  Left Upper Extremity Strength: within functional limits  Right Lower Extremity Strength: within functional limits  Left Lower Extremity Strength: within functional limits         Functional Mobility:  Rolling:  (SB-CG)  Supine to Sit:  (SB-CG)  Sit to Stand:  (CG-min)  Stand to Sit:  (CG)         Ambulation:  Ambulation:  (CG-min without device, SB-CG with RW)  Ambulation Distance (Feet):  (125'x2)  Pattern: R foot decreased clearance;L foot decreased clearance;decreased cadence;decreased step length  Stair Management:  sideways;minimal assistance (bilateral hand-hold on single rail)  Number of Stairs: 2      Balance:  Sitting (static/dynamic): I/SB  Standing (static/dynamic):  SB/SB-CG         Participation and Activity Tolerance:  Participation Effort: good  Endurance:  (Fair+)      Patient left with call bell within reach, all needs met and all questions answered, RN notified of session outcome and patient response.      Goals:   Goals  Goal Formulation: With patient/family  Time for Goal Acheivement: 3 visits  Goals: Select goal  Pt Will Go Supine To Sit: Independently  Pt Will Transfer Bed/Chair: With stand by assist  Pt Will Ambulate: > 200 feet;With stand by assist (with or without LRAD as needed for safety)  Pt Will Go Up / Down Stairs: 3-5 stairs (SB-CG)  Other Goal:  (Pt/family independent with don/doff of brace)         Time of treatment:   PT Received On: 12/16/12  Start Time: 0900  Stop Time: 1015  Time Calculation (min): 75 min      Thank you,    Suzan Garibaldi, PT, Pager [phone]

## 2012-12-16 NOTE — Progress Notes (Signed)
Patient will need outpt scripts for PT and OT.  Patient will have to pay for the RW.  CCM Armando Reichert said she would page the doctor.  Outpt scripts would be written by the neurosurgery resident.        Lenn Sink, MSW  Discharge Planner (Case Management)  [facility]  [phone]

## 2012-12-16 NOTE — Progress Notes (Signed)
Patient was assessed financially.  Pt needs a RW and Home PT.        Lenn Sink, MSW  Discharge Planner (Case Management)  [facility]  [phone]

## 2012-12-16 NOTE — Progress Notes (Signed)
Neurosurgery Progress Note  Doing well, no complaints.   Awake, alert, Ox3  Speech appropriate  BUE strength 5/5  BLE strength 5/5  Sensation intact to light touch  Incision under dressing/dressing c/d/i  A/P: POD#3 s/p L5-S1 TLIF          D/C home          Rx for pain medication given          Order for home PT and rolling walker          Follow up in clinic for incision check in 2 weeks           Follow up with Dr. Deloria Lair in 4-6 weeks           OOB with LSO    Birder Robson, PA-C

## 2012-12-16 NOTE — Progress Notes (Signed)
Neurosurgery resident said she would write outpt scripts.  Pt will have to buy the RW.        Lenn Sink, MSW  Discharge Planner(Case Management)  [facility]  [phone]

## 2012-12-16 NOTE — Progress Notes (Signed)
[facility] HOSPITALIST PROGRESS NOTE    Date Time: 12/16/2012 5:54 PM  Patient Name: [name] [name]  Attending Physician: Marlaine Hind, MD    Assessment:   1. Fever likely post op vs atelectasis, resolved  2. Tachycardia secondary to fever, resolved  3. Abnormal UA with no significant growth on culture  4. Constipation  5. L5-S1 degenerative disk disease with axial pain and left lumbar radicular pain s/p Left redo L5-S1 hemilaminectomy and medial facetectomy    Plan:    Ceftriaxone one dose was given before the culture result, she was having some suprapubic discomfort. But now no need of continuing antibiotics.    Can be discharged from our standpoint       Case discussed with: pt and husband         Subjective     CC: consulted for fever and tachycardia     Interval History/24 hour events/subjective: fever resolved. No palpitation. Abdominal distension better. Having some suprapubic discomfort     HPI:[name] [name] is a 27 y.o. female w/ho IBS, allergic rhinitis, GERD, disk herniation with DDD L5-S1 who was admitted for elective L5/S1 TLIF 12/13/12. Consulted for fever this AM (102F) and tachycardia.      Review of Systems:     See above    Physical Exam:     VITAL SIGNS PHYSICAL EXAM   Temp:  [98 F (36.7 C)-99.4 F (37.4 C)] 99.1 F (37.3 C)  Heart Rate:  [92-131] 94   Resp Rate:  [16-18] 16   BP: (97-118)/(55-77) 118/61 mmHg  Blood Glucose:    Telemetry:ns     No intake or output data in the 24 hours ending 12/16/12 1754 General: No apparent distress, comfortable appearing, vital signs noted   Eyes: Anicteric sclerae, moist conjunctiva, PERRLA   ENT: Oropharynx clear; no erythema or exudates;   Neck: supple; FROM; no mass   Resp: Clear to ausculation, no wheezing, rhonci, or rales noted; normal respiratory effort   CV: Regular rate and rhythm, no murmurs, gallops, or rubs; no peripheral edema   GI: mild suprapubic tenderness, positive BS  Skin: Normal temperature, tone, turgor; no rash,  lesions, ulcers   Musc: No digital cyanosis or ischemia   Neuro: AOx3, CN 2-12 grossly intact,            Meds:     Medications were reviewed:    Labs:     Labs (last 72 hours):      Lab 12/16/12 0450 12/15/12 0531   WBC 9.68 10.46   HGB 10.6* 11.2*   HCT 33.2* 34.8*   PLT 194 199       No results found for this basename: PT:2,INR:2,PTT:2 in the last 168 hours   Lab 12/14/12 0412   NA 139   K 3.8   CL 107   CO2 19*   BUN 6.0*   CREAT 0.7   CA 9.1   ALB --   PROT --   BILITOTAL --   ALKPHOS --   ALT --   AST --   GLU 104*                   Microbiology, reviewed and are significant for:  Microbiology Results     Procedure Component Value Units Date/Time    CULTURE BLOOD AEROBIC AND ANAEROBIC [[id number]] Collected:12/15/12 0530    Specimen Information:Blood, Venipuncture Updated:12/16/12 1015    Narrative:    ORDER#: [id number]  ORDERED BY: BAHK, ESTHER  SOURCE: Blood, Venipuncture peripheral               COLLECTED:  12/15/12 05:30  ANTIBIOTICS AT COLL.:                                RECEIVED :  12/15/12 09:59  Culture Blood Aerobic and Anaerobic        PRELIM      12/16/12 10:15  12/16/12   No Growth after 1 day/s of incubation.      Urine culture [[id number]] Collected:12/15/12 1042    Specimen Information:Urine / Urine, Clean Catch Updated:12/16/12 1403    Narrative:    ORDER#: [id number]                                    ORDERED BY: Sharen Hones  SOURCE: Urine, Clean Catch                           COLLECTED:  12/15/12 10:42  ANTIBIOTICS AT COLL.:                                RECEIVED :  12/15/12 13:44  Culture Urine                              FINAL       12/16/12 14:03   +  12/16/12   5,000 CFU/ML Gram negative rod               Lactose fermenting gram negative rod, No further work,             Questionable significance due to low quantity.              Imaging, reviewed and are significant for:  Radiology Results (24 Hour)     ** No Results found for the last 24 hours. **             Signed by: Durenda Hurt, MD

## 2012-12-16 NOTE — Plan of Care (Signed)
Pt AAOx4. Pain controlled by prn pain medication. Need reenforcement to do ambulate and do things. Dressing CDI. Ambulate in the hallway with walker and stand by assist with brace on. VSS. Discharge teaching given. IV out. Answer all the questions. Walker given to pt. Pt out of unit in wheelchair.

## 2012-12-16 NOTE — Progress Notes (Signed)
CCM received call from [place], [place] [id number] stating pt has been approved by Angelique Blonder, CM II for charity outpatient PT not home PT.   Was informed that pt must pay for her own RW.  CC[place] [phone] as the discharging PA Birder Robson is being covered by them at this time.   Yvonne Kendall, SW is aware of the paging plan to obtain order.     Clelia Croft, RN  Clinical Case Manager  Phone [phone]  [facility]

## 2012-12-18 MED FILL — Gabapentin Cap 100 MG: ORAL | Qty: 8 | Status: AC

## 2012-12-19 ENCOUNTER — Ambulatory Visit: Payer: Charity

## 2012-12-23 ENCOUNTER — Telehealth (INDEPENDENT_AMBULATORY_CARE_PROVIDER_SITE_OTHER): Payer: Self-pay

## 2012-12-23 ENCOUNTER — Ambulatory Visit (INDEPENDENT_AMBULATORY_CARE_PROVIDER_SITE_OTHER): Payer: Charity

## 2012-12-23 VITALS — Temp 98.5°F | Wt 173.0 lb

## 2012-12-23 NOTE — Progress Notes (Signed)
The pt and spouse is here for nurse visit wound check for L5S1 TLIF performed on 12/13/12 by Dr. Deloria Lair. The posterior incision site was inspected and remains C/D/I. The steri strips were ready to be removed and same done gently. The wound borders remain intact. The pt reports some swelling around incision site but none observed. The spouse also was shown the area and did not see any swelling at this time. The pt also said the area felt warm on either site of the incision site but the skin felt normal to the touch. The pt has requested handicapped parking permit and a refill on Flexeril. I will phone in the refill on the medication and introduce them to Ms. Lampkin, LPN for f/u with any papterwork. The pt was instructed to shower as normal allowing clean water to run over incision site. Reminded pt no heavy lifting over 5 lbs, twisting or bending motion and to continue to wear brace until discontinued by Dr. Deloria Lair. All questions answered. The pt has a f/u appt with Dr. Deloria Lair on 01/15/13.

## 2012-12-23 NOTE — Telephone Encounter (Signed)
pt here for nurse visit wound check and requests refill on Flexeril 5 mg tab 3xdaily, #30 no refills-called into [facility], pharmacist.

## 2012-12-24 ENCOUNTER — Encounter: Payer: Self-pay | Admitting: Neurological Surgery

## 2012-12-31 NOTE — Discharge Summary (Signed)
Admit 12/13/2012  [place] 12/16/2012  DX Lumbar DDD with radiculopathy  Procedure L5/S1 TLIF  Dispo Home with brace.  Followup in 10 - 14 days  Instructions No bending lifting or twisting   Meds Neurontin, Flexeril, Fentanyl patch

## 2013-01-13 ENCOUNTER — Other Ambulatory Visit (INDEPENDENT_AMBULATORY_CARE_PROVIDER_SITE_OTHER): Payer: Self-pay

## 2013-01-13 DIAGNOSIS — M5416 Radiculopathy, lumbar region: Secondary | ICD-10-CM

## 2013-01-13 DIAGNOSIS — M5136 Other intervertebral disc degeneration, lumbar region: Secondary | ICD-10-CM

## 2013-01-13 DIAGNOSIS — M545 Low back pain, unspecified: Secondary | ICD-10-CM

## 2013-01-15 ENCOUNTER — Ambulatory Visit (INDEPENDENT_AMBULATORY_CARE_PROVIDER_SITE_OTHER): Payer: Charity | Admitting: Neurological Surgery

## 2013-01-15 ENCOUNTER — Encounter (INDEPENDENT_AMBULATORY_CARE_PROVIDER_SITE_OTHER): Payer: Self-pay | Admitting: Neurological Surgery

## 2013-01-15 ENCOUNTER — Ambulatory Visit
Admission: RE | Admit: 2013-01-15 | Discharge: 2013-01-15 | Disposition: A | Discharge status: Home / Self Care | Payer: Charity | Source: Ambulatory Visit | Attending: Neurological Surgery | Admitting: Neurological Surgery

## 2013-01-15 VITALS — BP 122/74 | HR 90 | Wt 171.0 lb

## 2013-01-15 DIAGNOSIS — M51379 Other intervertebral disc degeneration, lumbosacral region without mention of lumbar back pain or lower extremity pain: Secondary | ICD-10-CM | POA: Insufficient documentation

## 2013-01-15 DIAGNOSIS — IMO0002 Reserved for concepts with insufficient information to code with codable children: Secondary | ICD-10-CM | POA: Insufficient documentation

## 2013-01-15 DIAGNOSIS — M5137 Other intervertebral disc degeneration, lumbosacral region: Secondary | ICD-10-CM

## 2013-01-15 DIAGNOSIS — M545 Low back pain, unspecified: Secondary | ICD-10-CM | POA: Insufficient documentation

## 2013-01-15 DIAGNOSIS — M48061 Spinal stenosis, lumbar region without neurogenic claudication: Secondary | ICD-10-CM

## 2013-01-15 DIAGNOSIS — M5136 Other intervertebral disc degeneration, lumbar region: Secondary | ICD-10-CM

## 2013-01-16 NOTE — Progress Notes (Signed)
Service Date: 01/15/2013     Patient Type: O     PHYSICIAN/PROVIDER: Marlaine Hind MD     HISTORY OF PRESENT ILLNESS:  Ms. Vining presents for followup status post L5-S1 TLIF.  She is doing  quite well.  The radicular symptoms down her left leg have largely  subsided.  She still has some tingling.  The pain that was traveling down  her leg has also subsided.  She still has some "tightness" in her back, but  overall she is getting by without any pain medications, except for  acetaminophen over-the-counter.  She also does take Neurontin 400 mg at  night.     PHYSICAL EXAMINATION:  She is afebrile, vital signs are stable.  Cranial nerves II through XII are  intact.  She has got 5/5 strength in lower extremities.  Psoas,  quadriceps, dorsiflexion, plantarflexion and straight leg testing is  negative.  Sensation is intact to light touch.  Wounds are well healed.     IMAGING STUDIES:  X-rays show good positioning of the hardware and instrumentation.     ASSESSMENT:  Status post transforaminal lumbar interbody fusion dated December 12, 2012.     PLAN:  The patient should continue in the brace.  She will follow up with me in 2  months.  She has filled out an ODI questionnaire.  I think she will  continue to improve.  I look forward to seeing her in 2 months.           JFH

## 2013-03-11 ENCOUNTER — Other Ambulatory Visit (INDEPENDENT_AMBULATORY_CARE_PROVIDER_SITE_OTHER): Payer: Self-pay

## 2013-03-11 DIAGNOSIS — M48061 Spinal stenosis, lumbar region without neurogenic claudication: Secondary | ICD-10-CM

## 2013-03-11 DIAGNOSIS — M5136 Other intervertebral disc degeneration, lumbar region: Secondary | ICD-10-CM

## 2013-03-11 DIAGNOSIS — M5416 Radiculopathy, lumbar region: Secondary | ICD-10-CM

## 2013-03-17 ENCOUNTER — Ambulatory Visit
Admission: RE | Admit: 2013-03-17 | Discharge: 2013-03-17 | Disposition: A | Discharge status: Home / Self Care | Payer: Charity | Source: Ambulatory Visit | Attending: Neurological Surgery | Admitting: Neurological Surgery

## 2013-03-17 DIAGNOSIS — M51379 Other intervertebral disc degeneration, lumbosacral region without mention of lumbar back pain or lower extremity pain: Secondary | ICD-10-CM | POA: Insufficient documentation

## 2013-03-17 DIAGNOSIS — IMO0002 Reserved for concepts with insufficient information to code with codable children: Secondary | ICD-10-CM | POA: Insufficient documentation

## 2013-03-17 DIAGNOSIS — M48061 Spinal stenosis, lumbar region without neurogenic claudication: Secondary | ICD-10-CM | POA: Insufficient documentation

## 2013-03-17 DIAGNOSIS — M5137 Other intervertebral disc degeneration, lumbosacral region: Secondary | ICD-10-CM | POA: Insufficient documentation

## 2013-03-19 ENCOUNTER — Ambulatory Visit (INDEPENDENT_AMBULATORY_CARE_PROVIDER_SITE_OTHER): Payer: Charity | Admitting: Neurological Surgery

## 2013-03-19 VITALS — BP 120/84 | HR 102 | Wt 167.0 lb

## 2013-03-19 DIAGNOSIS — M5430 Sciatica, unspecified side: Secondary | ICD-10-CM

## 2013-03-19 DIAGNOSIS — M5136 Other intervertebral disc degeneration, lumbar region: Secondary | ICD-10-CM

## 2013-03-19 DIAGNOSIS — IMO0002 Reserved for concepts with insufficient information to code with codable children: Secondary | ICD-10-CM

## 2013-03-19 DIAGNOSIS — M5416 Radiculopathy, lumbar region: Secondary | ICD-10-CM

## 2013-03-19 DIAGNOSIS — M5440 Lumbago with sciatica, unspecified side: Secondary | ICD-10-CM

## 2013-03-19 DIAGNOSIS — M5137 Other intervertebral disc degeneration, lumbosacral region: Secondary | ICD-10-CM

## 2013-03-19 NOTE — Patient Instructions (Addendum)
Begin to Wean From Brace:     Week 1, reduce by 6 hours/day  Week 2, reduce by 12 hours /day  Week 3, reduce by 18 hours/day  Week 4, reduce by  24 hrs/day    Follow up in 3 months no x-rays needed  No bending/lifting/twisting  Begin PT

## 2013-04-01 ENCOUNTER — Ambulatory Visit
Admission: RE | Admit: 2013-04-01 | Discharge: 2013-04-01 | Disposition: A | Discharge status: Home / Self Care | Payer: Charity | Source: Ambulatory Visit | Attending: Neurological Surgery | Admitting: Neurological Surgery

## 2013-04-01 DIAGNOSIS — M5137 Other intervertebral disc degeneration, lumbosacral region: Secondary | ICD-10-CM | POA: Insufficient documentation

## 2013-04-01 DIAGNOSIS — M545 Low back pain, unspecified: Secondary | ICD-10-CM | POA: Insufficient documentation

## 2013-04-01 DIAGNOSIS — M51379 Other intervertebral disc degeneration, lumbosacral region without mention of lumbar back pain or lower extremity pain: Secondary | ICD-10-CM | POA: Insufficient documentation

## 2013-04-01 NOTE — PT Eval Note (Signed)
Physical Therapy Spine Evaluation          [facility]   [address]   [place], [place] [address]   [phone]     Patient:  [name] [name]                                                   BJY#:[id number]      Start of Care:   04/01/2013                                                   No of Visits:    Time of treatment:  Start Time :    0830                           Stop Time: 0930    Evaluation    Therapeutic Ex  15 mins         Precautions/Contraindications: S/P L5-S1 TLIF on 12/13/2012  FALLS RISK: No    Diagnosis: L5-S1 TLIF, L-DDD, Lumbar Radicular pain.        Referring MD: Irene Shipper. Hamilton, MD    History:  H/O L5-S1 TLIF on 12/13/2012.  TITLE OF PROCEDURE:   1. Left redo L5-S1 hemilaminectomy and medial facetectomy.   2. Left transforaminal diskectomy utilizing microsurgical technique.   3. Interbody fusion L5-S1 with placement of a PEEK cage filled with   allograft and autograft.   4. Posterior segmental fixation with pedicle screws bilaterally placed at   L5-S1 with connecting rods.   5. Posterolateral fusion with allograft and autograft at L5-S1.     C/O pain in both LEs. Feels very tight. Tries to be active. Walks for 1/2 hour daily and does light house work. Wakes up 3-4 times every night, able to get back to sleep easily.    Social:   Married, lives with family. Not working at this time.  Prior level of function:  Limited with back pain.  Cognition: Oriented to time, place and person.  Functional Limitations: Sitting, standing, walking, lying down increases pain.  Posture: slouched, decreased lumbar lordosis.  Gait: slow, wears a brace.  Palpation: Tenderness over L5-S1 bilaterally right more than left. L2 bilaterally, Left L4.  Pain: 1-6/10    ROM L/T SPINE C SPINE     Forward Bending NT NT     Backward Bending NT      Side Bending Left 25      Side Bending Right 25       Rotation Left NT      Rotation Right NT          Strength R L     Iliopsoas (L2) 4+ 4+     Quadriceps (L3) 5 5     Tibialis Anterior (L4) 4+ 5     EHL (L5) 5 5     Peroneus L & B (S1) 5 5     Gluts 5 5     Hamstrings (S1,2) 4+ 4+     Abdominals      Deferred      Back Extensors Deferred  Heel/Toe Walk NT        Special Tests:   Slump: + ve right.  Sciatic nerve: -ve  Femoral nerve: -ve   SLR: +ve 70 bilaterally.  FABER: -ve  LLD: No  Neural Tension Test: + ve right    Flexibility: Myofascial tightness of hamstrings, gastrocs, lumbar paraspinals.  Sensation and DTR: Patellar 2+, Achilles 2+ bilaterally.    Treatment Activities: HEP instructed, copies given. Ex: Ankle pumps, heel slides, KTC, seated neural mobs, seated active hip flexion, hip flexor stretch, standing side bends.  Educated the patient to role of physical therapy, plan of care, goals of therapy. Recommended pt to gradually increase activity. Walk twice a day.     Assessment:  28 year old female S/P L5-S1 TLIF 12/13/12 seen in PT for evaluation and treatment.    Oswestry: 40%    PT Diagnosis: Increased pain, myofascial tightness, decreased strength.  Goal potential/prognosis: Good.  Goals: Independent HEP and symptom management.  Patient Education: HEP  Patient Goal: Gain strength in back and both LEs, reduce tightness.  Treatment Given: Evaluation, HEP.  Plan:   Pt will be seen 2 x week for 6 more weeks for manual therapy, therapeutic ex, HEP, modalities as needed.  Pt will continue with HEP as instructed.     Erin Sons, PT, DPT      [place] [phone]

## 2013-04-04 ENCOUNTER — Ambulatory Visit
Admission: RE | Admit: 2013-04-04 | Discharge: 2013-04-04 | Disposition: A | Discharge status: Home / Self Care | Payer: Charity | Source: Ambulatory Visit

## 2013-04-04 NOTE — Progress Notes (Signed)
[facility]  [address]  [place], [place] [address]  [phone]    Daily Treatment Note    Patient:  [name] [name]     MRN#:  [id number]  Start of care:  04/01/2013          Medical Dx:L5-S1 TLIF, L-DDD, Lumbar Radicular pain  Therapy Dx:Increased pain, myofascial tightness, decreased strength  Referring MD: Irene Shipper. Hamilton, MD      Time Calculation  PT Received On: 04/04/13  Start Time: 0800  Stop Time: 0835  Time Calculation (min): 35 min      Therapeutic Ex      30 min         min         min      Visit number: 2  Change in Medications: No  Change in Medical Status: No    Precautions/Contraindications: S/P L5-S1 TLIF on 12/13/12    FALLS RISK: No    Subjective:   Was sore for two days after last visit. Did the exercises yesterday and feels OK today.    Pain:  Patient is appropriate for Physical Therapy intervention at this time.  Pt is agreeable to participation in the therapy session.     Objective:  Stretches: KTC, gentle LTR, hamstrings, gastrocs.  Strengthening:  TVA with bent knee raises x 5 ea.  TVA with SLR x 10 ea.  Bridges x 10  Clam shell x 5 ea  Hip abduction in side lying x 5 ea  Nu Step x 5 mins.Seat 8, level 0.    Treatment Activities:   Educated the patient to role of physical therapy, plan of care, goals of therapy.    Assessment:  Tolerated exercises well, no pain but was tired after exercises.    Plan:  Continue PT. Pt will continue HEP as instructed.    Erin Sons, PT, DPT      [place] [phone]

## 2013-04-07 ENCOUNTER — Ambulatory Visit
Admission: RE | Admit: 2013-04-07 | Discharge: 2013-04-07 | Disposition: A | Discharge status: Home / Self Care | Payer: Charity | Source: Ambulatory Visit

## 2013-04-07 NOTE — Progress Notes (Signed)
[facility]  [address]  [place], [place] [address]  [phone]    Daily Treatment Note    Patient:  [name] [name]     MRN#:  [id number]  Start of care:  04/01/2013          Medical Dx:L5-S1 TLIF, L-DDD, Lumbar Radicular pain  Therapy Dx:Increased pain, myofascial tightness, decreased strength  Referring MD: Irene Shipper. Hamilton, MD      Time Calculation  PT Received On: 04/07/13  Start Time: 0745  Stop Time: 0830  Time Calculation (min): 45 min      Therapeutic Ex      30 min         min         min      Visit number: 3  Change in Medications: No  Change in Medical Status: No    Precautions/Contraindications: S/P L5-S1 TLIF on 12/13/12    FALLS RISK: No    Subjective:   Feels OK. Upper back feels sore at times. Walked 2 miles yesterday.    Pain: 2-3/10  Patient is appropriate for Physical Therapy intervention at this time.  Pt is agreeable to participation in the therapy session.     Objective:  Stretches: KTC, gentle LTR, hamstrings, gastrocs.  Strengthening:  TVA with bent knee raises 2 x 7 ea.  TVA with SLR 2 x 7 ea.  Bridges x 10  Clam shell x 10 ea  Hip abduction in side lying x 10 ea  Nu Step x 5 mins.Seat 8, level 0.    Treatment Activities: Discussed posture and body mechanics, golfer's lift, gave her a handout on posture and body mechanics. Practiced hip hinge with a cane. Copies of exercises given.  Educated the patient to role of physical therapy, plan of care, goals of therapy.    Assessment:  Tolerated exercises well, no pain but was tired after exercises.  Tight hamstrings, left more than right.    Plan:  Continue PT. Pt will continue HEP as instructed.    Erin Sons, PT, DPT      [place] [phone]

## 2013-04-10 ENCOUNTER — Ambulatory Visit
Admission: RE | Admit: 2013-04-10 | Discharge: 2013-04-10 | Disposition: A | Discharge status: Home / Self Care | Payer: Charity | Source: Ambulatory Visit | Attending: Neurological Surgery | Admitting: Neurological Surgery

## 2013-04-10 DIAGNOSIS — M5137 Other intervertebral disc degeneration, lumbosacral region: Secondary | ICD-10-CM | POA: Insufficient documentation

## 2013-04-10 DIAGNOSIS — M545 Low back pain, unspecified: Secondary | ICD-10-CM | POA: Insufficient documentation

## 2013-04-10 DIAGNOSIS — M51379 Other intervertebral disc degeneration, lumbosacral region without mention of lumbar back pain or lower extremity pain: Secondary | ICD-10-CM | POA: Insufficient documentation

## 2013-04-10 NOTE — Progress Notes (Signed)
[facility]  [address]  [place], [place] [address]  [phone]    Daily Treatment Note    Patient:  [name] [name]     MRN#:  [id number]  Start of care:  04/01/2013          Medical Dx:L5-S1 TLIF, L-DDD, Lumbar Radicular pain  Therapy Dx:Increased pain, myofascial tightness, decreased strength  Referring MD: Irene Shipper. Hamilton, MD      Time Calculation  PT Received On: 04/10/13  Start Time: 0745  Stop Time: 0825  Time Calculation (min): 40 min      Therapeutic Ex      30 min         min         min      Visit number: 4  Change in Medications: No  Change in Medical Status: No    Precautions/Contraindications: S/P L5-S1 TLIF on 12/13/12    FALLS RISK: No    Subjective:   Feels sleepy today. Reports back pain in the evening, before going to bed. Trying to stay without back brace.    Pain: 2-3/10  Patient is appropriate for Physical Therapy intervention at this time.  Pt is agreeable to participation in the therapy session.     Objective:  Stretches: KTC, gentle LTR, hamstrings, gastrocs.  Strengthening:  TVA with bent knee raises 2 x 10 ea.  TVA with SLR 2 x 8 ea.  Bridges x 10  Clam shell 2 x 10 ea  Hip abduction in side lying 2 x 10 ea  Leg lifts and arm lifts in quadruped position x 5 ea.  Hip hinge with a cane x 5  Seated neural mobs x 2 sets of 10 ea.  Nu Step x 6 mins.Seat 8, level 0.    Treatment Activities:   Educated the patient to role of physical therapy, plan of care, goals of therapy.    Assessment:  Tolerated exercises well, no pain but was tired after exercises.  Was able to do new exercises with minimal difficulty.    Plan:  Continue PT. Pt will continue HEP as instructed.    Erin Sons, PT, DPT      [place] [phone]

## 2013-04-17 ENCOUNTER — Ambulatory Visit
Admission: RE | Admit: 2013-04-17 | Discharge: 2013-04-17 | Disposition: A | Discharge status: Home / Self Care | Payer: Charity | Source: Ambulatory Visit

## 2013-04-17 NOTE — Progress Notes (Signed)
[facility]  [address]  [place], [place] [address]  [phone]    Daily Treatment Note    Patient:  [name] [name]     MRN#:  [id number]  Start of care:  04/01/2013          Medical Dx:L5-S1 TLIF, L-DDD, Lumbar Radicular pain  Therapy Dx:Increased pain, myofascial tightness, decreased strength  Referring MD: Irene Shipper. Hamilton, MD      Time Calculation  PT Received On: 04/17/13  Start Time: 0800  Stop Time: 0840  Time Calculation (min): 40 min      Therapeutic Ex      30 min         min         min      Visit number: 5  Change in Medications: No  Change in Medical Status: No    Precautions/Contraindications: S/P L5-S1 TLIF on 12/13/12    FALLS RISK: No    Subjective:   Feels sleepy today. Went to [facility] for few days, exercised two times only in last few days. Reports that she did a lot of walking and going up and down the steps. Back was sore when going to [place], coming back was OK. Slept with heated blanket on all night and had burn/blisters on the back, almost gone now.    Pain: 2-3/10  Patient is appropriate for Physical Therapy intervention at this time.  Pt is agreeable to participation in the therapy session.     Objective:  Checked her back, pt has minimal redness over T12-L2 area bilaterally, did not see any blisters.  Stretches: KTC, gentle LTR, hamstrings, gastrocs.  Strengthening:  TVA with bent knee raises 2 x 10 ea.  TVA with SLR 2 x 8 ea.  Bridges x 10  Clam shell 2 x 10 ea  Hip abduction in side lying 2 x 10 ea  Leg lifts and arm lifts in quadruped position x 5 ea.  Hip hinge with a cane x 10, without cane x 10. Pt is able to do hip hinge properly without using the stick.    Nu Step x 10 mins.Seat 8, level 0.    Treatment Activities:   Educated the patient to role of physical therapy, plan of care, goals of therapy.    Assessment:  Tolerated exercises well, Minimal pain and was tired after exercises.  Was able to do new exercises with  minimal difficulty.    Plan:  Continue PT. Pt will continue HEP as instructed.    Erin Sons, PT, DPT      [place] [phone]

## 2013-04-22 ENCOUNTER — Ambulatory Visit
Admission: RE | Admit: 2013-04-22 | Discharge: 2013-04-22 | Disposition: A | Discharge status: Home / Self Care | Payer: Charity | Source: Ambulatory Visit

## 2013-04-22 NOTE — Progress Notes (Signed)
[facility] Out[facility]  [address]  [place], [place] [address]  [phone]    Daily Treatment Note    Patient:  [name] [name]     MRN#:  [id number]  Start of care:  04/01/2013          Medical Dx:L5-S1 TLIF, L-DDD, Lumbar Radicular pain  Therapy Dx:Increased pain, myofascial tightness, decreased strength  Referring MD: Irene Shipper. Hamilton, MD      Time Calculation  PT Received On: 04/22/13  Start Time: 0750  Stop Time: 0835  Time Calculation (min): 45 min      Therapeutic Ex      30 min         min         min      Visit number: 6  Change in Medications: No  Change in Medical Status: No    Precautions/Contraindications: S/P L5-S1 TLIF on 12/13/12    FALLS RISK: No    Subjective: Patient's husband came with her and was present during treatment.  C/O increased pain, did not sleep good last night. Did not do anything different to cause the pain.    Pain:5-6/10  Patient is appropriate for Physical Therapy intervention at this time.  Pt is agreeable to participation in the therapy session.     Objective:  Stretches: KTC, gentle LTR, hamstrings, gastrocs.  Strengthening:  TVA with bent knee raises  x 10 ea.  TVA with SLR x 10  Clam shell 2 x 10 ea  Hip abduction in side lying 2 x 10 ea  Leg lifts and arm lifts in quadruped position x 5 ea.  Hip hinge with a cane x 10, without cane x 5.  Pt is able to do hip hinge properly without using the stick.    Nu Step x 12 mins.Seat 8, level 0.    Treatment Activities: Tried to get on the floor to do the exercises in quadruped position, pt was able to get down to the floor, in quadruped position. She was able to get up from the floor with minimal difficulty but safely.  Educated the patient to role of physical therapy, plan of care, goals of therapy.    Assessment:  Was able to do new exercises with minimal difficulty. C/O increased pain.    Plan:  Continue PT. Pt will continue HEP as instructed.    Erin Sons, PT, DPT      [place] [phone]

## 2013-04-24 ENCOUNTER — Ambulatory Visit
Admission: RE | Admit: 2013-04-24 | Discharge: 2013-04-24 | Disposition: A | Discharge status: Home / Self Care | Payer: Charity | Source: Ambulatory Visit

## 2013-04-24 NOTE — Progress Notes (Signed)
[facility] Out[facility]  [address]  [place], [place] [address]  [phone]    Daily Treatment Note    Patient:  [name] [name]     MRN#:  [id number]  Start of care:  04/01/2013          Medical Dx:L5-S1 TLIF, L-DDD, Lumbar Radicular pain  Therapy Dx:Increased pain, myofascial tightness, decreased strength  Referring MD: Irene Shipper. Hamilton, MD      Time Calculation  PT Received On: 04/24/13  Start Time: 0750  Stop Time: 0840  Time Calculation (min): 50 min      Therapeutic Ex      30 min         min         min      Visit number: 7  Change in Medications: No  Change in Medical Status: No    Precautions/Contraindications: S/P L5-S1 TLIF on 12/13/12    FALLS RISK: No    Subjective: Patient's husband came with her and was present during treatment.  Doing better, walked two miles yesterday. Exercises are getting easier.    Pain:5-6/10  Patient is appropriate for Physical Therapy intervention at this time.  Pt is agreeable to participation in the therapy session.     Objective:  Stretches: KTC, gentle LTR, hamstrings, gastrocs.  Strengthening:  TVA with SLR 2 x 10  Bridges x 10  Clam shell 2 x 10 ea  Sahrmann's level I abdominal strengthening.  Hip abduction in side lying 2 x 10 ea  Arm and leg raises in quadruped position x 10 ea.  Scapular strengthening with theraband: used orange theraband.  With elbows flexed x 10  Elbows extended x 10  Lat pull downs x 10  Hip flexor stretch in standing.  Hip hinge with a cane x 10, without cane x 5.  Pt is able to do hip hinge properly without using the stick.    Nu Step x 14 mins.Seat 8, level 0.    Treatment Activities: Pt performed arm and leg raises in quadruped position on the floor, was able to get down and up from the floor independently. Copies of Sahrmann's ex, standing hip flexor stretch and hip hinge exercise given.  Educated the patient to role of physical therapy, plan of care, goals of therapy.    Assessment:  Tolerated all  exercises without any difficulty. Improved strength and endurance. Suggested pt to increase number of repetitions of exercises.    Plan:  Continue PT. Pt will continue HEP as instructed.    Erin Sons, PT, DPT      [place] [phone]

## 2013-04-29 ENCOUNTER — Ambulatory Visit
Admission: RE | Admit: 2013-04-29 | Discharge: 2013-04-29 | Disposition: A | Discharge status: Home / Self Care | Payer: Charity | Source: Ambulatory Visit

## 2013-04-29 NOTE — Progress Notes (Signed)
[facility] Out[facility]  [address]  [place], [place] [address]  [phone]    Daily Treatment Note    Patient:  [name] [name]     MRN#:  [id number]  Start of care:  04/01/2013          Medical Dx:L5-S1 TLIF, L-DDD, Lumbar Radicular pain  Therapy Dx:Increased pain, myofascial tightness, decreased strength  Referring MD: Irene Shipper. Hamilton, MD      Time Calculation  PT Received On: 04/29/13  Start Time: 0830  Stop Time: 0915  Time Calculation (min): 45 min      Therapeutic Ex      30 min         min         min      Visit number: 8  Change in Medications: No  Change in Medical Status: No    Precautions/Contraindications: S/P L5-S1 TLIF on 12/13/12    FALLS RISK: No    Subjective:   Came alone today. Feels good, pain is 2-3/10. Exercises are getting easier, wants to learn more challenging exercises.    Pain:2-3/10  Patient is appropriate for Physical Therapy intervention at this time.  Pt is agreeable to participation in the therapy session.     Objective:  Stretches: KTC, gentle LTR, hamstrings, gastrocs.  Strengthening:  TVA with SLR 2 x 10  Bridges 2 x 10  Clam shell 2 x 10 ea  Sahrmann's level I abdominal strengthening x 10 ea  Hip abduction in side lying 2 x 10 ea  Arm and leg raises in quadruped position 2 x 10 ea.  Scapular strengthening with theraband: used orange theraband.  With elbows flexed x 10  Elbows extended x 10  Lat pull downs x 10    Hip hinge without the cane x 10, hip hinge with a squat x 10  Added prone planks and side bridges, copies given. Pt was able to hold prone planks for 5-7 seconds, and was able to do 5 side bridges on each side. Right side bridge weaker than left.    Nu Step x 12 mins.Seat 8, level 0.    Treatment Activities: No pain with exercises. New exercises were challenging but was able to do it properly.  Educated the patient to role of physical therapy, plan of care, goals of therapy.    Assessment:  Tolerated all exercises without any  difficulty. Improved strength and endurance. Suggested pt to increase number of repetitions of exercises.    Plan:  Continue PT. Pt will continue HEP as instructed.    Erin Sons, PT, DPT      [place] [phone]

## 2013-05-01 ENCOUNTER — Ambulatory Visit
Admission: RE | Admit: 2013-05-01 | Discharge: 2013-05-01 | Disposition: A | Discharge status: Home / Self Care | Payer: Charity | Source: Ambulatory Visit

## 2013-05-01 NOTE — Progress Notes (Signed)
[facility] Out[facility] Therapy  [address]  [place], [place] [address]  [phone]    Daily Treatment Note    Patient:  [name] [name]     MRN#:  [id number]  Start of care:  04/01/2013          Medical Dx:L5-S1 TLIF, L-DDD, Lumbar Radicular pain  Therapy Dx:Increased pain, myofascial tightness, decreased strength  Referring MD: Irene Shipper. Hamilton, MD      Time Calculation  PT Received On: 05/01/13  Start Time: 0840  Stop Time: 0920  Time Calculation (min): 40 min      Therapeutic Ex      30 min         min         min      Visit number: 9  Change in Medications: No  Change in Medical Status: No    Precautions/Contraindications: S/P L5-S1 TLIF on 12/13/12    FALLS RISK: No    Subjective:   Reports increased pain after exercises last time. Pointed to both sides for pain. Reported that she was able to do the exercises and walk yesterday.    Pain:2-3/10  Patient is appropriate for Physical Therapy intervention at this time.  Pt is agreeable to participation in the therapy session.     Objective:  Stretches: KTC, gentle LTR, hamstrings, gastrocs.  MFR to quadratus bilaterally. Self release with tennis ball instructed. Suggested pt to stop the planks until she returns to PT. We will have her do the exercises here and tell her when to do them at home.  Strengthening:  TVA with SLR 2 x 10  Bridges 2 x 10  Clam shell 2 x 10 ea  Sahrmann's level I abdominal strengthening x 10 ea  Hip abduction in side lying 2 x 10 ea  Arm and leg raises in quadruped position 2 x 10 ea.  Scapular strengthening with theraband: used orange theraband.  With elbows flexed x 10  Elbows extended x 10  Lat pull downs x 10    Nu Step x 12 mins.Seat 8, level 0.    Treatment Activities:  Suggested pt to stop new exercises given last session.   Educated the patient to role of physical therapy, plan of care, goals of therapy.    Assessment:  Felt much better after Rx. Reported minimal pain.  Had tight quadratus after doing  planks, improved flexibility after Rx.    Plan:  Continue PT. Pt will continue HEP as instructed.    Erin Sons, PT, DPT      [place] [phone]

## 2013-05-06 ENCOUNTER — Ambulatory Visit: Payer: Charity

## 2013-05-08 ENCOUNTER — Ambulatory Visit: Payer: Charity

## 2013-05-13 ENCOUNTER — Ambulatory Visit
Admission: RE | Admit: 2013-05-13 | Discharge: 2013-05-13 | Disposition: A | Discharge status: Home / Self Care | Payer: Charity | Source: Ambulatory Visit | Attending: Neurological Surgery | Admitting: Neurological Surgery

## 2013-05-13 DIAGNOSIS — M545 Low back pain, unspecified: Secondary | ICD-10-CM | POA: Insufficient documentation

## 2013-05-13 DIAGNOSIS — M5137 Other intervertebral disc degeneration, lumbosacral region: Secondary | ICD-10-CM | POA: Insufficient documentation

## 2013-05-13 DIAGNOSIS — M51379 Other intervertebral disc degeneration, lumbosacral region without mention of lumbar back pain or lower extremity pain: Secondary | ICD-10-CM | POA: Insufficient documentation

## 2013-05-13 NOTE — Progress Notes (Addendum)
[facility] Out[facility] Therapy  [address]  [place], [place] [address]  [phone]    Daily Treatment Note    Patient:  [name] [name]     MRN#:  [id number]  Start of care:  04/01/2013          Medical Dx:L5-S1 TLIF, L-DDD, Lumbar Radicular pain  Therapy Dx:Increased pain, myofascial tightness, decreased strength  Referring MD: Irene Shipper. Hamilton, MD      Time Calculation  PT Received On: 05/13/13  Start Time: 0940  Stop Time: 1015  Time Calculation (min): 35 min      Therapeutic Ex      30 min         min         min      Visit number: 10  Change in Medications: No  Change in Medical Status: No    Precautions/Contraindications: S/P L5-S1 TLIF on 12/13/12    FALLS RISK: No    Subjective:   Reports increased pain, started on return flight from vacation. Reports pain in right LE radiates to mid calf posteriorly. Feels like she is back to starting point. Had to take pain medication to reduce pain. Moving out of town this weekend and would like to make Thursday's appointment her last one.  Pain:2-6/10  Patient is appropriate for Physical Therapy intervention at this time.  Pt is agreeable to participation in the therapy session.     Objective:  Stretches: KTC, gentle LTR, hamstrings, gastrocs.    Strengthening:  TVA with SLR 2 x 10  Bridges 2 x 10  Clam shell 2 x 10 ea  Sahrmann's level I abdominal strengthening x 10 ea  Hip abduction in side lying 2 x 10 ea    Nu Step x 12 mins.Seat 8, level 0.    Treatment Activities:  Suggested pt to stop new exercises given last session.   Educated the patient to role of physical therapy, plan of care, goals of therapy.    Assessment:  Felt much better after Rx. Reported minimal pain.  Had tight quadratus after doing planks, improved flexibility after Rx.    Plan:  Continue PT. Pt will continue HEP as instructed.    Erin Sons, PT, DPT      [place] [phone]

## 2013-05-15 ENCOUNTER — Ambulatory Visit
Admission: RE | Admit: 2013-05-15 | Discharge: 2013-05-15 | Disposition: A | Discharge status: Home / Self Care | Payer: Charity | Source: Ambulatory Visit

## 2013-05-15 NOTE — Discharge Summary (Signed)
[facility] Out[facility] Physical Therapy  [address]  [place], [place] [address]  [phone]    Daily Treatment Note    Patient:  [name] [name]     MRN#:  [id number]  Start of care:  04/01/2013          Medical Dx:L5-S1 TLIF, L-DDD, Lumbar Radicular pain  Therapy Dx:Increased pain, myofascial tightness, decreased strength  Referring MD: Irene Shipper. Hamilton, MD      Time Calculation  PT Received On: 05/15/13  Start Time: 0930  Stop Time: 1020  Time Calculation (min): 50 min      Therapeutic Ex      30 min         min         min      Visit number: 11  Change in Medications: No  Change in Medical Status: No    Precautions/Contraindications: S/P L5-S1 TLIF on 12/13/12    FALLS RISK: No    Subjective: Patient's husband came with her and was present throughout the treatment.  Continues to have some pain and tightness in thoracic and lumbar area. Has a cold, not feeling good because of it. Pt didn't want to do all her exercises, said she would like to do the stretches.    Pain:2-6/10  Patient is appropriate for Physical Therapy intervention at this time.  Pt is agreeable to participation in the therapy session.     Objective:  Stretches: KTC, gentle LTR, hamstrings, gastrocs, hip flexors, QL in standing.  Reviewed all her exercises, copies of all exercises given to her today and marked which exercises are stretches and which ones are strengthening.    Heat to back x 15 mins.    Treatment Activities:  Reviewed HEP.  Educated the patient to role of physical therapy, plan of care, goals of therapy.    Assessment:    Oswestry: 30% was 40% on 04/01/13.  Felt much better after Rx.   Minimal myofascial tightness.   Independent with HEP. Needed reassurance.    Plan:  D/C PT. Pt is moving to [facility]. Recommended her to start strengthening exercises when she settles down in her new place. Find a Physical therapist there and follow up with them. She will call or e-mail if she has any questions.    Erin Sons, PT, DPT      [place] [phone]

## 2013-05-19 ENCOUNTER — Ambulatory Visit: Payer: Charity

## 2013-06-03 ENCOUNTER — Other Ambulatory Visit (INDEPENDENT_AMBULATORY_CARE_PROVIDER_SITE_OTHER): Payer: Self-pay

## 2013-06-03 DIAGNOSIS — M5416 Radiculopathy, lumbar region: Secondary | ICD-10-CM

## 2013-06-03 DIAGNOSIS — M48061 Spinal stenosis, lumbar region without neurogenic claudication: Secondary | ICD-10-CM

## 2013-06-03 DIAGNOSIS — M5136 Other intervertebral disc degeneration, lumbar region: Secondary | ICD-10-CM

## 2013-06-04 ENCOUNTER — Ambulatory Visit (INDEPENDENT_AMBULATORY_CARE_PROVIDER_SITE_OTHER): Payer: Charity | Admitting: Neurological Surgery

## 2013-06-04 ENCOUNTER — Ambulatory Visit
Admission: RE | Admit: 2013-06-04 | Discharge: 2013-06-04 | Disposition: A | Discharge status: Home / Self Care | Payer: Charity | Source: Ambulatory Visit | Attending: Neurological Surgery | Admitting: Neurological Surgery

## 2013-06-04 VITALS — BP 110/75 | HR 101 | Temp 98.4°F | Resp 15 | Wt 159.2 lb

## 2013-06-04 DIAGNOSIS — M5137 Other intervertebral disc degeneration, lumbosacral region: Secondary | ICD-10-CM

## 2013-06-04 DIAGNOSIS — M5416 Radiculopathy, lumbar region: Secondary | ICD-10-CM

## 2013-06-04 DIAGNOSIS — M5440 Lumbago with sciatica, unspecified side: Secondary | ICD-10-CM

## 2013-06-04 DIAGNOSIS — M5430 Sciatica, unspecified side: Secondary | ICD-10-CM

## 2013-06-04 DIAGNOSIS — M51379 Other intervertebral disc degeneration, lumbosacral region without mention of lumbar back pain or lower extremity pain: Secondary | ICD-10-CM | POA: Insufficient documentation

## 2013-06-04 DIAGNOSIS — M48061 Spinal stenosis, lumbar region without neurogenic claudication: Secondary | ICD-10-CM | POA: Insufficient documentation

## 2013-06-04 DIAGNOSIS — M5136 Other intervertebral disc degeneration, lumbar region: Secondary | ICD-10-CM

## 2013-06-04 DIAGNOSIS — IMO0002 Reserved for concepts with insufficient information to code with codable children: Secondary | ICD-10-CM

## 2013-06-15 NOTE — Progress Notes (Signed)
Service Date: 06/04/2013     Patient Type: O     PHYSICIAN/PROVIDER: Marlaine Hind MD     HISTORY OF PRESENT ILLNESS:  [name] [name] presents for followup status post L5-S1 TLIF doing quite well.   She has just returned from a vacation to the [place].  She  states that she may have overdid it a little bit and has had some transient  leg and low back pain, but it appears to have resolved.  On today's visit,  her pain is 0/10 on the VAS pain scale.  She still has problems with  sitting for greater than an hour and with prolonged travel or plane flights  she still has some discomfort.  She has recently moved to [place] as  her husband plans to start his new residency.  She is taking no medications  except for acetaminophen over-the-counter periodically.     IMAGING STUDIES:  X-rays show good positioning of hardware and instrumentation.     PHYSICAL EXAMINATION:  VITAL SIGNS:  Blood pressure is 110/75, temperature 98.4, respirations 15,  pulse 101.  NEUROLOGIC:  She is awake, alert, oriented x3 in no acute distress.   Cranial nerves II-XII intact, 5/5 strength bilateral upper and lower  extremities.  Lower extremities, psoas, quadriceps, dorsiflexion, plantar  flexion assessed.  Sensation is intact to light touch throughout.  Straight  leg testing is negative.     IMPRESSION:  Doing well status post L5-S1 transforaminal lumbar interbody fusion.  The  patient will follow up with me at the 1-year anniversary of her surgery  with flexion, extension studies.  She is encouraged to resume all regular  activities and she was given a prescription for physical therapy.           JFH

## 2013-07-14 ENCOUNTER — Telehealth (INDEPENDENT_AMBULATORY_CARE_PROVIDER_SITE_OTHER): Payer: Self-pay

## 2013-07-14 NOTE — Telephone Encounter (Signed)
Telephone call from patient requesting for PT order to be faxed to [facility]. PT order faxed to [phone], per patient request.

## 2013-07-31 ENCOUNTER — Telehealth: Payer: Self-pay | Admitting: Gynecology

## 2013-07-31 NOTE — Telephone Encounter (Signed)
This is a new patient she is scheduled for 08/29/13. She told me that she has a breast lump and that it is time for her aex. After speaking with sally she told me i could scheduled her but to advise her seek treatment elsewhere between now and her scheduled appt for the breast lump. i told patient and she said she understood. I gave her some numbers to other gyn offices in the area.

## 2013-08-01 NOTE — Telephone Encounter (Signed)
Tried to contact patient to let her know about being seen Tuesday at 1:30. LMTCB

## 2013-08-01 NOTE — Telephone Encounter (Signed)
Patient scheduled.

## 2013-08-01 NOTE — Telephone Encounter (Signed)
i will see pt on Tuesday at 1:30, thanks

## 2013-08-05 ENCOUNTER — Encounter: Payer: Self-pay | Admitting: Gynecology

## 2013-08-05 ENCOUNTER — Ambulatory Visit (INDEPENDENT_AMBULATORY_CARE_PROVIDER_SITE_OTHER): Payer: 59 | Admitting: Gynecology

## 2013-08-05 ENCOUNTER — Other Ambulatory Visit: Payer: Self-pay | Admitting: Gynecology

## 2013-08-05 VITALS — BP 106/60 | Resp 18 | Ht 65.0 in | Wt 158.0 lb

## 2013-08-05 DIAGNOSIS — IMO0002 Reserved for concepts with insufficient information to code with codable children: Secondary | ICD-10-CM

## 2013-08-05 DIAGNOSIS — Z124 Encounter for screening for malignant neoplasm of cervix: Secondary | ICD-10-CM

## 2013-08-05 DIAGNOSIS — Z0000 Encounter for general adult medical examination without abnormal findings: Secondary | ICD-10-CM

## 2013-08-05 DIAGNOSIS — N644 Mastodynia: Secondary | ICD-10-CM

## 2013-08-05 DIAGNOSIS — Z01419 Encounter for gynecological examination (general) (routine) without abnormal findings: Secondary | ICD-10-CM

## 2013-08-05 DIAGNOSIS — Z13220 Encounter for screening for lipoid disorders: Secondary | ICD-10-CM

## 2013-08-05 DIAGNOSIS — E282 Polycystic ovarian syndrome: Secondary | ICD-10-CM

## 2013-08-05 LAB — POCT URINALYSIS DIPSTICK
Leukocytes, UA: NEGATIVE
Urobilinogen, UA: NEGATIVE
pH, UA: 7

## 2013-08-05 LAB — POCT URINE PREGNANCY: Preg Test, Ur: NEGATIVE

## 2013-08-05 NOTE — Progress Notes (Signed)
28 y.o. Married Caucasian female   [id number] here for annual exam. Pt is  currently sexually active.  She reports occassionally using condoms on a regular basis.  First sexual activity at 28 years old, 1 number of lifetime partners. Pt reports history of PCOS, dx by gyn after abdominal pain.  Pt reoprts at that time she was overweight and her cycles were irregular but now come every month, flow for 5-6d.  Pt was on metformin only for 2w. Pt is interested in conception but needs to wait until December due to recent surgery.  Pt has been married for 4y and has only recently using condoms.   Pt reports bilateral breast tenderness lateral, more on right.  Pt is right handed.  No recent infections.  In [place] for 74m.    Patient's last menstrual period was 07/18/2013.          Sexually active: Yes.    The current method of family planning is condoms sometimes.    Exercising: No.  Not at the moment Last pap: 2014? Alcohol: no Tobacco: no Drugs: no Gardisil: no, completed:   Health Maintenance  Topic Date Due  . Pap Smear  12/09/2004  . Tetanus/tdap  12/09/2005  . Influenza Vaccine  08/09/2013    Family History  Problem Relation Age of Onset  . Colon cancer Cousin   . Diabetes Paternal Grandmother   . Diabetes Paternal Grandfather   . Diabetes Maternal Grandmother   . Diabetes Maternal Grandfather   . Heart attack Father   . Hypertension Mother   . Hypertension Father   . Thyroid disease Mother     There are no active problems to display for this patient.   Past Medical History  Diagnosis Date  . Anxiety   . Depression   . PCOS (polycystic ovarian syndrome)     Past Surgical History  Procedure Laterality Date  . Lumbar fusion  12/2012  . Lumbar disc surgery  09/2007  . Lasik  2012    Allergies: Morphine  Current Outpatient Prescriptions  Medication Sig Dispense Refill  . gabapentin (NEURONTIN) 400 MG capsule Take by mouth.      Marland Kitchen acetaminophen (TYLENOL) 500 MG tablet Take  by mouth.      . ALPRAZolam (XANAX) 0.5 MG tablet Take by mouth as needed.      . cetirizine (ZYRTEC) 10 MG tablet Take by mouth daily.      . cyclobenzaprine (FLEXERIL) 5 MG tablet Take by mouth.      . fluticasone (FLONASE) 50 MCG/ACT nasal spray Place into the nose.      . ibuprofen (ADVIL,MOTRIN) 200 MG tablet Take by mouth as needed.      Marland Kitchen omeprazole (PRILOSEC) 10 MG capsule Take by mouth.       No current facility-administered medications for this visit.    ROS: Pertinent items are noted in HPI.  Exam:    BP 106/60  Resp 18  Ht 5\' 5"  (1.651 m)  Wt 158 lb (71.668 kg)  BMI 26.29 kg/m2  LMP 07/18/2013 Weight change: @WEIGHTCHANGE @ Last 3 height recordings:  Ht Readings from Last 3 Encounters:  08/05/13 5\' 5"  (1.651 m)   General appearance: alert, cooperative and appears stated age Head: Normocephalic, without obvious abnormality, atraumatic Neck: no adenopathy, no carotid bruit, no JVD, supple, symmetrical, trachea midline and thyroid not enlarged, symmetric, no tenderness/mass/nodules Lungs: clear to auscultation bilaterally Breasts: Inspection negative, No nipple retraction or dimpling, No nipple discharge or bleeding, No axillary or  supraclavicular adenopathy, generalized tenderness through-out without specific mass palpable Heart: regular rate and rhythm, S1, S2 normal, no murmur, click, rub or gallop Abdomen: soft, non-tender; bowel sounds normal; no masses,  no organomegaly Extremities: extremities normal, atraumatic, no cyanosis or edema Skin: Skin color, texture, turgor normal. No rashes or lesions Lymph nodes: Cervical, supraclavicular, and axillary nodes normal. no inguinal nodes palpated Neurologic: Grossly normal   Pelvic: External genitalia:  Multiple sebaceous cysts, mostly posterior vulva              Urethra: normal appearing urethra with no masses, tenderness or lesions              Bartholins and Skenes: Bartholin's, Urethra, Skene's normal                  Vagina: normal appearing vagina with normal color and discharge, no lesions              Cervix: normal appearance              Pap taken: Yes.          Bimanual Exam:  Uterus:  uterus is normal size, shape, consistency and nonspecific tenderness                                      Adnexa:    normal adnexa in size, nontender and no masses                                      Rectovaginal: Confirms, non-specific tenderness                                      Anus:  normal sphincter tone, no lesions          1. Routine gynecological examination  counseled on breast self exam, adequate intake of calcium and vitamin D return annually or prn  2. Laboratory examination ordered as part of a routine general medical examination  - POCT Urinalysis Dipstick - Hemoglobin, fingerstick -CMET-pt is fasting  3. Patient requested test Pt concerned she has never been pregnant despite - POCT urine pregnancy  4. PCOS (polycystic ovarian syndrome) Request records from [facility] and last gyn, ask pt to rto for consult after labs received and reviewed-agreeable Role of adiposity and menstrual abnormalities briefly reviewed  5. Breast tenderness Non-specific, generalized tenderness in entire exam,  Discussed decreasing caffeine, better supporting bras Vit E BID  6. Cholesterol screening -LP     An After Visit Summary was printed and given to the patient.

## 2013-08-06 LAB — COMPREHENSIVE METABOLIC PANEL
ALK PHOS: 56 U/L (ref 39–117)
ALT: 20 U/L (ref 0–35)
AST: 16 U/L (ref 0–37)
Albumin: 4.5 g/dL (ref 3.5–5.2)
BILIRUBIN TOTAL: 0.5 mg/dL (ref 0.2–1.2)
BUN: 10 mg/dL (ref 6–23)
CO2: 24 mEq/L (ref 19–32)
CREATININE: 0.66 mg/dL (ref 0.50–1.10)
Calcium: 9.8 mg/dL (ref 8.4–10.5)
Chloride: 103 mEq/L (ref 96–112)
Glucose, Bld: 99 mg/dL (ref 70–99)
Potassium: 4.3 mEq/L (ref 3.5–5.3)
Sodium: 138 mEq/L (ref 135–145)
Total Protein: 7.6 g/dL (ref 6.0–8.3)

## 2013-08-06 LAB — LIPID PANEL
CHOL/HDL RATIO: 4.7 ratio
Cholesterol: 210 mg/dL — ABNORMAL HIGH (ref 0–200)
HDL: 45 mg/dL (ref >39)
LDL CALC: 128 mg/dL — AB (ref 0–99)
Triglycerides: 183 mg/dL — ABNORMAL HIGH (ref <150)
VLDL: 37 mg/dL (ref 0–40)

## 2013-08-06 LAB — HEMOGLOBIN, FINGERSTICK: Hemoglobin, fingerstick: 12.7 g/dL (ref 12.0–16.0)

## 2013-08-06 NOTE — Progress Notes (Signed)
Service Date: 03/19/2013     Patient Type: O     PHYSICIAN/PROVIDER: Marlaine Hind MD     HISTORY OF PRESENT ILLNESS:  Ms. [name] [name] is here for followup status post L5-S1 TLIF, transforaminal  lumbar interbody fusion.  This was performed on December 16, 2012.  She is  doing well.  She has minimal pain, it has dramatically improved over  preoperative pain.  She is only taking Neurontin and Flexeril.  Her VAS  pain level is 1/10 on a VAS scale.  The radicular pain has subsided.   Mainly, she just has some tightness in her back periodically.     DIAGNOSTIC DATA:  X-rays today show good positioning of instrumentation at L5-S1.     PHYSICAL EXAMINATION:  Blood pressure 120/84, pulse is 102.  Cranial nerves II through XII were  intact.  She has got 5/5 strength deltoid, biceps, intrinsics and psoas,  quadriceps, dorsiflexion, plantar flexion.  Straight leg testing is  negative.  Sensation is intact to light touch and joint perception.  Wound  is well healed.     IMPRESSION:  Status post L5-S1 transforaminal lumbar interbody fusion performed December 13, 2012, doing well.     PLAN:  The patient will continue to increase her activities.  She will start  physical therapy and she will follow up with me in 3 months.        JFH

## 2013-08-07 LAB — INSULIN, FASTING: Insulin fasting, serum: 33 u[IU]/mL — ABNORMAL HIGH (ref 3–28)

## 2013-08-08 NOTE — Addendum Note (Signed)
Addended by: Elveria Rising on: 08/08/2013 01:36 PM   Modules accepted: Orders

## 2013-08-11 ENCOUNTER — Telehealth: Payer: Self-pay | Admitting: *Deleted

## 2013-08-11 NOTE — Telephone Encounter (Signed)
Message copied by Alfonzo Feller on Mon Aug 11, 2013 11:43 AM ------      Message from: Elveria Rising      Created: Mon Aug 11, 2013 11:20 AM       Inform fasting insulin is high although glucose is normal, pt had elevated fasting on hospital records, records from last gyn still pending but [place] reviewed ------

## 2013-08-11 NOTE — Telephone Encounter (Signed)
Patient notified (see result Note)

## 2013-08-11 NOTE — Telephone Encounter (Signed)
Left Message To Call Back  

## 2013-08-12 LAB — IPS PAP TEST WITH HPV

## 2013-08-29 ENCOUNTER — Encounter: Payer: Self-pay | Admitting: Gynecology

## 2013-09-08 ENCOUNTER — Ambulatory Visit: Payer: 59 | Admitting: Gynecology

## 2013-09-22 ENCOUNTER — Ambulatory Visit: Payer: 59 | Admitting: Gynecology

## 2013-09-30 ENCOUNTER — Ambulatory Visit: Payer: 59 | Admitting: Gynecology

## 2013-10-10 ENCOUNTER — Other Ambulatory Visit (INDEPENDENT_AMBULATORY_CARE_PROVIDER_SITE_OTHER): Payer: Self-pay

## 2013-10-10 DIAGNOSIS — M5136 Other intervertebral disc degeneration, lumbar region: Secondary | ICD-10-CM

## 2013-10-14 ENCOUNTER — Telehealth: Payer: Self-pay | Admitting: Gynecology

## 2013-10-14 NOTE — Telephone Encounter (Signed)
Pt had back surgery in [place] and has an order for aquatic therapy. She can not use that order in [place] and wants to know if Dr. Charlies Constable will give her an order for aquatic therapy instead?

## 2013-10-14 NOTE — Telephone Encounter (Signed)
Spoke with patient. Patient states that she had back surgery in [place]. Patient was performing PT in [place] before she moved to [place]. Patient states that her back has been bothering her more recently and she contacted her MD's office who performed the surgery and wrote the rx for the therapy. Patient would like to start aquatic therapy at this time and was approved by her MD in [place] who wrote an order for her. Patient spoke with [facility] outpatient and was told they needed an order from a MD licensed in [place]. Patient would like to know if Dr.Lathrop would be able to write this order for her. Patient will send current order from MD in [place] for Dr.Lathrop to take a look at.

## 2013-10-14 NOTE — Telephone Encounter (Signed)
Received order from patient from MD in [place]. Order to Dr.Lathrop for review and advise.

## 2013-10-15 ENCOUNTER — Ambulatory Visit: Payer: 59 | Admitting: Gynecology

## 2013-10-16 NOTE — Telephone Encounter (Signed)
Patient calling to check on status of call. Patient would like order if approved faxed to [phone] [facility] at [facility].

## 2013-10-17 NOTE — Telephone Encounter (Signed)
i think she would be best served with the rx coming from ortho or neurosurg depending upon who did the initial surgery so she can be followed appropriately after this point, we can provide her with a referral if she needs, just let us know what service she was under in [place]

## 2013-10-17 NOTE — Telephone Encounter (Signed)
Does her neurosurgeon in [place] want to speak with a NS here to help coordinate her care while she is in [place]? [facility] [phone] They may be able to offer her more services

## 2013-10-17 NOTE — Telephone Encounter (Signed)
Spoke with patient. Advised of message as seen below from Dr.Lathrop. Patient is agreeable and will have her doctor in [place] call to speak with someone at [facility].  Routing to provider for final review. Patient agreeable to disposition. Will close encounter

## 2013-10-17 NOTE — Telephone Encounter (Signed)
Spoke with patient. Advised of message as seen below from Dr.Lathrop. Patient states that she is continuing to follow up with neurosurg in [place]. "I still go to my appointments there for follow up since they know so much about me. I just need an order from a doctor in [place]." Advised patient would let Dr.Lathrop know but she really wants her to have appropriate follow up. Advised patient to check with MD in [place] to see if they have any recommendations as well. Advised will return call with any further recommendations from Dr.Lathrop. Patient is agreeable.

## 2013-10-20 ENCOUNTER — Ambulatory Visit (INDEPENDENT_AMBULATORY_CARE_PROVIDER_SITE_OTHER): Payer: 59 | Admitting: Gynecology

## 2013-10-20 VITALS — BP 108/68 | Resp 16 | Wt 158.0 lb

## 2013-10-20 DIAGNOSIS — E282 Polycystic ovarian syndrome: Secondary | ICD-10-CM

## 2013-10-20 NOTE — Progress Notes (Signed)
Pt here for consultation.  She is interested in attempting pregnancy after she is cleared by neurosurgery in 12/15.  Pt was diagnosed with PCOS when she lived in [place], but pt reports that she was about 50# heavier then. Pt was put on metformin but did not take it.  She brings in her menstrual calendar for 2105.  Her cycles seem regular at 29-31d. We discussed coital frequency, timing of ovulation, fecundity rates. Pt requested aquatic PT referral from her NS in [place], informed that referral should come from appropriate service and recommend contacting NS here in [place] to f/u. Due to history of PCOS will get FSH/LH, HbA1c Pt is day 5 of cycle otc PNV, womens' vitamin suggested Recommend discussing with NS red/c'g gabapentin if possible as is cat C in pregnancy, other meds ok, is not taking xanax Questions addressed. 54m spent counseling, >50% face to face

## 2013-10-21 LAB — FSH/LH
FSH: 8.2 m[IU]/mL
LH: 6.9 m[IU]/mL

## 2013-10-21 LAB — HEMOGLOBIN A1C
Hgb A1c MFr Bld: 6 % — ABNORMAL HIGH (ref <5.7)
Mean Plasma Glucose: 126 mg/dL — ABNORMAL HIGH (ref <117)

## 2013-10-23 ENCOUNTER — Telehealth: Payer: Self-pay | Admitting: Gynecology

## 2013-10-23 NOTE — Telephone Encounter (Signed)
Left message outside records are available to pick up, will hold until 10/30/13.

## 2013-10-26 ENCOUNTER — Encounter (HOSPITAL_COMMUNITY): Payer: Self-pay | Admitting: Emergency Medicine

## 2013-10-26 ENCOUNTER — Emergency Department (INDEPENDENT_AMBULATORY_CARE_PROVIDER_SITE_OTHER): Payer: 59

## 2013-10-26 ENCOUNTER — Emergency Department (HOSPITAL_COMMUNITY)
Admission: EM | Admit: 2013-10-26 | Discharge: 2013-10-26 | Disposition: A | Discharge status: Home / Self Care | Payer: 59 | Source: Home / Self Care | Attending: Family Medicine | Admitting: Family Medicine

## 2013-10-26 DIAGNOSIS — M7652 Patellar tendinitis, left knee: Secondary | ICD-10-CM

## 2013-10-26 MED ORDER — DICLOFENAC SODIUM 1 % TD GEL: 2.0000 g | Freq: Four times a day (QID) | TRANSDERMAL | Status: DC

## 2013-10-26 MED ORDER — METHYLPREDNISOLONE (PAK) 4 MG PO TABS: ORAL_TABLET | ORAL | Status: DC

## 2013-10-26 NOTE — ED Notes (Signed)
Pt  Reports  l  Knee  Pain     X  Several  Weeks       denys  Any  Recent  Injury   -  She  Reports  It  Feels  As  If  It may  Have  Some  Swelling to  The  Affected  Knee   She  Reports  Pain  Radiates  To  Back  Area   As well  History  Of   Lumbar  Fusion  In   Past            She  Is  Awake  And  Alert  And  Oriented

## 2013-10-26 NOTE — ED Provider Notes (Signed)
CSN: [id number]     Arrival date & time 10/26/13  1741 History   First MD Initiated Contact with Patient 10/26/13 1825     Chief Complaint  Patient presents with  . Knee Pain   (Consider location/radiation/quality/duration/timing/severity/associated sxs/prior Treatment) HPI Comments: Patient presents for evaluation of 2 weeks of left anterior knee pain. Reports that she has recently has lumbar disc surgery and is awaiting the opportunity to begin physical therapy. States her left anterior knee is most uncomfortable when she is walking up the stairs or up an incline. Denies known or specific injury or previous surgery. Denies swelling, fever or redness. Has tried using tylenol at home for pain management with only little improvement.   The history is provided by the patient.    Past Medical History  Diagnosis Date  . Anxiety   . Depression   . PCOS (polycystic ovarian syndrome)   . IBS (irritable bowel syndrome)    Past Surgical History  Procedure Laterality Date  . Lumbar fusion  12/2012    L5S1  . Lumbar disc surgery  09/2007    L5S1  . Lasik  2012   Family History  Problem Relation Age of Onset  . Colon cancer Cousin   . Diabetes Paternal Grandmother   . Diabetes Paternal Grandfather   . Diabetes Maternal Grandmother   . Diabetes Maternal Grandfather   . Heart attack Father   . Hypertension Mother   . Hypertension Father   . Thyroid disease Mother    History  Substance Use Topics  . Smoking status: Never Smoker   . Smokeless tobacco: Never Used  . Alcohol Use: No   OB History   Grav Para Term Preterm Abortions TAB SAB Ect Mult Living   0 0 0 0 0 0 0 0 0 0      Review of Systems  All other systems reviewed and are negative.   Allergies  Morphine  Home Medications   Prior to Admission medications   Medication Sig Start Date End Date Taking? Authorizing Provider  ALPRAZolam Duanne Moron) 0.5 MG tablet Take by mouth as needed.    Historical Provider, MD    cetirizine (ZYRTEC) 10 MG tablet Take by mouth daily.    Historical Provider, MD  cyclobenzaprine (FLEXERIL) 5 MG tablet Take by mouth.    Historical Provider, MD  diclofenac sodium (VOLTAREN) 1 % GEL Apply 2 g topically 4 (four) times daily. As needed for pain 10/26/13   Audelia Hives Thurley Francesconi, PA  fluticasone (FLONASE) 50 MCG/ACT nasal spray Place into the nose.    Historical Provider, MD  gabapentin (NEURONTIN) 400 MG capsule Take by mouth. 12/16/12   Historical Provider, MD  ibuprofen (ADVIL,MOTRIN) 200 MG tablet Take by mouth as needed.    Historical Provider, MD  methylPREDNIsolone (MEDROL DOSPACK) 4 MG tablet follow package directions 10/26/13   Lutricia Feil, PA  omeprazole (PRILOSEC) 10 MG capsule Take by mouth.    Historical Provider, MD   BP 109/74  Pulse 91  Temp(Src) 98.3 F (36.8 C) (Oral)  Resp 16  SpO2 100%  LMP 10/15/2013 Physical Exam  Nursing note and vitals reviewed. Constitutional: She is oriented to person, place, and time. She appears well-developed and well-nourished. No distress.  HENT:  Head: Normocephalic and atraumatic.  Eyes: Conjunctivae are normal. No scleral icterus.  Cardiovascular: Normal rate, regular rhythm and normal heart sounds.   Pulmonary/Chest: Effort normal and breath sounds normal.  Musculoskeletal: Normal range of motion.  Left knee: She exhibits normal range of motion, no swelling, no effusion, no ecchymosis, no deformity, no laceration, no erythema, normal alignment, no LCL laxity, normal patellar mobility, no bony tenderness, normal meniscus and no MCL laxity. Tenderness found.       Legs: Neurological: She is alert and oriented to person, place, and time.  Skin: Skin is warm and dry.  Psychiatric: She has a normal mood and affect. Her behavior is normal.    ED Course  Procedures (including critical care time) Labs Review Labs Reviewed - No data to display  Imaging Review Dg Knee Complete 4 Views Left  10/26/2013    CLINICAL DATA:  Pain in left knee for 2 weeks, no known injury, initial evaluation  EXAM: LEFT KNEE - COMPLETE 4+ VIEW  COMPARISON:  None.  FINDINGS: There is no evidence of fracture, dislocation, or joint effusion. There is no evidence of arthropathy or other focal bone abnormality. Soft tissues are unremarkable.  IMPRESSION: Negative.   Electronically Signed   By: Skipper Cliche M.D.   On: 10/26/2013 19:00     MDM   1. Patellar tendonitis of left knee   Films of left knee without acute abnormality. Exam consistent with patellar tendonitis. Will treat with topical Voltaren gel and medrol dose pack with follow up if no improvement.     [address], [place] 10/27/13 [phone]

## 2013-10-26 NOTE — Discharge Instructions (Signed)
Patellar Tendinitis, Jumper's Knee with Rehab Tendinitis is inflammation of a tendon. Tendonitis of the tendon below the kneecap (patella) is known as patellar tendonitis. Patellar tendonitis is a common cause of pain below the kneecap (infrapatellar). Patellar tendonitis may involve a tear (strain) in the ligament. Strains are classified into three categories. Grade 1 strains cause pain, but the tendon is not lengthened. Grade 2 strains include a lengthened ligament, due to the ligament being stretched or partially ruptured. With grade 2 strains there is still function, although function may be decreased. Grade 3 strains involve a complete tear of the tendon or muscle, and function is usually impaired. Patellar tendon strains are usually grade 1 or 2.  SYMPTOMS   Pain, tenderness, swelling, warmth, or redness over the patellar tendon (just below the kneecap).  Pain and loss of strength (sometimes), with forcefully straightening the knee (especially when jumping or rising from a seated or squatting position), or bending the knee completely (squatting or kneeling).  Crackling sound (crepitation) when the tendon is moved or touched. CAUSES  Patellar tendonitis is caused by injury to the patellar tendon. The inflammation is the body's healing response. Common causes of injury include:  Stress from a sudden increase in intensity, frequency, or duration of training.  Overuse of the thigh muscles (quadriceps) and patellar tendon.  Direct hit (trauma) to the knee or patellar tendon. RISK INCREASES WITH:  Sports that require sudden, explosive quadriceps contraction, such as jumping, quick starts, or kicking.  Running sports, especially running down hills.  Poor strength and flexibility of the thigh and knee.  Flat feet. PREVENTION  Warm up and stretch properly before activity.  Allow for adequate recovery between workouts.  Maintain physical fitness:  Strength, flexibility, and  endurance.  Cardiovascular fitness.  Protect the knee joint with taping, protective strapping, bracing, or elastic compression bandage.  Wear arch supports (orthotics). PROGNOSIS  If treated properly, patellar tendonitis usually heals within 6 weeks.  RELATED COMPLICATIONS   Longer healing time if not properly treated or if not given enough time to heal.  Recurring symptoms if activity is resumed too soon, with overuse, with a direct blow, or when using poor technique.  If untreated, tendon rupture requiring surgery. TREATMENT Treatment first involves the use of ice and medicine to reduce pain and inflammation. The use of strengthening and stretching exercises may help reduce pain with activity. These exercises may be performed at home or with a therapist. Serious cases of tendonitis may require restraining the knee for 10 to 14 days to prevent stress on the tendon and to promote healing. Crutches may be used (uncommon) until you can walk without a limp. For cases in which nonsurgical treatment is unsuccessful, surgery may be advised to remove the inflamed tendon lining (sheath). Surgery is rare, and is only advised after at least 6 months of nonsurgical treatment. MEDICATION   If pain medicine is needed, nonsteroidal anti-inflammatory medicines (aspirin and ibuprofen), or other minor pain relievers (acetaminophen), are often advised.  Do not take pain medicine for 7 days before surgery.  Prescription pain relievers may be given if your caregiver thinks they are needed. Use only as directed and only as much as you need. HEAT AND COLD  Cold treatment (icing) should be applied for 10 to 15 minutes every 2 to 3 hours for inflammation and pain, and immediately after activity that aggravates your symptoms. Use ice packs or an ice massage.  Heat treatment may be used before performing stretching and strengthening activities   prescribed by your caregiver, physical therapist, or athletic trainer.  Use a heat pack or a warm water soak. SEEK MEDICAL CARE IF:  Symptoms get worse or do not improve in 2 weeks, despite treatment.  New, unexplained symptoms develop. (Drugs used in treatment may produce side effects.) EXERCISES RANGE OF MOTION (ROM) AND STRETCHING EXERCISES - Patellar Tendinitis (Jumper's Knee) These are some of the initial exercises with which you may start your rehabilitation program, until you see your caregiver again or until your symptoms are resolved. Remember:   Flexible tissue is more tolerant of the stresses placed on it during activities.  Each stretch should be held for 20 to 30 seconds.  A gentle stretching sensation should be felt. STRETCH - Hamstrings, Supine  Lie on your back. Loop a belt or towel over the ball of your right / left foot.  Straighten your right / left knee and slowly pull on the belt to raise your leg. Do not allow the right / left knee to bend. Keep your opposite leg flat on the floor.  Raise the leg until you feel a gentle stretch behind your right / left knee or thigh. Hold this position for __________ seconds. Repeat __________ times. Complete this stretch __________ times per day.  STRETCH - Hamstrings, Doorway  Lie on your back with your right / left leg extended and resting on the wall, and the opposite leg flat on the ground through the door. At first, position your bottom farther away from the wall.  Keep your right / left knee straight. If you feel a stretch behind your knee or thigh, hold this position for __________ seconds.  If you do not feel a stretch, scoot your bottom closer to the door, and hold __________ seconds. Repeat __________ times. Complete this stretch __________ times per day.  STRETCH - Hamstrings, Standing  Stand or sit and extend your right / left leg, placing your foot on a chair or foot stool.  Keep a slight arch in your low back and your hips straight forward.  Lead with your chest and lean forward  at the waist until you feel a gentle stretch in the back of your right / left knee or thigh. (When done correctly, this exercise requires leaning only a small distance.)  Hold this position for __________ seconds. Repeat __________ times. Complete this stretch __________ times per day. STRETCH - Adductors, Lunge  While standing, spread your legs, with your right / left leg behind you.  Lean away from your right / left leg by bending your opposite knee. You may rest your hands on your thigh for balance.  You should feel a stretch in your right / left inner thigh. Hold for __________ seconds. Repeat __________ times. Complete this exercise __________ times per day.  STRENGTHENING EXERCISES - Patellar Tendinitis (Jumper's Knee) These exercises may help you when beginning to rehabilitate your injury. They may resolve your symptoms with or without further involvement from your physician, physical therapist or athletic trainer. While completing these exercises, remember:   Muscles can gain both the endurance and the strength needed for everyday activities through controlled exercises.  Complete these exercises as instructed by your physician, physical therapist or athletic trainer. Increase the resistance and repetitions only as guided by your caregiver. STRENGTH - Quadriceps, Isometrics  Lie on your back with your right / left leg extended and your opposite knee bent.  Gradually tense the muscles in the front of your right / left thigh. You should see   either your kneecap slide up toward your hip or increased dimpling just above the knee. This motion will push the back of the knee down toward the floor, mat, or bed on which you are lying.  Hold the muscle as tight as you can, without increasing your pain, for __________ seconds.  Relax the muscles slowly and completely in between each repetition. Repeat __________ times. Complete this exercise __________ times per day.  STRENGTH - Quadriceps,  Short Arcs  Lie on your back. Place a __________ inch towel roll under your right / left knee, so that the knee bends slightly.  Raise only your lower leg by tightening the muscles in the front of your thigh. Do not allow your thigh to rise.  Hold this position for __________ seconds. Repeat __________ times. Complete this exercise __________ times per day.  OPTIONAL ANKLE WEIGHTS: Begin with ____________________, but DO NOT exceed ____________________. Increase in 1 pound/ 0.5 kilogram increments. STRENGTH - Quadriceps, Straight Leg Raises  Quality counts! Watch for signs that the quadriceps muscle is working, to be sure you are strengthening the correct muscles and not "cheating" by substituting with healthier muscles.  Lay on your back with your right / left leg extended and your opposite knee bent.  Tense the muscles in the front of your right / left thigh. You should see either your kneecap slide up or increased dimpling just above the knee. Your thigh may even shake a bit.  Tighten these muscles even more and raise your leg 4 to 6 inches off the floor. Hold for __________ seconds.  Keeping these muscles tense, lower your leg.  Relax the muscles slowly and completely between each repetition. Repeat __________ times. Complete this exercise __________ times per day.  STRENGTH - Quadriceps, Squats  Stand in a door frame so that your feet and knees are in line with the frame.  Use your hands for balance, not support, on the frame.  Slowly lower your weight, bending at the hips and knees. Keep your lower legs upright so that they are parallel with the door frame. Squat only within the range that does not increase your knee pain. Never let your hips drop below your knees.  Slowly return upright, pushing with your legs, not pulling with your hands. Repeat __________ times. Complete this exercise __________ times per day.  STRENGTH - Quadriceps, Step-Downs  Stand on the edge of a step  stool or stair. Be prepared to use a countertop or wall for balance, if needed.  Keeping your right / left knee directly over the middle of your foot, slowly touch your opposite heel to the floor or lower step. Do not go all the way to the floor if your knee pain increases; just go as far as you can without increased discomfort. Use your right / left leg muscles, not gravity to lower your body weight.  Slowly push your body weight back up to the starting position. Repeat __________ times. Complete this exercise __________ times per day.  Document Released: 12/26/2004 Document Revised: 05/12/2013 Document Reviewed: 04/09/2008 [employer] Patient Information 2015 [employer]. This information is not intended to replace advice given to you by your health care provider. Make sure you discuss any questions you have with your health care provider.  

## 2013-10-28 ENCOUNTER — Other Ambulatory Visit: Payer: Self-pay | Admitting: Internal Medicine

## 2013-10-28 NOTE — Telephone Encounter (Signed)
Spoke with patient. Advised patient of results as seen below. Patient is agreeable and verbalizes understanding. Patient has made an appointment to establish primary care with Dr.Tower at [facility] on 11/17. Patient will follow up with HbA1c with their office.  Routing to provider for final review. Patient agreeable to disposition. Will close encounter

## 2013-10-28 NOTE — Telephone Encounter (Signed)
Message copied by Jasmine Awe on Tue Oct 28, 2013  3:25 PM ------      Message from: Elveria Rising      Created: Tue Oct 21, 2013 12:54 PM       Inform HbA1c is high, increased risk for DM, FSH/LH ratio is normal, does she have a PCP? ------

## 2013-10-29 NOTE — ED Provider Notes (Signed)
Medical screening examination/treatment/procedure(s) were performed by a resident physician or non-physician practitioner and as the supervising physician I was immediately available for consultation/collaboration.  Lynne Leader, MD    Gregor Hams, MD 10/29/13 [phone]

## 2013-10-30 ENCOUNTER — Ambulatory Visit: Payer: 59 | Admitting: Family Medicine

## 2013-11-03 ENCOUNTER — Ambulatory Visit: Payer: Self-pay | Admitting: Internal Medicine

## 2013-11-04 ENCOUNTER — Ambulatory Visit (INDEPENDENT_AMBULATORY_CARE_PROVIDER_SITE_OTHER): Payer: 59 | Admitting: Family Medicine

## 2013-11-04 VITALS — BP 111/78 | HR 114 | Ht 65.0 in | Wt 158.0 lb

## 2013-11-04 DIAGNOSIS — M25562 Pain in left knee: Secondary | ICD-10-CM

## 2013-11-04 DIAGNOSIS — Z981 Arthrodesis status: Secondary | ICD-10-CM

## 2013-11-04 NOTE — Patient Instructions (Signed)
You have Patellofemoral syndrome Avoid painful activities (especially squats and lunges, plyometrics, increasing running mileage) as much as possible. Start physical therapy and do home exercises on days you don't go to therapy. Wear shoes with good arch supports - consider something like dr. Zoe Lan active series or superfeet for other shoes that don't have good arch support. Icing 15 minutes at a time 3-4 times a day Tylenol or aleve as needed for pain Follow up with me in 5-6 weeks for reevaluation. We will also write for the therapy for your back at the same location.

## 2013-11-05 ENCOUNTER — Ambulatory Visit: Payer: 59 | Admitting: Sports Medicine

## 2013-11-05 ENCOUNTER — Encounter (INDEPENDENT_AMBULATORY_CARE_PROVIDER_SITE_OTHER): Payer: Self-pay | Admitting: Neurological Surgery

## 2013-11-06 ENCOUNTER — Telehealth: Payer: Self-pay | Admitting: Family Medicine

## 2013-11-06 ENCOUNTER — Encounter: Payer: Self-pay | Admitting: Family Medicine

## 2013-11-06 DIAGNOSIS — Z981 Arthrodesis status: Secondary | ICD-10-CM | POA: Insufficient documentation

## 2013-11-06 DIAGNOSIS — M25562 Pain in left knee: Secondary | ICD-10-CM | POA: Insufficient documentation

## 2013-11-06 NOTE — Assessment & Plan Note (Signed)
2/2 patellofemoral syndrome.  Start physical therapy and home exercises.  Icing, tylenol/nsaids if needed.  Arch supports to correct overpronation.  F/u in 5-6 weeks for reevaluation.

## 2013-11-06 NOTE — Progress Notes (Signed)
Patient ID: Sarah Castillo, female   DOB: [birth year], 28 y.o.   MRN: [id number]  PCP: No PCP Per Patient  Subjective:   HPI: Patient is a 28 y.o. female here for left knee pain.  Patient reports for about 1 month she has had pain anterior left knee. No known injury. + swelling. Gets popping and cracking with flexion. Knee feels more heavy than right knee. No bruising. No remote issues with this knee. No catching or locking.  She also sees a Publishing rights manager in [place] though she lives here - she has had lumbar fusion and this physician has written for her to do physical therapy with aquatic therapy but local PT places report they need referral from an MD with [place] medical license.  Past Medical History  Diagnosis Date  . Anxiety   . Depression   . PCOS (polycystic ovarian syndrome)   . IBS (irritable bowel syndrome)     Current Outpatient Prescriptions on File Prior to Visit  Medication Sig Dispense Refill  . cetirizine (ZYRTEC) 10 MG tablet Take by mouth daily.      . cyclobenzaprine (FLEXERIL) 5 MG tablet Take by mouth.      . gabapentin (NEURONTIN) 400 MG capsule Take by mouth.      Marland Kitchen omeprazole (PRILOSEC) 10 MG capsule Take by mouth.      . ALPRAZolam (XANAX) 0.5 MG tablet Take by mouth as needed.      . diclofenac sodium (VOLTAREN) 1 % GEL Apply 2 g topically 4 (four) times daily. As needed for pain  100 g  1  . fluticasone (FLONASE) 50 MCG/ACT nasal spray Place into the nose.      . ibuprofen (ADVIL,MOTRIN) 200 MG tablet Take by mouth as needed.      . methylPREDNIsolone (MEDROL DOSPACK) 4 MG tablet follow package directions  21 tablet  0   No current facility-administered medications on file prior to visit.    Past Surgical History  Procedure Laterality Date  . Lumbar fusion  12/2012    L5S1  . Lumbar disc surgery  09/2007    L5S1  . Lasik  2012    Allergies  Allergen Reactions  . Morphine Other (See Comments)    Other Reaction: FEVER, RASH    History    Social History  . Marital Status: Married    Spouse Name: N/A    Number of Children: N/A  . Years of Education: N/A   Occupational History  . Not on file.   Social History Main Topics  . Smoking status: Never Smoker   . Smokeless tobacco: Never Used  . Alcohol Use: No  . Drug Use: No  . Sexual Activity: Yes    Partners: Male   Other Topics Concern  . Not on file   Social History Narrative  . No narrative on file    Family History  Problem Relation Age of Onset  . Colon cancer Cousin   . Diabetes Paternal Grandmother   . Diabetes Paternal Grandfather   . Diabetes Maternal Grandmother   . Diabetes Maternal Grandfather   . Heart attack Father   . Hypertension Mother   . Hypertension Father   . Thyroid disease Mother     BP 111/78  Pulse 114  Ht 5\' 5"  (1.651 m)  Wt 158 lb (71.668 kg)  BMI 26.29 kg/m2  LMP 10/15/2013  Review of Systems: See HPI above.    Objective:  Physical Exam:  Gen: NAD  Left knee: No  gross deformity, ecchymoses, swelling.  VMO atrophy. Mild TTP around patella.  No joint line tenderness. FROM. Hip abduction strength 4+/5. Negative ant/post drawers. Negative valgus/varus testing. Negative lachmanns. Negative mcmurrays, apleys, patellar apprehension. Overpronation. NV intact distally.    Assessment & Plan:  1. Left knee pain - 2/2 patellofemoral syndrome.  Start physical therapy and home exercises.  Icing, tylenol/nsaids if needed.  Arch supports to correct overpronation.  F/u in 5-6 weeks for reevaluation.  2. S/p lumbar fusion - advised patient I would be happy to rewrite her therapy copying instructions from her neurosurgeon if that is what is required for her to get the appropriate therapy for her back.

## 2013-11-07 NOTE — Telephone Encounter (Signed)
Spoke to her yesterday afternoon.

## 2013-11-07 NOTE — Addendum Note (Signed)
Addended by: Sherrie George F on: 11/07/2013 08:53 AM   Modules accepted: Orders

## 2013-11-11 ENCOUNTER — Encounter: Payer: Self-pay | Admitting: Family Medicine

## 2013-11-25 ENCOUNTER — Encounter: Payer: Self-pay | Admitting: Family Medicine

## 2013-11-25 ENCOUNTER — Ambulatory Visit (INDEPENDENT_AMBULATORY_CARE_PROVIDER_SITE_OTHER): Payer: 59 | Admitting: Family Medicine

## 2013-11-25 VITALS — BP 124/74 | HR 103 | Temp 98.9°F | Ht 64.5 in | Wt 160.8 lb

## 2013-11-25 DIAGNOSIS — K589 Irritable bowel syndrome without diarrhea: Secondary | ICD-10-CM | POA: Insufficient documentation

## 2013-11-25 DIAGNOSIS — J3089 Other allergic rhinitis: Secondary | ICD-10-CM

## 2013-11-25 DIAGNOSIS — E282 Polycystic ovarian syndrome: Secondary | ICD-10-CM

## 2013-11-25 DIAGNOSIS — M25562 Pain in left knee: Secondary | ICD-10-CM

## 2013-11-25 DIAGNOSIS — K219 Gastro-esophageal reflux disease without esophagitis: Secondary | ICD-10-CM

## 2013-11-25 DIAGNOSIS — J309 Allergic rhinitis, unspecified: Secondary | ICD-10-CM | POA: Insufficient documentation

## 2013-11-25 DIAGNOSIS — Z23 Encounter for immunization: Secondary | ICD-10-CM

## 2013-11-25 DIAGNOSIS — F4323 Adjustment disorder with mixed anxiety and depressed mood: Secondary | ICD-10-CM

## 2013-11-25 MED ORDER — ALPRAZOLAM 0.5 MG PO TABS: 0.5000 mg | ORAL_TABLET | Freq: Two times a day (BID) | ORAL | Status: DC | PRN

## 2013-11-25 MED ORDER — FLUTICASONE PROPIONATE 50 MCG/ACT NA SUSP: 2.0000 | Freq: Every day | NASAL | Status: DC | PRN

## 2013-11-25 NOTE — Progress Notes (Signed)
Pre visit review using our clinic review tool, if applicable. No additional management support is needed unless otherwise documented below in the visit note. 

## 2013-11-25 NOTE — Progress Notes (Signed)
Subjective:    Patient ID: Sarah Castillo, female    DOB: 12-09-1985, 28 y.o.   MRN: [id number]  HPI Here to get established   Husband is a first year resident at [employer] - will finish that and then [employer]   She is not working currently - wants to go back to school -perhaps for nursing  May wants to start a family   Needs Tdap  Getting over a cold /no fever (taking mucinex DM) - that is helping  Wants a flu shot if she can get it  Very thick phlegm   Has had a discectomy  And a lumbar fusion  Still has issues with pain , also weak legs -  Stays sore  Just started physical therapy  She saw sport med at [facility]  Is improving slowly  Has 1 year f/u with neurosurg in [place] in December   Has hx of depression / anxiety  This worsened after her surgery and after moving    (she had to go to ER once for heart palpitations and panic symptoms)  She uses xanax infrequently - she tries not to take it  She sees a therapist currently -that is helping  She just started using voltaren gel for knee pain - patellofemoral syndrome - was also given short course of prednisone   Goes to Obgyn  Not on any contraception  Pap smear was in 7/15  Had pcos in the past - periods have been regular  Last A1c 6.0  She is watching diet for sugar -did not put her on metformin (gave her GI side eff in the past)  She did loose weight -and that helped   Has allergies -=needs refill of flonase  claritin   She takes omeprazole  Has not had H pylori test  She is from [place]  Had EGD in 2010 - was hosp at that time for colitis (had infectious colitis after being out of the country) Also dx with IBS   Patient Active Problem List   Diagnosis Date Noted  . Left knee pain 11/06/2013  . S/P lumbar fusion 11/06/2013   Past Medical History  Diagnosis Date  . Anxiety   . Depression   . PCOS (polycystic ovarian syndrome)   . IBS (irritable bowel syndrome)    Past Surgical History  Procedure  Laterality Date  . Lumbar fusion  12/2012    L5S1  . Lumbar disc surgery  09/2007    L5S1  . Lasik  2012   History  Substance Use Topics  . Smoking status: Never Smoker   . Smokeless tobacco: Never Used  . Alcohol Use: No   Family History  Problem Relation Age of Onset  . Colon cancer Cousin   . Diabetes Paternal Grandmother   . Diabetes Paternal Grandfather   . Diabetes Maternal Grandmother   . Diabetes Maternal Grandfather   . Heart attack Father   . Hypertension Mother   . Hypertension Father   . Thyroid disease Mother   . Hyperlipidemia Mother   . Hyperlipidemia Father    Allergies  Allergen Reactions  . Morphine Other (See Comments)    Other Reaction: FEVER, RASH   Current Outpatient Prescriptions on File Prior to Visit  Medication Sig Dispense Refill  . ALPRAZolam (XANAX) 0.5 MG tablet Take 0.5 mg by mouth as needed.     . diclofenac sodium (VOLTAREN) 1 % GEL Apply 2 g topically 4 (four) times daily. As needed for pain 100  g 1  . fluticasone (FLONASE) 50 MCG/ACT nasal spray Place 2 sprays into both nostrils daily as needed.     Marland Kitchen omeprazole (PRILOSEC) 10 MG capsule Take 10 mg by mouth daily before breakfast.      No current facility-administered medications on file prior to visit.      Review of Systems    Review of Systems  Constitutional: Negative for fever, appetite change, fatigue and unexpected weight change.  Eyes: Negative for pain and visual disturbance.  Respiratory: Negative for cough and shortness of breath.   Cardiovascular: Negative for cp or palpitations    Gastrointestinal: Negative for nausea, and constipation. pos for intermittent diarrhea from IBS Genitourinary: Negative for urgency and frequency.  Skin: Negative for pallor or rash   MSK pos for chronic back pain and recent knee pain worse on the L  Neurological: Negative for weakness, light-headedness, numbness and headaches.  Hematological: Negative for adenopathy. Does not bruise/bleed  easily.  Psychiatric/Behavioral: pos for intermittent dysphoric mood and anxiety that is improving .      Objective:   Physical Exam  Constitutional: She appears well-developed and well-nourished. No distress.  overwt and well appearing   HENT:  Head: Normocephalic and atraumatic.  Right Ear: External ear normal.  Left Ear: External ear normal.  Nose: Nose normal.  Mouth/Throat: Oropharynx is clear and moist.  Nares are boggy but clear   Eyes: Conjunctivae and EOM are normal. Pupils are equal, round, and reactive to light. Right eye exhibits no discharge. Left eye exhibits no discharge. No scleral icterus.  Neck: Normal range of motion. Neck supple. No JVD present. No thyromegaly present.  Cardiovascular: Normal rate, regular rhythm, normal heart sounds and intact distal pulses.  Exam reveals no gallop.   Pulmonary/Chest: Effort normal and breath sounds normal. No respiratory distress. She has no wheezes. She has no rales.  Abdominal: Soft. Bowel sounds are normal. She exhibits no distension and no mass. There is no tenderness.  No suprapubic tenderness or fullness    Musculoskeletal: She exhibits tenderness. She exhibits no edema.  L knee- tender patellar tendon  Nl rom both knees without crepitus or bony tenderness  Lymphadenopathy:    She has no cervical adenopathy.  Neurological: She is alert. She has normal reflexes. No cranial nerve deficit. She exhibits normal muscle tone. Coordination normal.  Skin: Skin is warm and dry. No rash noted. No erythema. No pallor.  Psychiatric: She has a normal mood and affect.          Assessment & Plan:   Problem List Items Addressed This Visit      Respiratory   Allergic rhinitis    Seasonal  Uses flonase and claritin prn  Stressed allergen avoidance when possible       Digestive   GERD (gastroesophageal reflux disease)    No symptoms while on PPI and watching diet Will continue omeprazole      IBS (irritable bowel syndrome)      Suggest use of fiber daily and imp of hydration as well as high fiber diet       Endocrine   PCOS (polycystic ovarian syndrome)    Per pt improved with wt loss  Intol of metformin Menses are regular now       Other   Adjustment disorder with mixed anxiety and depressed mood - Primary    Enc to continue therapy  Is getting better as her back continues to heal (this was worsened by recent fusion surgery)  Uses xanax prn/sparingly  Disc controlled subst contract and need for caution of sedation and habit    Left knee pain    Patellofemoral syndrome -worse in L knee Disc use of brace or patellar band/ bike and ice when needed  Desires sport med visit-ref to Dr Lorelei Pont      Other Visit Diagnoses    Need for Tdap vaccination        Relevant Orders       Tdap vaccine greater than or equal to 7yo IM (Completed)    Need for prophylactic vaccination and inoculation against influenza        Relevant Orders       Flu Vaccine QUAD 36+ mos PF IM (Fluarix Quad PF) (Completed)

## 2013-11-25 NOTE — Patient Instructions (Addendum)
Flu vaccine and Tdap vaccine today  Stop at check out for referral to Dr Lorelei Pont for knee pain  Will refill xanax and flonase  Take care of yourself  Continue low sugar diet  Do start prenatal vitamins daily when you decide to try to get pregnant

## 2013-11-27 NOTE — Assessment & Plan Note (Signed)
Seasonal  Uses flonase and claritin prn  Stressed allergen avoidance when possible

## 2013-11-27 NOTE — Assessment & Plan Note (Signed)
Patellofemoral syndrome -worse in L knee Disc use of brace or patellar band/ bike and ice when needed  Desires sport med visit-ref to Dr Lorelei Pont

## 2013-11-27 NOTE — Assessment & Plan Note (Signed)
Suggest use of fiber daily and imp of hydration as well as high fiber diet

## 2013-11-27 NOTE — Assessment & Plan Note (Signed)
Enc to continue therapy  Is getting better as her back continues to heal (this was worsened by recent fusion surgery)  Uses xanax prn/sparingly  Disc controlled subst contract and need for caution of sedation and habit

## 2013-11-27 NOTE — Assessment & Plan Note (Signed)
Per pt improved with wt loss  Intol of metformin Menses are regular now

## 2013-11-27 NOTE — Assessment & Plan Note (Signed)
No symptoms while on PPI and watching diet Will continue omeprazole

## 2013-12-09 ENCOUNTER — Encounter: Payer: Self-pay | Admitting: Family Medicine

## 2013-12-10 ENCOUNTER — Telehealth: Payer: Self-pay | Admitting: Gynecology

## 2013-12-10 NOTE — Telephone Encounter (Signed)
Left message regarding cancelled aex appointment.

## 2013-12-16 ENCOUNTER — Ambulatory Visit: Payer: 59 | Admitting: Family Medicine

## 2013-12-17 ENCOUNTER — Ambulatory Visit: Payer: 59 | Admitting: Family Medicine

## 2013-12-22 ENCOUNTER — Ambulatory Visit: Payer: 59 | Admitting: Family Medicine

## 2013-12-22 ENCOUNTER — Emergency Department: Payer: Self-pay | Admitting: Emergency Medicine

## 2013-12-22 LAB — CBC WITH DIFFERENTIAL/PLATELET
BASOS PCT: 0.7 %
Basophil #: 0.1 10*3/uL (ref 0.0–0.1)
EOS ABS: 0.4 10*3/uL (ref 0.0–0.7)
Eosinophil %: 4.3 %
HCT: 35.8 % (ref 35.0–47.0)
HGB: 11.8 g/dL — ABNORMAL LOW (ref 12.0–16.0)
LYMPHS ABS: 3.5 10*3/uL (ref 1.0–3.6)
Lymphocyte %: 37.8 %
MCH: 24.8 pg — ABNORMAL LOW (ref 26.0–34.0)
MCHC: 33 g/dL (ref 32.0–36.0)
MCV: 75 fL — AB (ref 80–100)
MONO ABS: 0.5 x10 3/mm (ref 0.2–0.9)
Monocyte %: 5.5 %
NEUTROS ABS: 4.7 10*3/uL (ref 1.4–6.5)
NEUTROS PCT: 51.7 %
PLATELETS: 285 10*3/uL (ref 150–440)
RBC: 4.77 10*6/uL (ref 3.80–5.20)
RDW: 15.6 % — ABNORMAL HIGH (ref 11.5–14.5)
WBC: 9.2 10*3/uL (ref 3.6–11.0)

## 2013-12-22 LAB — TROPONIN I
Troponin-I: <0.02 ng/mL
Troponin-I: <0.02 ng/mL

## 2013-12-22 LAB — BASIC METABOLIC PANEL
Anion Gap: 10 (ref 7–16)
BUN: 9 mg/dL (ref 7–18)
CHLORIDE: 105 mmol/L (ref 98–107)
Calcium, Total: 8.7 mg/dL (ref 8.5–10.1)
Co2: 23 mmol/L (ref 21–32)
Creatinine: 0.85 mg/dL (ref 0.60–1.30)
EGFR (Non-African Amer.): >60
Glucose: 111 mg/dL — ABNORMAL HIGH (ref 65–99)
Osmolality: 275 (ref 275–301)
Potassium: 3.5 mmol/L (ref 3.5–5.1)
Sodium: 138 mmol/L (ref 136–145)

## 2013-12-22 LAB — URINALYSIS, COMPLETE
BACTERIA: NONE SEEN
BILIRUBIN, UR: NEGATIVE
Glucose,UR: NEGATIVE mg/dL (ref 0–75)
KETONE: NEGATIVE
LEUKOCYTE ESTERASE: NEGATIVE
Nitrite: NEGATIVE
PH: 7 (ref 4.5–8.0)
Protein: NEGATIVE
RBC,UR: 20 /HPF (ref 0–5)
Specific Gravity: 1.002 (ref 1.003–1.030)
Squamous Epithelial: 1
WBC UR: <1 /HPF (ref 0–5)

## 2013-12-22 LAB — MAGNESIUM: Magnesium: 1.9 mg/dL

## 2013-12-23 ENCOUNTER — Encounter (INDEPENDENT_AMBULATORY_CARE_PROVIDER_SITE_OTHER): Payer: Self-pay | Admitting: Neurological Surgery

## 2013-12-24 ENCOUNTER — Ambulatory Visit (INDEPENDENT_AMBULATORY_CARE_PROVIDER_SITE_OTHER): Payer: 59 | Admitting: Family Medicine

## 2013-12-24 ENCOUNTER — Encounter: Payer: Self-pay | Admitting: Family Medicine

## 2013-12-24 VITALS — BP 106/68 | HR 100 | Temp 98.4°F | Ht 64.5 in | Wt 163.8 lb

## 2013-12-24 DIAGNOSIS — R000 Tachycardia, unspecified: Secondary | ICD-10-CM | POA: Insufficient documentation

## 2013-12-24 DIAGNOSIS — M791 Myalgia: Secondary | ICD-10-CM

## 2013-12-24 DIAGNOSIS — M7918 Myalgia, other site: Secondary | ICD-10-CM

## 2013-12-24 DIAGNOSIS — F4323 Adjustment disorder with mixed anxiety and depressed mood: Secondary | ICD-10-CM

## 2013-12-24 MED ORDER — SERTRALINE HCL 50 MG PO TABS: 50.0000 mg | ORAL_TABLET | Freq: Every day | ORAL | Status: DC

## 2013-12-24 NOTE — Patient Instructions (Signed)
Start sertraline (zoloft) 1/2 pill daily for 2 weeks  If you get worse anxiety or depression stop it and let me know  After 2 weeks -increase to one pill daily  Continue counseling - if you want to see someone here let me know   Take care of yourself  Follow up with me in 3-4 weeks

## 2013-12-24 NOTE — Progress Notes (Signed)
Pre visit review using our clinic review tool, if applicable. No additional management support is needed unless otherwise documented below in the visit note. 

## 2013-12-24 NOTE — Progress Notes (Signed)
Subjective:    Patient ID: Sarah Castillo, female    DOB: 04/01/1985, 28 y.o.   MRN: [id number]  HPI  Here for ER follow up   Went to ER on Sunday night  Thinks she had a panic attack   (even though she had a normal day)  Woke up with palpitations -and they got worse and worse as the day went on Followed by a headache  Felt weak all over  And got dizzy  Almost fell going to the bathroom    Both HR and bp were high  husb said pulse was 110-120  Took her xanax trying to calm down   Went to ER - EKG was nl and CT of head ok - and did labs (mag slt low at 1.9)- did replace that  Thought symptoms were from anxiety    Palpitations have calmed down  Still a little dizzy  Headache persists - had to take excedrin  Feels like she has pressure on her head and scalp is sensitive  Neck hurts also   Wonders if she would benefit from SSRI  She is irritable and anxious lately  Also has depression at times   She has seen a counselor - has one in [place]   Has all over body pain  Tight all over  Joints and muscles     Hx of panic attacks since her back surg Uses xanax as needed   Patient Active Problem List   Diagnosis Date Noted  . PCOS (polycystic ovarian syndrome) 11/25/2013  . Adjustment disorder with mixed anxiety and depressed mood 11/25/2013  . Allergic rhinitis 11/25/2013  . GERD (gastroesophageal reflux disease) 11/25/2013  . IBS (irritable bowel syndrome) 11/25/2013  . Left knee pain 11/06/2013  . S/P lumbar fusion 11/06/2013   Past Medical History  Diagnosis Date  . Anxiety   . Depression   . PCOS (polycystic ovarian syndrome)   . IBS (irritable bowel syndrome)    Past Surgical History  Procedure Laterality Date  . Lumbar fusion  12/2012    L5S1  . Lumbar disc surgery  09/2007    L5S1  . Lasik  2012  . Colonoscopy w/ biopsies  01/19/09    normal  . Esophagogastroduodenoscopy  08/03/08    mild gastritis and HH   History  Substance Use Topics  .  Smoking status: Never Smoker   . Smokeless tobacco: Never Used  . Alcohol Use: No   Family History  Problem Relation Age of Onset  . Colon cancer Cousin   . Diabetes Paternal Grandmother   . Diabetes Paternal Grandfather   . Diabetes Maternal Grandmother   . Diabetes Maternal Grandfather   . Heart attack Father   . Hypertension Mother   . Hypertension Father   . Thyroid disease Mother   . Hyperlipidemia Mother   . Hyperlipidemia Father    Allergies  Allergen Reactions  . Metformin And Related Diarrhea    GI side eff   . Morphine Other (See Comments)    Other Reaction: FEVER, RASH   Current Outpatient Prescriptions on File Prior to Visit  Medication Sig Dispense Refill  . acetaminophen (TYLENOL) 500 MG tablet Take 1,000 mg by mouth daily.    Marland Kitchen ALPRAZolam (XANAX) 0.5 MG tablet Take 1 tablet (0.5 mg total) by mouth 2 (two) times daily as needed. 30 tablet 1  . Biotin 5000 MCG CAPS Take 1 capsule by mouth daily.    . cyclobenzaprine (FLEXERIL) 10 MG tablet  Take 10 mg by mouth at bedtime.    . diclofenac sodium (VOLTAREN) 1 % GEL Apply 2 g topically 4 (four) times daily. As needed for pain 100 g 1  . fluticasone (FLONASE) 50 MCG/ACT nasal spray Place 2 sprays into both nostrils daily as needed. 16 g 3  . gabapentin (NEURONTIN) 300 MG capsule Take 300 mg by mouth at bedtime.    Marland Kitchen loratadine (CLARITIN) 10 MG tablet Take 5 mg by mouth at bedtime.    Marland Kitchen omeprazole (PRILOSEC) 10 MG capsule Take 10 mg by mouth daily before breakfast.      No current facility-administered medications on file prior to visit.      Review of Systems Review of Systems  Constitutional: Negative for fever, appetite change,  and unexpected weight change.  Eyes: Negative for pain and visual disturbance.  Respiratory: Negative for cough and shortness of breath.   Cardiovascular: Negative for cp and pos for rapid HR/ neg for PND or orthopnea  Gastrointestinal: Negative for nausea, diarrhea and constipation.    Genitourinary: Negative for urgency and frequency.  Skin: Negative for pallor or rash   MSK pos for all over body pain and chronic back pain  Neurological: Negative for weakness, light-headedness, numbness and headaches.  Hematological: Negative for adenopathy. Does not bruise/bleed easily.  Psychiatric/Behavioral: pos for dysphoric mood and anxiety , neg for SI.         Objective:   Physical Exam  Constitutional: She appears well-developed and well-nourished. No distress.  HENT:  Head: Normocephalic and atraumatic.  Mouth/Throat: Oropharynx is clear and moist.  Eyes: Conjunctivae and EOM are normal. Pupils are equal, round, and reactive to light. No scleral icterus.  Neck: Normal range of motion. Neck supple. No JVD present. Carotid bruit is not present. No thyromegaly present.  Cardiovascular: Regular rhythm and normal heart sounds.   Pulmonary/Chest: Effort normal and breath sounds normal. No respiratory distress. She has no wheezes. She has no rales.  Abdominal: Soft. Bowel sounds are normal. She exhibits no distension. There is no tenderness.  Musculoskeletal: She exhibits tenderness. She exhibits no edema.  Diffuse myofasical tenderness  Limited rom of back   Lymphadenopathy:    She has no cervical adenopathy.  Neurological: She is alert. She has normal reflexes. No cranial nerve deficit. She exhibits normal muscle tone. Coordination normal.  Skin: Skin is warm and dry. No rash noted. No erythema. No pallor.  Psychiatric: Her behavior is normal. Thought content normal. Her mood appears anxious. Her affect is not blunt, not labile and not inappropriate. Thought content is not paranoid. She exhibits a depressed mood. She expresses no homicidal and no suicidal ideation.  Quite anxious Not tearful             Assessment & Plan:   Problem List Items Addressed This Visit      Other   Adjustment disorder with mixed anxiety and depressed mood - Primary    With many  physical symptoms - ? If some somatization  Biggest trigger is chronic back pain  Reviewed stressors/ coping techniques/symptoms/ support sources/ tx options and side effects in detail today  She has great worries about her general health  Will tx with SSRI   (pt aware she will need to stop it when she plans to get pregnant) Rev poss side eff in great detail incl worse mood or SI-will update and stop med if needed   F/u planned Continue counseling  Will continue to work on individual symptoms as  well   >25 minutes spent in face to face time with patient, >50% spent in counselling or coordination of care     Myofascial pain    I do suspect poss fibromyalgia Pt does have trigger points -no joint swelling etc Also chronic back pain  Also mood disorder Will begin zoloft - see assessment for anx Disc further at f/u  Already on gabapentin and that could also be adv Recommend staying as active as possible     Tachycardia    Suspect this is due to anxiety  Rev ER records/ incl EKG/ labs and studies in detail with pt and husband  HR is coming down  Will work on anxiety and continue to follow

## 2013-12-25 NOTE — Assessment & Plan Note (Signed)
Suspect this is due to anxiety  Rev ER records/ incl EKG/ labs and studies in detail with pt and husband  HR is coming down  Will work on anxiety and continue to follow

## 2013-12-25 NOTE — Assessment & Plan Note (Signed)
I do suspect poss fibromyalgia Pt does have trigger points -no joint swelling etc Also chronic back pain  Also mood disorder Will begin zoloft - see assessment for anx Disc further at f/u  Already on gabapentin and that could also be adv Recommend staying as active as possible

## 2013-12-25 NOTE — Assessment & Plan Note (Signed)
With many physical symptoms - ? If some somatization  Biggest trigger is chronic back pain  Reviewed stressors/ coping techniques/symptoms/ support sources/ tx options and side effects in detail today  She has great worries about her general health  Will tx with SSRI   (pt aware she will need to stop it when she plans to get pregnant) Rev poss side eff in great detail incl worse mood or SI-will update and stop med if needed   F/u planned Continue counseling  Will continue to work on individual symptoms as well   >25 minutes spent in face to face time with patient, >50% spent in counselling or coordination of care

## 2013-12-26 ENCOUNTER — Telehealth: Payer: Self-pay | Admitting: Family Medicine

## 2013-12-26 MED ORDER — CYCLOBENZAPRINE HCL 10 MG PO TABS: 10.0000 mg | ORAL_TABLET | Freq: Every day | ORAL | Status: DC

## 2013-12-26 NOTE — Telephone Encounter (Signed)
Pt spouse came in to get a refill on flexeril [employer] Pt is completely out  Please advise when called in

## 2013-12-26 NOTE — Telephone Encounter (Signed)
Left voicemail letting pt know Rx sent to pharmacy  

## 2013-12-26 NOTE — Telephone Encounter (Signed)
I will send it now

## 2014-01-06 ENCOUNTER — Telehealth: Payer: Self-pay

## 2014-01-06 ENCOUNTER — Ambulatory Visit (INDEPENDENT_AMBULATORY_CARE_PROVIDER_SITE_OTHER): Payer: Self-pay | Admitting: Neurological Surgery

## 2014-01-06 NOTE — Telephone Encounter (Signed)
Please add to allergy list - let it get out of her system and let me know next week if symptoms are not improved

## 2014-01-06 NOTE — Telephone Encounter (Signed)
Pt notified to stop med and update [place] if sxs don't improve next week, med added to allergy list

## 2014-01-06 NOTE — Telephone Encounter (Signed)
Pt left v/m; in 12/2013 visit pt was prescribed zoloft; pt  Symptoms worsened and pt developed palpitations,nausea and sweating.pt stopped Zoloft 2 - 3 days ago and wanted Dr Glori Bickers to be aware. Have not put on allergy and adverse reaction list until Dr Glori Bickers is advised of new symptoms.

## 2014-01-07 ENCOUNTER — Ambulatory Visit (INDEPENDENT_AMBULATORY_CARE_PROVIDER_SITE_OTHER): Payer: Self-pay | Admitting: Neurological Surgery

## 2014-01-09 ENCOUNTER — Encounter: Payer: Self-pay | Admitting: Family Medicine

## 2014-01-21 ENCOUNTER — Ambulatory Visit: Payer: 59 | Admitting: Family Medicine

## 2014-02-17 ENCOUNTER — Ambulatory Visit (INDEPENDENT_AMBULATORY_CARE_PROVIDER_SITE_OTHER): Payer: 59 | Admitting: Family Medicine

## 2014-02-17 ENCOUNTER — Encounter: Payer: Self-pay | Admitting: Family Medicine

## 2014-02-17 VITALS — BP 118/72 | HR 101 | Temp 98.6°F | Ht 64.5 in | Wt 162.8 lb

## 2014-02-17 DIAGNOSIS — K219 Gastro-esophageal reflux disease without esophagitis: Secondary | ICD-10-CM

## 2014-02-17 DIAGNOSIS — N644 Mastodynia: Secondary | ICD-10-CM | POA: Insufficient documentation

## 2014-02-17 DIAGNOSIS — R110 Nausea: Secondary | ICD-10-CM | POA: Insufficient documentation

## 2014-02-17 DIAGNOSIS — F4323 Adjustment disorder with mixed anxiety and depressed mood: Secondary | ICD-10-CM

## 2014-02-17 LAB — POCT URINE PREGNANCY: Preg Test, Ur: NEGATIVE

## 2014-02-17 MED ORDER — OMEPRAZOLE 20 MG PO CPDR: 20.0000 mg | DELAYED_RELEASE_CAPSULE | Freq: Two times a day (BID) | ORAL | Status: DC

## 2014-02-17 MED ORDER — BUSPIRONE HCL 15 MG PO TABS: 7.5000 mg | ORAL_TABLET | Freq: Two times a day (BID) | ORAL | Status: DC

## 2014-02-17 NOTE — Patient Instructions (Signed)
Lab today for H pylori  Take omeprazole twice daily to see if it helps with nausea and GI symptoms  Think about trying the buspar for anxiety -- wait until you have 2 weeks of omeprazole first  Follow up with me in 2-3 months

## 2014-02-17 NOTE — Progress Notes (Signed)
Pre visit review using our clinic review tool, if applicable. No additional management support is needed unless otherwise documented below in the visit note. 

## 2014-02-17 NOTE — Progress Notes (Signed)
Subjective:    Patient ID: Sarah Castillo, female    DOB: January 07, 1986, 29 y.o.   MRN: [id number]  HPI Here for nausea and other problems   She tried zoloft- caused her to feel more down and also nauseated and sweating  Stopped it after a week   Since then she has been nauseated  At times gassy and bloated  Takes prilosec No particular food trigger  Does not think she is pregnant  Thinks she started her period (spotting) this am -- is on time for it   Not related to anxious times   No nsaids  No heartburn  Some occ stomach pain - L sided    She stopped her flexeril  She wanted to stop it due to being on for a long time   Has some darkening of the skin /bump under both arms -tender  Her gyn looked at it -told her to see derm/ derm told her it is not a mole  Breasts are sore on and off   Urine preg test neg today  Patient Active Problem List   Diagnosis Date Noted  . Nausea without vomiting 02/17/2014  . Breast pain 02/17/2014  . Myofascial pain 12/24/2013  . Tachycardia 12/24/2013  . PCOS (polycystic ovarian syndrome) 11/25/2013  . Adjustment disorder with mixed anxiety and depressed mood 11/25/2013  . Allergic rhinitis 11/25/2013  . GERD (gastroesophageal reflux disease) 11/25/2013  . IBS (irritable bowel syndrome) 11/25/2013  . Left knee pain 11/06/2013  . S/P lumbar fusion 11/06/2013   Past Medical History  Diagnosis Date  . Anxiety   . Depression   . PCOS (polycystic ovarian syndrome)   . IBS (irritable bowel syndrome)    Past Surgical History  Procedure Laterality Date  . Lumbar fusion  12/2012    L5S1  . Lumbar disc surgery  09/2007    L5S1  . Lasik  2012  . Colonoscopy w/ biopsies  01/19/09    normal  . Esophagogastroduodenoscopy  08/03/08    mild gastritis and HH   History  Substance Use Topics  . Smoking status: Never Smoker   . Smokeless tobacco: Never Used  . Alcohol Use: No   Family History  Problem Relation Age of Onset  .  Colon cancer Cousin   . Diabetes Paternal Grandmother   . Diabetes Paternal Grandfather   . Diabetes Maternal Grandmother   . Diabetes Maternal Grandfather   . Heart attack Father   . Hypertension Mother   . Hypertension Father   . Thyroid disease Mother   . Hyperlipidemia Mother   . Hyperlipidemia Father    Allergies  Allergen Reactions  . Metformin And Related Diarrhea    GI side eff   . Morphine Other (See Comments)    Other Reaction: FEVER, RASH  . Zoloft [Sertraline]    Current Outpatient Prescriptions on File Prior to Visit  Medication Sig Dispense Refill  . acetaminophen (TYLENOL) 500 MG tablet Take 1,000 mg by mouth daily.    . Biotin 5000 MCG CAPS Take 1 capsule by mouth daily.    . fluticasone (FLONASE) 50 MCG/ACT nasal spray Place 2 sprays into both nostrils daily as needed. 16 g 3  . gabapentin (NEURONTIN) 300 MG capsule Take 300 mg by mouth at bedtime.    Marland Kitchen loratadine (CLARITIN) 10 MG tablet Take 5 mg by mouth at bedtime.    . ALPRAZolam (XANAX) 0.5 MG tablet Take 1 tablet (0.5 mg total) by mouth 2 (two) times  daily as needed. (Patient not taking: Reported on 02/17/2014) 30 tablet 1  . cyclobenzaprine (FLEXERIL) 10 MG tablet Take 1 tablet (10 mg total) by mouth at bedtime. (Patient not taking: Reported on 02/17/2014) 30 tablet 3  . diclofenac sodium (VOLTAREN) 1 % GEL Apply 2 g topically 4 (four) times daily. As needed for pain (Patient not taking: Reported on 02/17/2014) 100 g 1   No current facility-administered medications on file prior to visit.    Review of Systems Review of Systems  Constitutional: Negative for fever, appetite change, and unexpected weight change.  Eyes: Negative for pain and visual disturbance.  Respiratory: Negative for cough and shortness of breath.   Cardiovascular: Negative for cp or palpitations    Gastrointestinal: Negative for , diarrhea and constipation. pos for nausea and bloating  Genitourinary: Negative for urgency and frequency.    Skin: Negative for pallor or rash   MSK pos for chronic back pain and also myofascial pain  Neurological: Negative for weakness, light-headedness, numbness and headaches.  Hematological: Negative for adenopathy. Does not bruise/bleed easily.  Psychiatric/Behavioral: pos for anxious feelings and occ depressed ones, neg for SI         Objective:   Physical Exam  Constitutional: She appears well-developed and well-nourished. No distress.  overwt and well app  HENT:  Head: Normocephalic and atraumatic.  Mouth/Throat: Oropharynx is clear and moist.  Eyes: Conjunctivae and EOM are normal. Pupils are equal, round, and reactive to light. Right eye exhibits no discharge. Left eye exhibits no discharge. No scleral icterus.  Neck: Normal range of motion. Neck supple. No JVD present. No thyromegaly present.  Cardiovascular: Normal rate, regular rhythm, normal heart sounds and intact distal pulses.  Exam reveals no gallop.   Pulmonary/Chest: Effort normal and breath sounds normal. No respiratory distress. She has no wheezes. She has no rales.  Abdominal: Soft. Bowel sounds are normal. She exhibits no distension and no mass. There is no hepatosplenomegaly. There is tenderness in the epigastric area. There is no rigidity, no rebound, no guarding, no CVA tenderness, no tenderness at McBurney's point and negative Murphy's sign.  Genitourinary:  Pt has prominent fat pads in lower axillae bilat with hyperpigmentation of skin in areas of friction No lumps noted  Slightly tender    Musculoskeletal: She exhibits no edema or tenderness.  Lymphadenopathy:    She has no cervical adenopathy.  Neurological: She is alert. She has normal reflexes. No cranial nerve deficit. She exhibits normal muscle tone. Coordination normal.  Skin: Skin is warm and dry. No rash noted. No erythema. No pallor.  Psychiatric: Her speech is normal and behavior is normal. Thought content normal. Her mood appears anxious. Her affect is  not blunt, not labile and not inappropriate.          Assessment & Plan:   Problem List Items Addressed This Visit      Digestive   GERD (gastroesophageal reflux disease)    Now having more nausea Given omeprazole - inc from qd to bid  Disc gerd diet and enc wt loss  h pylori test today  F/u 2-3 mo      Relevant Medications   omeprazole (PRILOSEC) capsule     Other   Adjustment disorder with mixed anxiety and depressed mood    I still think that anxiety/mood disorder may fuel some of her somatic complaints -she is not sure  Did not tolerate zoloft Given written px for buspirone-disc poss side eff - she will disc with  her husband -re: whether to try (pt is apprehensive) Enc counseling Also exercise Reviewed stressors/ coping techniques/symptoms/ support sources/ tx options and side effects in detail today   F/u 2-79mo      Breast pain    Pt c/o discomfort over areas of fat pads in axillae  No lumps noted  No cysts - but she does have hyperpigmentation from friction  Suggested wt loss and a compression bra that covers this area to protect from friction  Will continue to monitor  Has been seen by gyn and also derm for this       Nausea without vomiting - Primary    I think this may be due to gastritis /GERD and anxiety  Not on nsaids Will inc her omeprazole to bid from qd Disc foods/ GERD diet  Continue to tackle anxiety  upreg neg today  h pylori today  Has had extensive GI w/u in the past  F/u planned 2-3 mo       Relevant Orders   POCT urine pregnancy (Completed)   H. pylori antibody, IgG

## 2014-02-18 LAB — H. PYLORI ANTIBODY, IGG: H PYLORI IGG: NEGATIVE

## 2014-02-18 NOTE — Assessment & Plan Note (Signed)
Pt c/o discomfort over areas of fat pads in axillae  No lumps noted  No cysts - but she does have hyperpigmentation from friction  Suggested wt loss and a compression bra that covers this area to protect from friction  Will continue to monitor  Has been seen by gyn and also derm for this

## 2014-02-18 NOTE — Assessment & Plan Note (Addendum)
I think this may be due to gastritis /GERD and anxiety  Not on nsaids Will inc her omeprazole to bid from qd Disc foods/ GERD diet  Continue to tackle anxiety  upreg neg today  h pylori today  Has had extensive GI w/u in the past  F/u planned 2-3 mo

## 2014-02-18 NOTE — Assessment & Plan Note (Signed)
I still think that anxiety/mood disorder may fuel some of her somatic complaints -she is not sure  Did not tolerate zoloft Given written px for buspirone-disc poss side eff - she will disc with her husband -re: whether to try (pt is apprehensive) Enc counseling Also exercise Reviewed stressors/ coping techniques/symptoms/ support sources/ tx options and side effects in detail today   F/u 2-30mo

## 2014-02-18 NOTE — Assessment & Plan Note (Signed)
Now having more nausea Given omeprazole - inc from qd to bid  Disc gerd diet and enc wt loss  h pylori test today  F/u 2-3 mo

## 2014-03-11 ENCOUNTER — Ambulatory Visit
Admission: RE | Admit: 2014-03-11 | Discharge: 2014-03-11 | Disposition: A | Discharge status: Home / Self Care | Payer: 59 | Source: Ambulatory Visit | Attending: Neurological Surgery | Admitting: Neurological Surgery

## 2014-03-11 ENCOUNTER — Ambulatory Visit (INDEPENDENT_AMBULATORY_CARE_PROVIDER_SITE_OTHER): Payer: 59 | Admitting: Neurological Surgery

## 2014-03-11 ENCOUNTER — Encounter (INDEPENDENT_AMBULATORY_CARE_PROVIDER_SITE_OTHER): Payer: Self-pay | Admitting: Neurological Surgery

## 2014-03-11 VITALS — BP 110/78 | HR 90 | Temp 98.9°F | Resp 14 | Wt 162.2 lb

## 2014-03-11 DIAGNOSIS — M5440 Lumbago with sciatica, unspecified side: Secondary | ICD-10-CM | POA: Insufficient documentation

## 2014-03-11 DIAGNOSIS — M5136 Other intervertebral disc degeneration, lumbar region: Secondary | ICD-10-CM

## 2014-03-11 DIAGNOSIS — M5416 Radiculopathy, lumbar region: Secondary | ICD-10-CM | POA: Insufficient documentation

## 2014-03-12 NOTE — Progress Notes (Signed)
Service Date: 03/11/2014     Patient Type: O     PHYSICIAN/PROVIDER: Marlaine Hind MD     HISTORY OF PRESENT ILLNESS:  She presents today for followup status post TLIF at L5-S1 in December 2014.   The patient's doing relatively well.  She was pain free, but over the last  6 months, has begun to feel a little bit of pressure around her hips,  thighs and muscles.  Some of this is since stopping the muscle relaxant.   Symptoms are aggravated by standing and sitting, better with stretching,  physical therapy, use of elliptical.  The pain that she is experiencing now  is not as significant as the pain that she was experiencing preoperatively.   Her pain level now is approximately 2/10 at its worst whereas before it  was a 7 to 8/10.  She was recently at urgent care x2 for likely  patellofemoral syndrome.     X-rays today flexion, extension x-rays shows solid fusion and TLIF at L5-S1  with good positioning of the interbody hardware and posterolateral  instrumentation.     PHYSICAL EXAMINATION:  She has got 5/5 strength on psoas, quadriceps, dorsiflexion, plantar  flexion.  Reflexes +2 for the knee jerk, 2 on the left ankle jerk, 1 on the  right ankle jerk.  Mild decreased sensation to light touch and pinprick  over the anterior lower leg.  Straight leg testing negative.  Range of  motion is intact.  No truncal ataxia.  Normal gait and normal tandem walk.     ASSESSMENT:  Overall, the patient is doing quite well with minimal pain.  X-rays shows  solid fusion.     PLAN:  The patient may follow up with me in 1 year.  She is interested in getting  pregnant so Neurontin will be discontinued.  Her only medication currently  is Tylenol and it is on an as-needed basis.  She may follow up with me on a  1-year basis p.r.n.           JFH

## 2014-03-23 ENCOUNTER — Telehealth: Payer: Self-pay

## 2014-03-23 NOTE — Telephone Encounter (Signed)
I will see her then  

## 2014-03-23 NOTE — Telephone Encounter (Signed)
Pt has appt 03/25/14 with Dr Glori Bickers.

## 2014-03-23 NOTE — Telephone Encounter (Signed)
PLEASE NOTE: All timestamps contained within this report are represented as [place] Standard Time. CONFIDENTIALTY NOTICE: This fax transmission is intended only for the addressee. It contains information that is legally privileged, confidential or otherwise protected from use or disclosure. If you are not the intended recipient, you are strictly prohibited from reviewing, disclosing, copying using or disseminating any of this information or taking any action in reliance on or regarding this information. If you have received this fax in error, please notify us immediately by telephone so that we can arrange for its return to [place]. Phone: [phone], Toll-Free: [phone], Fax: [phone] Page: 1 of 1 Call Id: [id number] [place] Patient Name: Sarah Castillo Gender: Female DOB: 11-17-1985 Age: 29 Y 3 M 13 D Return Phone Number: [phone] (Primary), [phone] (Secondary) Address[place] [address] Client [facility] - Client Client Site [employer], [place] Contact Type Call Call Type Triage / Clinical Relationship To Patient Self Appointment Disposition EMR Appointment Not Necessary Return Phone Number [phone] (Primary) Chief Complaint Medication Question (non symptomatic) Initial Comment Caller states has a question about medication waking up anxious in the morning Info pasted into Epic No Nurse Assessment Guidelines Guideline Title Affirmed Question Affirmed Notes Nurse Date/Time (Eastern Time) Disp. Time Eilene Ghazi Time) Disposition Final User 03/23/2014 3:53:02 PM Attempt made - message left [place], [place] 03/23/2014 4:14:32 PM Attempt made - no message left [place], [place] 03/23/2014 4:26:32 PM FINAL ATTEMPT MADE - no message left Yes Melina Modena, RN, [facility] After Care Instructions Given Call Event  Type User Date / Time Description

## 2014-03-25 ENCOUNTER — Ambulatory Visit (INDEPENDENT_AMBULATORY_CARE_PROVIDER_SITE_OTHER): Payer: 59 | Admitting: Family Medicine

## 2014-03-25 ENCOUNTER — Encounter: Payer: Self-pay | Admitting: Family Medicine

## 2014-03-25 VITALS — BP 110/80 | HR 91 | Temp 98.5°F | Ht 64.5 in | Wt 162.0 lb

## 2014-03-25 DIAGNOSIS — R5382 Chronic fatigue, unspecified: Secondary | ICD-10-CM

## 2014-03-25 DIAGNOSIS — F4323 Adjustment disorder with mixed anxiety and depressed mood: Secondary | ICD-10-CM

## 2014-03-25 DIAGNOSIS — R5383 Other fatigue: Secondary | ICD-10-CM | POA: Insufficient documentation

## 2014-03-25 DIAGNOSIS — R21 Rash and other nonspecific skin eruption: Secondary | ICD-10-CM

## 2014-03-25 MED ORDER — MOMETASONE FUROATE 0.1 % EX CREA: 1.0000 "application " | TOPICAL_CREAM | Freq: Every day | CUTANEOUS | Status: DC | PRN

## 2014-03-25 NOTE — Patient Instructions (Signed)
Stop at check out for referral to counseling and dermatology  Try the elocon cream for rash as needed in the meantime

## 2014-03-25 NOTE — Progress Notes (Signed)
Subjective:    Patient ID: Sarah Castillo, female    DOB: 01-23-1985, 29 y.o.   MRN: [id number]  HPI Here for f/u of anxiety and rash   Last visit - given buspar for anxiety (she was intol of zoloft)  She noticed at night it made her more anxious before she went to bed  Stopped it and started it again   Also made her feel tired and a bit dizzy  Took it for 1 1/2 weeks   (but bid for only a few days)  Right now -she is generally anxious "freaking out" easily  For example when driving/riding in a car   Used to see a counselor   Has some xanax - to take for prn - she avoids taking it   Knows she cannot take it when pregnant   Gets dermatitis on and off  Wonders if she has a food allergy-but cannot isolate what it is   Did inc her omeprazole after last visit  That is helping her nausea  No abdominal pain  H pylori test was negative   Generally sluggish  Would like labs to check her kidneys/liver and blood count Also wonders about thyroid   Patient Active Problem List   Diagnosis Date Noted  . Fatigue 03/25/2014  . Rash and nonspecific skin eruption 03/25/2014  . Nausea without vomiting 02/17/2014  . Breast pain 02/17/2014  . Myofascial pain 12/24/2013  . Tachycardia 12/24/2013  . PCOS (polycystic ovarian syndrome) 11/25/2013  . Adjustment disorder with mixed anxiety and depressed mood 11/25/2013  . Allergic rhinitis 11/25/2013  . GERD (gastroesophageal reflux disease) 11/25/2013  . IBS (irritable bowel syndrome) 11/25/2013  . Left knee pain 11/06/2013  . S/P lumbar fusion 11/06/2013   Past Medical History  Diagnosis Date  . Anxiety   . Depression   . PCOS (polycystic ovarian syndrome)   . IBS (irritable bowel syndrome)    Past Surgical History  Procedure Laterality Date  . Lumbar fusion  12/2012    L5S1  . Lumbar disc surgery  09/2007    L5S1  . Lasik  2012  . Colonoscopy w/ biopsies  01/19/09    normal  . Esophagogastroduodenoscopy  08/03/08   mild gastritis and HH   History  Substance Use Topics  . Smoking status: Never Smoker   . Smokeless tobacco: Never Used  . Alcohol Use: No   Family History  Problem Relation Age of Onset  . Colon cancer Cousin   . Diabetes Paternal Grandmother   . Diabetes Paternal Grandfather   . Diabetes Maternal Grandmother   . Diabetes Maternal Grandfather   . Heart attack Father   . Hypertension Mother   . Hypertension Father   . Thyroid disease Mother   . Hyperlipidemia Mother   . Hyperlipidemia Father    Allergies  Allergen Reactions  . Metformin And Related Diarrhea    GI side eff   . Morphine Other (See Comments)    Other Reaction: FEVER, RASH  . Zoloft [Sertraline]    Current Outpatient Prescriptions on File Prior to Visit  Medication Sig Dispense Refill  . acetaminophen (TYLENOL) 500 MG tablet Take 1,000 mg by mouth daily.    Marland Kitchen ALPRAZolam (XANAX) 0.5 MG tablet Take 1 tablet (0.5 mg total) by mouth 2 (two) times daily as needed. 30 tablet 1  . fluticasone (FLONASE) 50 MCG/ACT nasal spray Place 2 sprays into both nostrils daily as needed. 16 g 3  . loratadine (CLARITIN) 10 MG  tablet Take 5 mg by mouth at bedtime.    Marland Kitchen omeprazole (PRILOSEC) 20 MG capsule Take 1 capsule (20 mg total) by mouth 2 (two) times daily before a meal. 180 capsule 3   No current facility-administered medications on file prior to visit.    Review of Systems Review of Systems  Constitutional: Negative for fever, appetite change, and unexpected weight change. pos for fatigue  Eyes: Negative for pain and visual disturbance.  Respiratory: Negative for cough and shortness of breath.   Cardiovascular: Negative for cp or palpitations    Gastrointestinal: Negative for nausea, diarrhea and constipation.  Genitourinary: Negative for urgency and frequency.  Skin: Negative for pallor and pos for intermittent  rash Neurological: Negative for weakness, light-headedness, numbness and headaches.  Hematological:  Negative for adenopathy. Does not bruise/bleed easily.  Psychiatric/Behavioral: pos for anxiety and depressive siymptoms         Objective:   Physical Exam  Constitutional: She appears well-developed and well-nourished. No distress.  overwt and well app  HENT:  Head: Normocephalic and atraumatic.  Mouth/Throat: Oropharynx is clear and moist.  Eyes: Conjunctivae and EOM are normal. Pupils are equal, round, and reactive to light. Right eye exhibits no discharge. Left eye exhibits no discharge. No scleral icterus.  Neck: Normal range of motion. Neck supple. No JVD present. Carotid bruit is not present. No thyromegaly present.  Cardiovascular: Normal rate, regular rhythm, normal heart sounds and intact distal pulses.  Exam reveals no gallop.   No murmur heard. Pulmonary/Chest: Effort normal and breath sounds normal. No respiratory distress. She has no wheezes. She has no rales.  Abdominal: Soft. She exhibits no distension. There is no tenderness.  Musculoskeletal: She exhibits no edema.  Lymphadenopathy:    She has no cervical adenopathy.  Neurological: She is alert.  Skin: Skin is warm and dry. No rash noted. No erythema. No pallor.  Psychiatric: Her behavior is normal. Thought content normal. Her mood appears anxious. Her affect is not blunt, not labile and not inappropriate. Her speech is tangential. Thought content is not paranoid. She expresses no homicidal and no suicidal ideation.  Generally anxious-particularly about her health Tangential in speech          Assessment & Plan:   Problem List Items Addressed This Visit      Musculoskeletal and Integument   Rash and nonspecific skin eruption    Intermittent rash- on neck and face -with dry skin and then hyperpigmentation  Pt suspects allergy-but is not sure/cannot find a trigger Px elocon cream to use prn  Ref to dermatology      Relevant Orders   Ambulatory referral to Dermatology     Other   Adjustment disorder with  mixed anxiety and depressed mood    Would like ref to new therapist  Has been intolerant of zoloft and buspar  She is most worried about health issues  Mood issues may add to some of her physical symptoms       Relevant Orders   Ambulatory referral to Psychology   Fatigue - Primary    Generalized with malaise  Labs today  Disc role of anx/depression in this       Relevant Orders   CBC with Differential/Platelet (Completed)   Comprehensive metabolic panel (Completed)   TSH (Completed)   Vit D  25 hydroxy (rtn osteoporosis monitoring) (Completed)

## 2014-03-25 NOTE — Progress Notes (Signed)
Pre visit review using our clinic review tool, if applicable. No additional management support is needed unless otherwise documented below in the visit note. 

## 2014-03-26 ENCOUNTER — Other Ambulatory Visit: Payer: Self-pay | Admitting: *Deleted

## 2014-03-26 LAB — COMPREHENSIVE METABOLIC PANEL
ALBUMIN: 4.6 g/dL (ref 3.5–5.2)
ALK PHOS: 63 U/L (ref 39–117)
ALT: 31 U/L (ref 0–35)
AST: 26 U/L (ref 0–37)
BILIRUBIN TOTAL: 0.3 mg/dL (ref 0.2–1.2)
BUN: 10 mg/dL (ref 6–23)
CHLORIDE: 103 meq/L (ref 96–112)
CO2: 28 mEq/L (ref 19–32)
CREATININE: 0.79 mg/dL (ref 0.40–1.20)
Calcium: 9.6 mg/dL (ref 8.4–10.5)
GFR: 92.58 mL/min (ref >60.00)
Glucose, Bld: 77 mg/dL (ref 70–99)
POTASSIUM: 4 meq/L (ref 3.5–5.1)
SODIUM: 137 meq/L (ref 135–145)
TOTAL PROTEIN: 7.7 g/dL (ref 6.0–8.3)

## 2014-03-26 LAB — CBC WITH DIFFERENTIAL/PLATELET
BASOS ABS: 0 10*3/uL (ref 0.0–0.1)
Basophils Relative: 0.5 % (ref 0.0–3.0)
EOS PCT: 3.9 % (ref 0.0–5.0)
Eosinophils Absolute: 0.2 10*3/uL (ref 0.0–0.7)
HCT: 34.4 % — ABNORMAL LOW (ref 36.0–46.0)
HEMOGLOBIN: 11.3 g/dL — AB (ref 12.0–15.0)
LYMPHS PCT: 37.2 % (ref 12.0–46.0)
Lymphs Abs: 2 10*3/uL (ref 0.7–4.0)
MCHC: 32.8 g/dL (ref 30.0–36.0)
MCV: 72.5 fl — AB (ref 78.0–100.0)
MONO ABS: 0.3 10*3/uL (ref 0.1–1.0)
Monocytes Relative: 6.2 % (ref 3.0–12.0)
NEUTROS PCT: 52.2 % (ref 43.0–77.0)
Neutro Abs: 2.8 10*3/uL (ref 1.4–7.7)
PLATELETS: 330 10*3/uL (ref 150.0–400.0)
RBC: 4.75 Mil/uL (ref 3.87–5.11)
RDW: 16.4 % — ABNORMAL HIGH (ref 11.5–15.5)
WBC: 5.4 10*3/uL (ref 4.0–10.5)

## 2014-03-26 LAB — TSH: TSH: 1.72 u[IU]/mL (ref 0.35–4.50)

## 2014-03-26 LAB — VITAMIN D 25 HYDROXY (VIT D DEFICIENCY, FRACTURES): VITD: 12.21 ng/mL — ABNORMAL LOW (ref 30.00–100.00)

## 2014-03-26 MED ORDER — ERGOCALCIFEROL 1.25 MG (50000 UT) PO CAPS: 50000.0000 [IU] | ORAL_CAPSULE | ORAL | Status: DC

## 2014-03-26 NOTE — Assessment & Plan Note (Signed)
Intermittent rash- on neck and face -with dry skin and then hyperpigmentation  Pt suspects allergy-but is not sure/cannot find a trigger Px elocon cream to use prn  Ref to dermatology

## 2014-03-26 NOTE — Assessment & Plan Note (Signed)
Generalized with malaise  Labs today  Disc role of anx/depression in this

## 2014-03-26 NOTE — Assessment & Plan Note (Signed)
Would like ref to new therapist  Has been intolerant of zoloft and buspar  She is most worried about health issues  Mood issues may add to some of her physical symptoms

## 2014-04-01 ENCOUNTER — Telehealth: Payer: Self-pay | Admitting: Family Medicine

## 2014-04-01 NOTE — Telephone Encounter (Signed)
Pt called in wantingt o know about lab work and if a medication has been called into the pharmacy.  She says she has been expecting a call for some time. Please call her back at [phone] Thanks.

## 2014-04-01 NOTE — Telephone Encounter (Signed)
Spoke with patient regarding lab results and vitamin d directions.  Patient was very upset that she did not get a phone call from the office regarding her lab results.  Explained to patient that mychart was used per her request.  Patient said "she expects a call."  Suggested patient not use mychart and that if she has questions she can call or send a message.  Patient was unhappy, she pays for our services and she expects to be called regardless.

## 2014-04-16 ENCOUNTER — Ambulatory Visit (INDEPENDENT_AMBULATORY_CARE_PROVIDER_SITE_OTHER): Payer: 59 | Admitting: Psychology

## 2014-04-16 DIAGNOSIS — F419 Anxiety disorder, unspecified: Secondary | ICD-10-CM

## 2014-04-27 ENCOUNTER — Encounter (HOSPITAL_COMMUNITY): Payer: Self-pay | Admitting: *Deleted

## 2014-04-27 ENCOUNTER — Encounter: Payer: Self-pay | Admitting: Obstetrics and Gynecology

## 2014-04-27 ENCOUNTER — Inpatient Hospital Stay (HOSPITAL_COMMUNITY): Payer: 59

## 2014-04-27 ENCOUNTER — Inpatient Hospital Stay (HOSPITAL_COMMUNITY)
Admission: AD | Admit: 2014-04-27 | Discharge: 2014-04-27 | Disposition: A | Discharge status: Home / Self Care | Payer: 59 | Source: Ambulatory Visit | Attending: Obstetrics & Gynecology | Admitting: Obstetrics & Gynecology

## 2014-04-27 ENCOUNTER — Ambulatory Visit (INDEPENDENT_AMBULATORY_CARE_PROVIDER_SITE_OTHER): Payer: 59 | Admitting: Obstetrics and Gynecology

## 2014-04-27 ENCOUNTER — Telehealth: Payer: Self-pay | Admitting: Obstetrics and Gynecology

## 2014-04-27 ENCOUNTER — Ambulatory Visit: Payer: 59 | Admitting: Obstetrics and Gynecology

## 2014-04-27 VITALS — BP 118/70 | HR 88 | Resp 22 | Ht 65.0 in | Wt 165.0 lb

## 2014-04-27 DIAGNOSIS — R1032 Left lower quadrant pain: Secondary | ICD-10-CM | POA: Insufficient documentation

## 2014-04-27 DIAGNOSIS — O9989 Other specified diseases and conditions complicating pregnancy, childbirth and the puerperium: Secondary | ICD-10-CM | POA: Diagnosis not present

## 2014-04-27 DIAGNOSIS — R102 Pelvic and perineal pain: Secondary | ICD-10-CM | POA: Insufficient documentation

## 2014-04-27 DIAGNOSIS — Z3A01 Less than 8 weeks gestation of pregnancy: Secondary | ICD-10-CM | POA: Diagnosis not present

## 2014-04-27 DIAGNOSIS — N912 Amenorrhea, unspecified: Secondary | ICD-10-CM

## 2014-04-27 DIAGNOSIS — R350 Frequency of micturition: Secondary | ICD-10-CM | POA: Diagnosis not present

## 2014-04-27 DIAGNOSIS — O26891 Other specified pregnancy related conditions, first trimester: Secondary | ICD-10-CM | POA: Diagnosis not present

## 2014-04-27 DIAGNOSIS — R1030 Lower abdominal pain, unspecified: Secondary | ICD-10-CM

## 2014-04-27 DIAGNOSIS — O3680X0 Pregnancy with inconclusive fetal viability, not applicable or unspecified: Secondary | ICD-10-CM

## 2014-04-27 DIAGNOSIS — O26851 Spotting complicating pregnancy, first trimester: Secondary | ICD-10-CM | POA: Diagnosis not present

## 2014-04-27 LAB — POCT URINALYSIS DIPSTICK
Bilirubin, UA: NEGATIVE
Blood, UA: NEGATIVE
GLUCOSE UA: NEGATIVE
Ketones, UA: NEGATIVE
Leukocytes, UA: NEGATIVE
Nitrite, UA: NEGATIVE
Protein, UA: NEGATIVE
Urobilinogen, UA: NEGATIVE
pH, UA: 5

## 2014-04-27 LAB — POCT URINE PREGNANCY: PREG TEST UR: POSITIVE

## 2014-04-27 LAB — CBC WITH DIFFERENTIAL/PLATELET
BASOS PCT: 1 % (ref 0–1)
Basophils Absolute: 0.1 10*3/uL (ref 0.0–0.1)
EOS ABS: 0.2 10*3/uL (ref 0.0–0.7)
Eosinophils Relative: 2 % (ref 0–5)
HEMATOCRIT: 35.8 % — AB (ref 36.0–46.0)
HEMOGLOBIN: 12 g/dL (ref 12.0–15.0)
Lymphocytes Relative: 24 % (ref 12–46)
Lymphs Abs: 2.3 10*3/uL (ref 0.7–4.0)
MCH: 25.1 pg — ABNORMAL LOW (ref 26.0–34.0)
MCHC: 33.5 g/dL (ref 30.0–36.0)
MCV: 74.7 fL — ABNORMAL LOW (ref 78.0–100.0)
MONO ABS: 0.6 10*3/uL (ref 0.1–1.0)
MONOS PCT: 6 % (ref 3–12)
NEUTROS PCT: 67 % (ref 43–77)
Neutro Abs: 6.5 10*3/uL (ref 1.7–7.7)
Platelets: 246 10*3/uL (ref 150–400)
RBC: 4.79 MIL/uL (ref 3.87–5.11)
RDW: 17.3 % — AB (ref 11.5–15.5)
WBC: 9.6 10*3/uL (ref 4.0–10.5)

## 2014-04-27 LAB — HCG, QUANTITATIVE, PREGNANCY: HCG, BETA CHAIN, QUANT, S: 9706 m[IU]/mL — AB (ref <5)

## 2014-04-27 NOTE — Telephone Encounter (Signed)
Spoke with patient. Patient has taken 3 UPT which have been positive. LMP 03/22/2014. Is experiencing LLQ pain that comes and goes. Describes pain as "sharp and cramping." Began to have spotting last night. Patient denies any current bleeding. Is currently experiencing LLQ pain of 5/10. Advised patient will need to be seen in office for further evaluation today. Patient requesting to wait until tomorrow. Advised with positive UPT and symptoms she is experiencing it is recommended she be seen as soon as possible. Patient is agreeable. Requesting afternoon appointment due to husbands work schedule. Appointment scheduled for today at 2:30pm with Dr.Silva. Patient is agreeable to date and time.  Routing to provider for final review. Patient agreeable to disposition. Will close encounter

## 2014-04-27 NOTE — Progress Notes (Signed)
Patient ID: Sarah Castillo, female   DOB: 03-30-1985, 29 y.o.   MRN: [id number] GYNECOLOGY  VISIT   HPI: 29 y.o.   Married  Asian  female   Eunice with Patient's last menstrual period was 03/22/2014 (exact date).   here for LLQ pain and vaginal spotting with 3 positive home pregnancy tests. LLQ pain x2 days.  Patient also complains of urinary frequency and urgency. Positive UPT on 04/24/14.  Urinary frequency.  Bleeding last night. This was not preceded by intercourse.   Usually has monthly menses.   Urine Dip: Neg  Urine UPT:  Pos  GYNECOLOGIC HISTORY: Patient's last menstrual period was 03/22/2014 (exact date). Contraception:  None  Menopausal hormone therapy: n/a Last pap:  07-10-13 wnl:neg HR HPV Tdap: 11-25-13  No Gardasil        OB History    Gravida Para Term Preterm AB TAB SAB Ectopic Multiple Living   0 0 0 0 0 0 0 0 0 0          Patient Active Problem List   Diagnosis Date Noted  . Fatigue 03/25/2014  . Rash and nonspecific skin eruption 03/25/2014  . Nausea without vomiting 02/17/2014  . Breast pain 02/17/2014  . Myofascial pain 12/24/2013  . Tachycardia 12/24/2013  . PCOS (polycystic ovarian syndrome) 11/25/2013  . Adjustment disorder with mixed anxiety and depressed mood 11/25/2013  . Allergic rhinitis 11/25/2013  . GERD (gastroesophageal reflux disease) 11/25/2013  . IBS (irritable bowel syndrome) 11/25/2013  . Left knee pain 11/06/2013  . S/P lumbar fusion 11/06/2013    Past Medical History  Diagnosis Date  . Anxiety   . Depression   . PCOS (polycystic ovarian syndrome)   . IBS (irritable bowel syndrome)     Past Surgical History  Procedure Laterality Date  . Lumbar fusion  12/2012    L5S1  . Lumbar disc surgery  09/2007    L5S1  . Lasik  2012  . Colonoscopy w/ biopsies  01/19/09    normal  . Esophagogastroduodenoscopy  08/03/08    mild gastritis and HH    Current Outpatient Prescriptions  Medication Sig Dispense Refill  .  acetaminophen (TYLENOL) 500 MG tablet Take 1,000 mg by mouth daily.    . ergocalciferol (VITAMIN D2) 50000 UNITS capsule Take 1 capsule (50,000 Units total) by mouth once a week. 12 capsule 0  . fluticasone (FLONASE) 50 MCG/ACT nasal spray Place 2 sprays into both nostrils daily as needed. 16 g 3  . loratadine (CLARITIN) 10 MG tablet Take 5 mg by mouth at bedtime.    Marland Kitchen omeprazole (PRILOSEC) 20 MG capsule Take 1 capsule (20 mg total) by mouth 2 (two) times daily before a meal. 180 capsule 3   No current facility-administered medications for this visit.     ALLERGIES: Metformin and related; Morphine; and Zoloft  Family History  Problem Relation Age of Onset  . Colon cancer Cousin   . Diabetes Paternal Grandmother   . Diabetes Paternal Grandfather   . Diabetes Maternal Grandmother   . Diabetes Maternal Grandfather   . Heart attack Father   . Hypertension Mother   . Hypertension Father   . Thyroid disease Mother   . Hyperlipidemia Mother   . Hyperlipidemia Father     History   Social History  . Marital Status: Married    Spouse Name: N/A  . Number of Children: N/A  . Years of Education: N/A   Occupational History  . Not on file.  Social History Main Topics  . Smoking status: Never Smoker   . Smokeless tobacco: Never Used  . Alcohol Use: No  . Drug Use: No  . Sexual Activity:    Partners: Male    Birth Control/ Protection: None   Other Topics Concern  . Not on file   Social History Narrative   From [place] originally       Moved here from [place] in 2015    Husband is a medical resident at [facility] for IM       She stays home -considering school and starting a family        ROS:  Pertinent items are noted in HPI.  PHYSICAL EXAMINATION:    BP 118/70 mmHg  Pulse 88  Resp 22  Ht 5\' 5"  (1.651 m)  Wt 165 lb (74.844 kg)  BMI 27.46 kg/m2  LMP 03/22/2014 (Exact Date)     General appearance: alert, cooperative and appears stated age Lungs: clear to auscultation  bilaterally Heart: regular rate and rhythm Abdomen: soft, non-tender; no masses,  no organomegaly No abnormal inguinal nodes palpated  Pelvic: External genitalia:  no lesions              Urethra:  normal appearing urethra with no masses, tenderness or lesions              Bartholins and Skenes: normal                 Vagina: normal appearing vagina with normal color and discharge, no lesions              Cervix: normal appearance.  No blood.                   Bimanual Exam:  Uterus:  uterus is normal size, shape, consistency and nontender                                      Adnexa: normal adnexa in size, nontender and no masses                                         ASSESSMENT  Amenorhea with positive UPT. 5 + 2 weeks by dates.  LLQ pain.  Spotting last hs.  None now.  No acute abdomen.  Urinary frequency and urgency.  Negative urine dip.  Taking high dose vit D.  PLAN  To [facility] now for quant beta hCG, blood type and Rh, and pelvic ultrasound.  NPO until after evaluation is completed.  Stop Vit D 50,000 units dosing.  OK to take PNV.  UC will be sent.     An After Visit Summary was printed and given to the patient.  _15_____ minutes face to face time of which over 50% was spent in counseling.

## 2014-04-27 NOTE — MAU Note (Signed)
Pt sent from Dr. Elza Rafter office, pos UPT today @ office.  LLQ pain - cramping for last 3-4 days, spotting started last night, no bleeding today.  Urinary frequency & urgency.

## 2014-04-27 NOTE — Progress Notes (Signed)
Patient and husband concerned about the wait time. Wanting to leave. Informed patient that she needs to have lab and U/S to R/O ectopic. Requested Avie Arenas, NP to review chart and place orders. Patient and husband informed that lab will be calling her to from lobby to draw labs and U/S will call when they are ready for her. Apology to patient and husband for wait time.

## 2014-04-27 NOTE — MAU Note (Signed)
Pt fell on 04/18/2014. Left lower back has been aching al well.

## 2014-04-27 NOTE — Telephone Encounter (Signed)
Pt is having some spotting with a positive pregnancy test.

## 2014-04-27 NOTE — Discharge Instructions (Signed)
°Ectopic Pregnancy °An ectopic pregnancy is when the fertilized egg attaches (implants) outside the uterus. Most ectopic pregnancies occur in the fallopian tube. Rarely do ectopic pregnancies occur on the ovary, intestine, pelvis, or cervix. In an ectopic pregnancy, the fertilized egg does not have the ability to develop into a normal, healthy baby.  °A ruptured ectopic pregnancy is one in which the fallopian tube gets torn or bursts and results in internal bleeding. Often there is intense abdominal pain, and sometimes, vaginal bleeding. Having an ectopic pregnancy can be life threatening. If left untreated, this dangerous condition can lead to a blood transfusion, abdominal surgery, or even death. °CAUSES  °Damage to the fallopian tubes is the suspected cause in most ectopic pregnancies.  °RISK FACTORS °Depending on your circumstances, the risk of having an ectopic pregnancy will vary. The level of risk can be divided into three categories. °High Risk °· You have gone through infertility treatment. °· You have had a previous ectopic pregnancy. °· You have had previous tubal surgery. °· You have had previous surgery to have the fallopian tubes tied (tubal ligation). °· You have tubal problems or diseases. °· You have been exposed to DES. DES is a medicine that was used until 1971 and had effects on babies whose mothers took the medicine. °· You become pregnant while using an intrauterine device (IUD) for birth control.  °Moderate Risk °· You have a history of infertility. °· You have a history of a sexually transmitted infection (STI). °· You have a history of pelvic inflammatory disease (PID). °· You have scarring from endometriosis. °· You have multiple sexual partners. °· You smoke.  °Low Risk °· You have had previous pelvic surgery. °· You use vaginal douching. °· You became sexually active before 28 years of age. °SIGNS AND SYMPTOMS  °An ectopic pregnancy should be suspected in anyone who has missed a period  and has abdominal pain or bleeding. °· You may experience normal pregnancy symptoms, such as: °¨ Nausea. °¨ Tiredness. °¨ Breast tenderness. °· Other symptoms may include: °¨ Pain with intercourse. °¨ Irregular vaginal bleeding or spotting. °¨ Cramping or pain on one side or in the lower abdomen. °¨ Fast heartbeat. °¨ Passing out while having a bowel movement. °· Symptoms of a ruptured ectopic pregnancy and internal bleeding may include: °¨ Sudden, severe pain in the abdomen and pelvis. °¨ Dizziness or fainting. °¨ Pain in the shoulder area. °DIAGNOSIS  °Tests that may be performed include: °· A pregnancy test. °· An ultrasound test. °· Testing the specific level of pregnancy hormone in the bloodstream. °· Taking a sample of uterus tissue (dilation and curettage, D&C). °· Surgery to perform a visual exam of the inside of the abdomen using a thin, lighted tube with a tiny camera on the end (laparoscope). °TREATMENT  °An injection of a medicine called methotrexate may be given. This medicine causes the pregnancy tissue to be absorbed. It is given if: °· The diagnosis is made early. °· The fallopian tube has not ruptured. °· You are considered to be a good candidate for the medicine. °Usually, pregnancy hormone blood levels are checked after methotrexate treatment. This is to be sure the medicine is effective. It may take 4-6 weeks for the pregnancy to be absorbed (though most pregnancies will be absorbed by 3 weeks). °Surgical treatment may be needed. A laparoscope may be used to remove the pregnancy tissue. If severe internal bleeding occurs, a cut (incision) may be made in the lower abdomen (laparotomy), and the ectopic   pregnancy is removed. This stops the bleeding. Part of the fallopian tube, or the whole tube, may be removed as well (salpingectomy). After surgery, pregnancy hormone tests may be done to be sure there is no pregnancy tissue left. You may receive a Rho (D) immune globulin shot if you are Rh negative  and the father is Rh positive, or if you do not know the Rh type of the father. This is to prevent problems with any future pregnancy. °SEEK IMMEDIATE MEDICAL CARE IF:  °You have any symptoms of an ectopic pregnancy. This is a medical emergency. °MAKE SURE YOU: °· Understand these instructions. °· Will watch your condition. °· Will get help right away if you are not doing well or get worse. °Document Released: 02/03/2004 Document Revised: 05/12/2013 Document Reviewed: 07/25/2012 °[employer]® Patient Information ©2015 [employer]. This information is not intended to replace advice given to you by your health care provider. Make sure you discuss any questions you have with your health care provider. ° ° °

## 2014-04-27 NOTE — MAU Provider Note (Signed)
History     CSN: [id number]  Arrival date and time: 04/27/14 1607   None     Chief Complaint  Patient presents with  . Abdominal Pain  . Urinary Frequency   HPI Comments:  Sarah Castillo is 29 y.o. G1P0000 [redacted]w[redacted]d weeks sent by Dr. Quincy Simmonds for evaluation of LLQ pain in early pregnancy.  Pain began 3-4 days ago.  She had a "spot" of blood yesterday.  Was seen in office today, exam done there.  + UPT in the office and per record, urine culture was sent from there for report of urinary frequency.    Pelvic Pain The patient's primary symptoms include pelvic pain. The current episode started in the past 7 days. The pain is mild. The problem affects the left side. She is pregnant. Associated symptoms include abdominal pain, constipation and frequency. Pertinent negatives include no dysuria, fever, nausea, urgency or vomiting. The vaginal bleeding is spotting. Nothing aggravates the symptoms. She has tried nothing for the symptoms.    Past Medical History  Diagnosis Date  . Anxiety   . Depression   . PCOS (polycystic ovarian syndrome)   . IBS (irritable bowel syndrome)     Past Surgical History  Procedure Laterality Date  . Lumbar fusion  12/2012    L5S1  . Lumbar disc surgery  09/2007    L5S1  . Lasik  2012  . Colonoscopy w/ biopsies  01/19/09    normal  . Esophagogastroduodenoscopy  08/03/08    mild gastritis and HH    Family History  Problem Relation Age of Onset  . Colon cancer Cousin   . Diabetes Paternal Grandmother   . Diabetes Paternal Grandfather   . Diabetes Maternal Grandmother   . Diabetes Maternal Grandfather   . Heart attack Father   . Hypertension Mother   . Hypertension Father   . Thyroid disease Mother   . Hyperlipidemia Mother   . Hyperlipidemia Father     History  Substance Use Topics  . Smoking status: Never Smoker   . Smokeless tobacco: Never Used  . Alcohol Use: No    Allergies:  Allergies  Allergen Reactions  . Metformin And Related  Diarrhea    GI side eff   . Morphine Other (See Comments)    Other Reaction: FEVER, RASH  . Zoloft [Sertraline]     Prescriptions prior to admission  Medication Sig Dispense Refill Last Dose  . acetaminophen (TYLENOL) 500 MG tablet Take 500-1,000 mg by mouth daily.    04/26/2014 at Unknown time  . ARTIFICIAL TEAR OP Apply 1 drop to eye as needed (dry eyes).   04/26/2014 at Unknown time  . fluticasone (FLONASE) 50 MCG/ACT nasal spray Place 2 sprays into both nostrils daily as needed. (Patient taking differently: Place 2 sprays into both nostrils daily. ) 16 g 3 04/26/2014 at Unknown time  . loratadine (CLARITIN) 10 MG tablet Take 10 mg by mouth at bedtime.    04/26/2014 at Unknown time  . omeprazole (PRILOSEC) 20 MG capsule Take 1 capsule (20 mg total) by mouth 2 (two) times daily before a meal. 180 capsule 3 04/26/2014 at Unknown time  . Prenatal Vit-Fe Fumarate-FA (PRENATAL MULTIVITAMIN) TABS tablet Take 1 tablet by mouth daily at 12 noon.   04/26/2014 at Unknown time  . ergocalciferol (VITAMIN D2) 50000 UNITS capsule Take 1 capsule (50,000 Units total) by mouth once a week. 12 capsule 0 04/24/2014    Review of Systems  Constitutional: Negative for fever.  Gastrointestinal: Positive for abdominal pain and constipation. Negative for nausea and vomiting.  Genitourinary: Positive for frequency and pelvic pain. Negative for dysuria and urgency.       + for vaginal spotting yesterday.   Physical Exam   Blood pressure 121/67, pulse 87, temperature 98.1 F (36.7 C), temperature source Oral, resp. rate 20, height 5\' 4"  (1.626 m), weight 74.118 kg (163 lb 6.4 oz), last menstrual period 03/22/2014.  Physical Exam  Constitutional: She is oriented to person, place, and time. She appears well-developed and well-nourished. No distress.  HENT:  Head: Normocephalic.  Neck: Normal range of motion.  Cardiovascular: Normal rate.   Respiratory: Effort normal.  GI: Soft. There is no tenderness.   Genitourinary:  Pelvic exam done in office today.  Not repeated.  Neurological: She is alert and oriented to person, place, and time.  Skin: Skin is warm and dry.  Psychiatric: She has a normal mood and affect. Her behavior is normal.   Results for orders placed or performed during the hospital encounter of 04/27/14 (from the past 24 hour(s))  CBC with Differential/Platelet     Status: Abnormal   Collection Time: 04/27/14  6:35 PM  Result Value Ref Range   WBC 9.6 4.0 - 10.5 K/uL   RBC 4.79 3.87 - 5.11 MIL/uL   Hemoglobin 12.0 12.0 - 15.0 g/dL   HCT 35.8 (L) 36.0 - 46.0 %   MCV 74.7 (L) 78.0 - 100.0 fL   MCH 25.1 (L) 26.0 - 34.0 pg   MCHC 33.5 30.0 - 36.0 g/dL   RDW 17.3 (H) 11.5 - 15.5 %   Platelets 246 150 - 400 K/uL   Neutrophils Relative % 67 43 - 77 %   Neutro Abs 6.5 1.7 - 7.7 K/uL   Lymphocytes Relative 24 12 - 46 %   Lymphs Abs 2.3 0.7 - 4.0 K/uL   Monocytes Relative 6 3 - 12 %   Monocytes Absolute 0.6 0.1 - 1.0 K/uL   Eosinophils Relative 2 0 - 5 %   Eosinophils Absolute 0.2 0.0 - 0.7 K/uL   Basophils Relative 1 0 - 1 %   Basophils Absolute 0.1 0.0 - 0.1 K/uL  ABO/Rh     Status: None (Preliminary result)   Collection Time: 04/27/14  6:35 PM  Result Value Ref Range   ABO/RH(D) B POS   hCG, quantitative, pregnancy     Status: Abnormal   Collection Time: 04/27/14  6:35 PM  Result Value Ref Range   hCG, Beta Chain, Quant, S 9706 (H) <5 mIU/mL   US Ob Comp Less 14 Wks  04/27/2014   CLINICAL DATA:  29 year old pregnant female with pelvic pain. Beta HCG of 9,700. Estimated gestational age of [redacted] weeks 1 day by LMP.  EXAM: OBSTETRIC <14 WK [place] AND TRANSVAGINAL OB US  TECHNIQUE: Both transabdominal and transvaginal ultrasound examinations were performed for complete evaluation of the gestation as well as the maternal uterus, adnexal regions, and pelvic cul-de-sac. Transvaginal technique was performed to assess early pregnancy.  COMPARISON:  None.  FINDINGS: Intrauterine  gestational sac: Visualized/normal in shape.  Yolk sac:  Visualized  Embryo:  Not visualized  Cardiac Activity: Not visualized  MSD: 8.6  mm   5 w   4  d            [place] EDC: 12/24/2014  Maternal uterus/adnexae: A small subchorionic hemorrhage is noted.  The right ovary is unremarkable. The left ovary is not well visualized.  A trace amount  free fluid is noted. There is no evidence of adnexal mass.  IMPRESSION: Single intrauterine gestational sac containing yolk sac. Fetal pole is not identified at this time. Estimated gestational age by mean sac diameter is 5 weeks 4 days.  Small subchorionic hemorrhage.  Left ovary not visualized.   Electronically Signed   By: Margarette Canada M.D.   On: 04/27/2014 20:23   US Ob Transvaginal  04/27/2014   CLINICAL DATA:  29 year old pregnant female with pelvic pain. Beta HCG of 9,700. Estimated gestational age of [redacted] weeks 1 day by LMP.  EXAM: OBSTETRIC <14 WK [place] AND TRANSVAGINAL OB US  TECHNIQUE: Both transabdominal and transvaginal ultrasound examinations were performed for complete evaluation of the gestation as well as the maternal uterus, adnexal regions, and pelvic cul-de-sac. Transvaginal technique was performed to assess early pregnancy.  COMPARISON:  None.  FINDINGS: Intrauterine gestational sac: Visualized/normal in shape.  Yolk sac:  Visualized  Embryo:  Not visualized  Cardiac Activity: Not visualized  MSD: 8.6  mm   5 w   4  d            [place] EDC: 12/24/2014  Maternal uterus/adnexae: A small subchorionic hemorrhage is noted.  The right ovary is unremarkable. The left ovary is not well visualized.  A trace amount free fluid is noted. There is no evidence of adnexal mass.  IMPRESSION: Single intrauterine gestational sac containing yolk sac. Fetal pole is not identified at this time. Estimated gestational age by mean sac diameter is 5 weeks 4 days.  Small subchorionic hemorrhage.  Left ovary not visualized.   Electronically Signed   By: Margarette Canada M.D.   On: 04/27/2014 20:23     MAU Course  Procedures  MDM Dr. Quincy Simmonds to be called with U/S results. 20:00 Care turned over to H. Norman Herrlich, [place]  2037: D/W Dr. Quincy Simmonds and reviewed labs and [place] results. Review Ectopic precautions and pelvic rest, and she will have the office call to schedule repeat US in one week.   Assessment and Plan   1. Pelvic pain affecting pregnancy in first trimester, antepartum   2. Lower abdominal pain   3. Spotting affecting pregnancy in first trimester   4. Pregnancy of unknown anatomic location    DC home Ectopic precautions  Pelvic rest  Return to MAU as needed The office will call to schedule an appointment for repeat US   Follow-up Information    Follow up with Darcel Bayley, MD.   Specialty:  Obstetrics and Gynecology   Why:  They will call you with an appointment for repeat US    Contact information:   [address] [place] [address] [phone]        Mathis Bud 04/27/2014, 8:35 PM

## 2014-04-28 ENCOUNTER — Telehealth: Payer: Self-pay | Admitting: Emergency Medicine

## 2014-04-28 ENCOUNTER — Telehealth: Payer: Self-pay | Admitting: Obstetrics and Gynecology

## 2014-04-28 DIAGNOSIS — O26891 Other specified pregnancy related conditions, first trimester: Secondary | ICD-10-CM

## 2014-04-28 DIAGNOSIS — R102 Pelvic and perineal pain: Principal | ICD-10-CM

## 2014-04-28 LAB — URINE CULTURE
Colony Count: NO GROWTH
Organism ID, Bacteria: NO GROWTH

## 2014-04-28 LAB — ABO/RH: ABO/RH(D): B POS

## 2014-04-28 NOTE — Telephone Encounter (Signed)
Spoke with patient and message from Dr. Quincy Simmonds given.  Patient is agreeable to ultrasound and is scheduled for 05/07/14 at 0900.  Order placed for Pelvic ultrasound. Advised she would be contacted with benefits coverage information.  She states she has signed up for [facility] pregnancy coverage healthy pregnancy and would like to know if ultrasound can be used under those benefits. Husband is [employer] employee. Advised patient will need to discuss with Scientist, clinical (histocompatibility and immunogenetics), Gabriel Cirri. Patient agreeable.   Routing to provider for final review. Patient agreeable to disposition. Will close encounter

## 2014-04-28 NOTE — Telephone Encounter (Signed)
-----   Message from Nunzio Cobbs, MD sent at 04/28/2014 10:34 AM EDT ----- Regarding: needs pelvic ultrasound next week with me Please contact patient to schedule a follow up ultrasound for pregnancy viability.   She come in yesterday for positive UPT, LLQ pain, and spotting the night before.  I sent her to [facility] where she had a quant that was about 9000, ultrasound showing gestational sac and yolk sac but no fetal pole.  Measurements were consistent with 5+4 weeks.  By LMP, her dates are 5+2 weeks.  She needs an ultrasound next week for a recheck and I need to be able to counsel her at the same time.  I think Thursday is the best option, as I would expect to see a fetal pole by then.  She would be 6 + 5 weeks at that time.   She has received ectopic precautions as no fetal pole was identified at the hospital ultrasound and the left ovary was difficult to visualize.   Thank you,   Josefa Half

## 2014-04-28 NOTE — Telephone Encounter (Signed)
Spoke with patient's husband Sarah Castillo. Okay per ROI. Husband states that he would like to present for patient's ultrasound appointment and is only available in the afternoon as he is a physician for [facility]. Is requesting to move appointment to afternoon. Advised Dr.Silva performs ultrasound in office in the morning. Husband is requesting to have imaging at an outside facility and follow up appointment with Dr.Silva. "Also I have the one hundred dollar well baby plan with [facility] and your insurance department told my wife this appointment will not be applied to that since you are not an OB office. I do not want to pay out of pocket for this if I do not have to. Should we just switch to an OB provider?" Advised husband will speak with Dr.Silva regarding recommendations and return call. Husband is agreeable.

## 2014-04-28 NOTE — Telephone Encounter (Signed)
Pt's spouse is calling with questions for the nurse. dpr on file.

## 2014-04-28 NOTE — Telephone Encounter (Signed)
Call to patient. Advised of benefit quote received for PUS. Patient agreeable.

## 2014-04-28 NOTE — Telephone Encounter (Signed)
Left message to call [place] at [phone]. 

## 2014-04-29 NOTE — Telephone Encounter (Signed)
Spoke with patient's husband. Advised of message as seen below from [place]. Husband is agreeable and will call to schedule appointment with a [facility]. Will return call if they have any further questions. Will call once appointment is set to have records sent to [facility] office.  Routing to provider for final review. Patient agreeable to disposition. Will close encounter

## 2014-04-29 NOTE — Telephone Encounter (Signed)
I think it is very reasonable for the patient to switch to an OB office.  If they would like to stay in the [facility] system, they may want to choose a [facility] OB office such as the teaching service group who also see private insurance patients.

## 2014-05-07 ENCOUNTER — Other Ambulatory Visit: Payer: 59 | Admitting: Obstetrics and Gynecology

## 2014-05-07 ENCOUNTER — Other Ambulatory Visit: Payer: 59

## 2014-05-07 ENCOUNTER — Ambulatory Visit: Payer: 59 | Admitting: Psychology

## 2014-05-07 LAB — OB RESULTS CONSOLE ABO/RH: RH Type: POSITIVE

## 2014-06-11 LAB — OB RESULTS CONSOLE GBS: GBS: POSITIVE

## 2014-08-07 ENCOUNTER — Ambulatory Visit: Payer: 59 | Admitting: Gynecology

## 2014-09-15 ENCOUNTER — Telehealth: Payer: Self-pay | Admitting: Cardiovascular Disease

## 2014-09-15 NOTE — Telephone Encounter (Signed)
Received records from [facility] for appointment on 10/07/14 with Dr Oval Linsey.  Records given to [facility] (medical records) for Dr Blenda Mounts schedule on 10/07/14. lp

## 2014-10-07 ENCOUNTER — Encounter: Payer: Self-pay | Admitting: Cardiovascular Disease

## 2014-10-07 ENCOUNTER — Ambulatory Visit (INDEPENDENT_AMBULATORY_CARE_PROVIDER_SITE_OTHER): Payer: 59 | Admitting: Cardiovascular Disease

## 2014-10-07 ENCOUNTER — Ambulatory Visit (INDEPENDENT_AMBULATORY_CARE_PROVIDER_SITE_OTHER): Payer: 59

## 2014-10-07 VITALS — BP 128/80 | HR 109 | Ht 64.0 in | Wt 183.0 lb

## 2014-10-07 DIAGNOSIS — R000 Tachycardia, unspecified: Secondary | ICD-10-CM

## 2014-10-07 DIAGNOSIS — R002 Palpitations: Secondary | ICD-10-CM

## 2014-10-07 LAB — OB RESULTS CONSOLE HIV ANTIBODY (ROUTINE TESTING): HIV: NONREACTIVE

## 2014-10-07 LAB — OB RESULTS CONSOLE RPR: RPR: NONREACTIVE

## 2014-10-07 NOTE — Progress Notes (Signed)
Cardiology Office Note   Date:  10/07/2014   ID:  Sarah Castillo, DOB 19-Sep-1985, MRN [id number]  PCP:  Loura Pardon, MD  Cardiologist:   Sharol Harness, MD   Chief Complaint  Patient presents with  . New Evaluation  . Palpitations  . Dizziness  . Chest Pain    PRESSURE      History of Present Illness: Sarah Castillo is a 29 y.o. female with PCOS, anxiety who is [redacted] weeks pregnant and presents for an evaluation of palpitations.  She reports palpitations for the last 1-1/2-2 years. The symptoms have become more frequent and intense and she's been pregnant. She previously took Writer for anxiety. She stopped them after a short period because of symptoms. She does not think that they made any change in her symptoms.  The episodes occur almost daily. She reports heart racing, sweaty palms, feeling nervous and scared. The even wake her up from sleep at night. She endorses lightheadedness and chest pressure but denies syncope, lower extremity edema, orthopnea, or PND. Sometimes she feels anxious, but other times it occurs at rest when she is not particularly anxious. She denies exertional symptoms.  She denies cough, fever, chills, or dysuria. She had labs tested with her OB/GYN including blood counts, thyroid function, electrolytes, and urinalysis, that were reportedly within normal limits this week.  She reports drinking one black tea daily but denies heavy caffeine intake.  No family history of SCD.   Past Medical History  Diagnosis Date  . Anxiety   . Depression   . PCOS (polycystic ovarian syndrome)   . IBS (irritable bowel syndrome)     Past Surgical History  Procedure Laterality Date  . Lumbar fusion  12/2012    L5S1  . Lumbar disc surgery  09/2007    L5S1  . Lasik  2012  . Colonoscopy w/ biopsies  01/19/09    normal  . Esophagogastroduodenoscopy  08/03/08    mild gastritis and HH     Current Outpatient Prescriptions  Medication Sig  Dispense Refill  . acetaminophen (TYLENOL) 500 MG tablet Take 500-1,000 mg by mouth daily.     . ARTIFICIAL TEAR OP Apply 1 drop to eye as needed (dry eyes).    . Doxylamine-Pyridoxine (DICLEGIS PO) Take 10 mg by mouth at bedtime.    . ergocalciferol (VITAMIN D2) 50000 UNITS capsule Take 1 capsule (50,000 Units total) by mouth once a week. (Patient taking differently: Take 4,000 Units by mouth daily. ) 12 capsule 0  . fluticasone (FLONASE) 50 MCG/ACT nasal spray Place 2 sprays into both nostrils daily as needed. (Patient taking differently: Place 2 sprays into both nostrils daily. ) 16 g 3  . loratadine (CLARITIN) 10 MG tablet Take 10 mg by mouth at bedtime.     Marland Kitchen omeprazole (PRILOSEC) 20 MG capsule Take 1 capsule (20 mg total) by mouth 2 (two) times daily before a meal. (Patient taking differently: Take 20 mg by mouth daily. ) 180 capsule 3  . Prenatal Vit-Fe Fumarate-FA (PRENATAL MULTIVITAMIN) TABS tablet Take 1 tablet by mouth daily at 12 noon.     No current facility-administered medications for this visit.    Allergies:   Metformin and related; Morphine; and Zoloft    Social History:  The patient  reports that she has never smoked. She has never used smokeless tobacco. She reports that she does not drink alcohol or use illicit drugs.   Family History:  The patient's family  history includes Colon cancer in her cousin; Diabetes in her maternal grandfather, maternal grandmother, paternal grandfather, and paternal grandmother; Heart attack in her father; Hyperlipidemia in her father and mother; Hypertension in her father and mother; Thyroid disease in her mother.    ROS:  Please see the history of present illness.   Otherwise, review of systems are positive for none.   All other systems are reviewed and negative.    PHYSICAL EXAM: VS:  BP 128/80 mmHg  Pulse 109  Ht 5\' 4"  (1.626 m)  Wt 83.008 kg (183 lb)  BMI 31.40 kg/m2  LMP 03/22/2014 (Exact Date) , BMI Body mass index is 31.4  kg/(m^2). GENERAL:  Well appearing HEENT:  Pupils equal round and reactive, fundi not visualized, oral mucosa unremarkable NECK:  No jugular venous distention, waveform within normal limits, carotid upstroke brisk and symmetric, no bruits, no thyromegaly LYMPHATICS:  No cervical adenopathy LUNGS:  Clear to auscultation bilaterally HEART:  RRR.  PMI not displaced or sustained,S1 and S2 within normal limits, no S3, no S4, no clicks, no rubs, no murmurs ABD:  Flat, positive bowel sounds normal in frequency in pitch, no bruits, no rebound, no guarding, no midline pulsatile mass, no hepatomegaly, no splenomegaly.  Gravid uterus above umbilicus. EXT:  2 plus pulses throughout, no edema, no cyanosis no clubbing SKIN:  No rashes no nodules NEURO:  Cranial nerves II through XII grossly intact, motor grossly intact throughout PSYCH:  Cognitively intact, oriented to person place and time.  Appears anxious.    EKG:  EKG is ordered today. The ekg ordered today demonstrates sinus tachycardia.  Rate 109 bpm.   Recent Labs: 03/25/2014: ALT 31; BUN 10; Creatinine, Ser 0.79; Potassium 4.0; Sodium 137; TSH 1.72 04/27/2014: Hemoglobin 12.0; Platelets 246    Lipid Panel    Component Value Date/Time   CHOL 210* 08/05/2013 1615   TRIG 183* 08/05/2013 1615   HDL 45 08/05/2013 1615   CHOLHDL 4.7 08/05/2013 1615   VLDL 37 08/05/2013 1615   LDLCALC 128* 08/05/2013 1615      Wt Readings from Last 3 Encounters:  10/07/14 83.008 kg (183 lb)  04/27/14 74.118 kg (163 lb 6.4 oz)  04/27/14 74.844 kg (165 lb)      Other studies Reviewed: Additional studies/ records that were reviewed today include:  Review of the above records demonstrates:  Please see elsewhere in the note.     ASSESSMENT AND PLAN:  # Palpitations: Ms. Ng resting heart rate is 109 bpm, which may partially be due to normal physiologic changes with pregnancy.  However, it may be related to her anxiety or another underlying cause.  She is not currently reporting palpitations, which leads me to believe that sinus tachycardia alone is not the cause of her symptoms. We will obtain a 7 day event monitor to correlate her symptoms with the heart rhythm that is occurring at that time. Given that she has had normal labs with her OB/GYN recently, we will not repeat them at this time.  Given that she did not do well with Zoloft or BuSpar, we will consider a low-dose beta blocker if no malignant arrhythmias are found on her event monitor. Also recommend that she be seen by a psychiatrist or psychologist to assess her anxiety and see if it may be contributing to her symptoms.   Current medicines are reviewed at length with the patient today.  The patient does not have concerns regarding medicines.  The following changes have been made:  no change  Labs/ tests ordered today include:   Orders Placed This Encounter  Procedures  . Cardiac event monitor  . EKG 12-Lead     Disposition:   FU with Jazmon Kos C. Oval Linsey, MD as needed.   Signed, Sharol Harness, MD  10/07/2014 3:30 PM    [place]

## 2014-10-07 NOTE — Patient Instructions (Signed)
Your physician has recommended that you wear an event monitor. Event monitors are medical devices that record the heart's electrical activity. Doctors most often [place] these monitors to diagnose arrhythmias. Arrhythmias are problems with the speed or rhythm of the heartbeat. The monitor is a small, portable device. You can wear one while you do your normal daily activities. This is usually used to diagnose what is causing palpitations/syncope (passing out).  Dr Oval Linsey recommends that you follow-up with her as needed.  Your Doctor has ordered you to wear a heart monitor. You will wear this for 7 days.   TIPS -  REMINDERS 1. The sensor is the lanyard that is worn around your neck every day - this is powered by a battery that needs to be changed every day 2. The monitor is the device that allows you to record symptoms - this will need to be charged daily 3. The sensor & monitor need to be within 100 feet of each other at all times 4. The sensor connects to the electrodes (stickers) - these should be changed every 24-48 hours (you do not have to remove them when you bathe, just make sure they are dry when you connect it back to the sensor 5. If you need more supplies (electrodes, batteries), please call the 1-800 # on the back of the pamphlet and [employer] will mail you more supplies 6. If your skin becomes sensitive, please try the sample pack of sensitive skin electrodes (the white packet in your silver box) and call [employer] to have them mail you more of these type of electrodes 7. When you are finish wearing the monitor, please place all supplies back in the silver box, place the silver box in the pre-packaged UPS bag and drop off at [employer] or call them so they can come pick it up   Cardiac Event Monitoring A cardiac event monitor is a small recording device used to help detect abnormal heart rhythms (arrhythmias). The monitor is used to record heart rhythm when noticeable symptoms such as the  following occur:  Fast heartbeats (palpitations), such as heart racing or fluttering.  Dizziness.  Fainting or light-headedness.  Unexplained weakness. The monitor is wired to two electrodes placed on your chest. Electrodes are flat, sticky disks that attach to your skin. The monitor can be worn for up to 30 days. You will wear the monitor at all times, except when bathing.  HOW TO USE YOUR CARDIAC EVENT MONITOR A technician will prepare your chest for the electrode placement. The technician will show you how to place the electrodes, how to work the monitor, and how to replace the batteries. Take time to practice using the monitor before you leave the office. Make sure you understand how to send the information from the monitor to your health care Sarah Castillo. This requires a telephone with a landline, not a cell phone. You need to:  Wear your monitor at all times, except when you are in water:  Do not get the monitor wet.  Take the monitor off when bathing. Do not swim or use a hot tub with it on.  Keep your skin clean. Do not put body lotion or moisturizer on your chest.  Change the electrodes daily or any time they stop sticking to your skin. You might need to use tape to keep them on.  It is possible that your skin under the electrodes could become irritated. To keep this from happening, try to put the electrodes in slightly different places  on your chest. However, they must remain in the area under your left breast and in the upper right section of your chest.  Make sure the monitor is safely clipped to your clothing or in a location close to your body that your health care Sarah Castillo recommends.  Press the button to record when you feel symptoms of heart trouble, such as dizziness, weakness, light-headedness, palpitations, thumping, shortness of breath, unexplained weakness, or a fluttering or racing heart. The monitor is always on and records what happened slightly before you pressed the  button, so do not worry about being too late to get good information.  Keep a diary of your activities, such as walking, doing chores, and taking medicine. It is especially important to note what you were doing when you pushed the button to record your symptoms. This will help your health care Sarah Castillo determine what might be contributing to your symptoms. The information stored in your monitor will be reviewed by your health care Sarah Castillo alongside your diary entries.  Send the recorded information as recommended by your health care Sarah Castillo. It is important to understand that it will take some time for your health care Sarah Castillo to process the results.  Change the batteries as recommended by your health care Sarah Castillo. SEEK IMMEDIATE MEDICAL CARE IF:   You have chest pain.  You have extreme difficulty breathing or shortness of breath.  You develop a very fast heartbeat that persists.  You develop dizziness that does not go away.  You faint or constantly feel you are about to faint. Document Released: 10/05/2007 Document Revised: 05/12/2013 Document Reviewed: 06/24/2012 [facility] Patient Information 2015 [place], [place]. This information is not intended to replace advice given to you by your health care Sarah Castillo. Make sure you discuss any questions you have with your health care Sarah Castillo.

## 2014-10-22 ENCOUNTER — Telehealth: Payer: Self-pay | Admitting: *Deleted

## 2014-10-22 NOTE — Telephone Encounter (Signed)
-----   Message from Skeet Latch, MD sent at 10/20/2014 10:42 PM EDT ----- Monitor showed normal heart rhythms.  Her heart rate is slightly elevated, which is normal during pregnancy.  There were no dangerous or abnormal rhythms.  If she would like to follow up in clinic to see the rhythm strips, I'm happy to do so.  Otherwise, no need for follow up.

## 2014-10-22 NOTE — Telephone Encounter (Signed)
RESULT GIVEN TO HUSBAND. VERBALIZED UNDERSTANDING.

## 2014-10-22 NOTE — Telephone Encounter (Signed)
Left message to call back Release to my chart 

## 2014-10-28 ENCOUNTER — Encounter: Payer: 59 | Attending: Obstetrics and Gynecology

## 2014-10-28 VITALS — Ht 65.0 in | Wt 187.4 lb

## 2014-10-28 DIAGNOSIS — Z713 Dietary counseling and surveillance: Secondary | ICD-10-CM | POA: Diagnosis not present

## 2014-10-28 DIAGNOSIS — O24419 Gestational diabetes mellitus in pregnancy, unspecified control: Secondary | ICD-10-CM | POA: Insufficient documentation

## 2014-11-03 ENCOUNTER — Ambulatory Visit (HOSPITAL_COMMUNITY)
Admission: RE | Admit: 2014-11-03 | Discharge: 2014-11-03 | Disposition: A | Discharge status: Home / Self Care | Payer: 59 | Source: Ambulatory Visit | Attending: Obstetrics and Gynecology | Admitting: Obstetrics and Gynecology

## 2014-11-03 ENCOUNTER — Other Ambulatory Visit (HOSPITAL_COMMUNITY): Payer: Self-pay | Admitting: Obstetrics and Gynecology

## 2014-11-03 DIAGNOSIS — M7989 Other specified soft tissue disorders: Secondary | ICD-10-CM | POA: Diagnosis not present

## 2014-11-03 DIAGNOSIS — R609 Edema, unspecified: Secondary | ICD-10-CM

## 2014-11-03 DIAGNOSIS — Z331 Pregnant state, incidental: Secondary | ICD-10-CM | POA: Insufficient documentation

## 2014-11-03 NOTE — Progress Notes (Signed)
VASCULAR LAB PRELIMINARY  PRELIMINARY  PRELIMINARY  PRELIMINARY  Right lower extremity venous duplex completed.    Preliminary report:  Right:  No evidence of DVT, superficial thrombosis, or Baker's cyst.  Hence Derrick, RVT 11/03/2014, 6:21 PM

## 2014-11-04 NOTE — Progress Notes (Signed)
  Patient was seen on 10/28/14 for Gestational Diabetes self-management . The following learning objectives were met by the patient :   States the definition of Gestational Diabetes  States why dietary management is important in controlling blood glucose  Describes the effects of carbohydrates on blood glucose levels  Demonstrates ability to create a balanced meal plan  Demonstrates carbohydrate counting   States when to check blood glucose levels  Demonstrates proper blood glucose monitoring techniques  States the effect of stress and exercise on blood glucose levels  States the importance of limiting caffeine and abstaining from alcohol and smoking  Plan:  Aim for 2 Carb Choices per meal (30 grams) +/- 1 either way for breakfast Aim for 3 Carb Choices per meal (45 grams) +/- 1 either way from lunch and dinner Aim for 1-2 Carbs per snack Begin reading food labels for Total Carbohydrate and sugar grams of foods Consider  increasing your activity level by walking daily as tolerated Begin checking BG before breakfast and 1-2 hours after first bit of breakfast, lunch and dinner after  as directed by MD  Take medication  as directed by MD  Patient instructed to monitor glucose levels: FBS: 60 - <90 1 hour: <140 2 hour: <120  Patient received the following handouts:  Nutrition Diabetes and Pregnancy  Carbohydrate Counting List  Meal Planning worksheet  Patient will be seen for follow-up as needed.

## 2014-11-20 ENCOUNTER — Other Ambulatory Visit: Payer: Self-pay | Admitting: Family Medicine

## 2014-11-25 ENCOUNTER — Encounter (HOSPITAL_COMMUNITY)
Admission: RE | Admit: 2014-11-25 | Discharge: 2014-11-25 | Disposition: A | Discharge status: Home / Self Care | Payer: 59 | Source: Ambulatory Visit | Attending: Obstetrics and Gynecology | Admitting: Obstetrics and Gynecology

## 2014-11-30 ENCOUNTER — Telehealth (HOSPITAL_COMMUNITY): Payer: Self-pay | Admitting: *Deleted

## 2014-11-30 ENCOUNTER — Encounter (HOSPITAL_COMMUNITY): Payer: Self-pay | Admitting: *Deleted

## 2014-11-30 NOTE — Telephone Encounter (Signed)
Preadmission screen  

## 2014-12-07 ENCOUNTER — Inpatient Hospital Stay (HOSPITAL_COMMUNITY): Admission: RE | Admit: 2014-12-07 | Payer: 59 | Source: Ambulatory Visit

## 2014-12-07 NOTE — H&P (Signed)
Sarah Castillo is a 29 y.o. female, G1P0000 at 37.2 weeks, presenting version with epidural for complete breech presentation.       Patient Active Problem List   Diagnosis Date Noted  . Fatigue 03/25/2014  . Rash and nonspecific skin eruption 03/25/2014  . Nausea without vomiting 02/17/2014  . Breast pain 02/17/2014  . Myofascial pain 12/24/2013  . Tachycardia 12/24/2013  . PCOS (polycystic ovarian syndrome) 11/25/2013  . Adjustment disorder with mixed anxiety and depressed mood 11/25/2013  . Allergic rhinitis 11/25/2013  . GERD (gastroesophageal reflux disease) 11/25/2013  . IBS (irritable bowel syndrome) 11/25/2013  . Left knee pain 11/06/2013  . S/P lumbar fusion 11/06/2013    History of present pregnancy: Patient entered care at 6 weeks.   EDC of 12/28/14 was established by LMP/US.    Anatomy scan:  18.3 weeks, with normal findings and an anterior placenta.  Breech, No previa/ Placental edge to cx= 4.6cm. Placental cord insertion seen. Normal fluid. AP pocket= 4.7cm. Measurements are concordant with LMP GA. Anatomy: open hands, 5th digit, NB, AA seen. Female Gender. Anatomy not seen: RVOT, LVOT, DA, spine, ACI. Cx closed.  Additional [place] evaluations:    7 w3d ( Dating 05/13/14) : [place] IUP. Yolk Sac seen. Subchorionic collection= 1.0cm X 0.37cm X 0.85cm   [redacted]w[redacted]d (Anatomy F/u 08/27/14): normal interval growth. RVOT, LVOT, DA, ACI and spine seen today. Anatomy  complete.  EFW 1 lb 4oz , 562grams, 47%tile  09/24/14 NST reassurring  [redacted]w[redacted]d (OB follow up 11/17/14): EFW 2321 (5lbs2oz), 39.3%tile.  AFI 15.61.Lanae Boast breech  presentation, Anterior placenta, AFI is normal- 55th%.   [redacted]w[redacted]d (OB F/u 11/30/14):   Breech presentation, anterior placenta, fluid is normal, AFI= 25th%.  Significant prenatal events:  First Trimester:  Complains of various discomforts from nausea, vomiting, gas, tendonitis pain in elbow and wrist, along with concerns regarding discharge, spotting,  burning, bloating, constipation, shortness of breath, and occasional "hotness over her body" , along with body aches. Encouraged to increase fiber due to C/o anl and abdominal pain during bowel movements. Reports hx of IBS.    Second Trimester: Experienced a fall ay 15 wks, no abdominal trauma reported.  C/o headaches, Rx Flexeril. C/o daily cramping and tightening feeling of fingers. Unexplained proteinuria.  Continues to c/o left middle back pain, numbness both legs. Occ cramping, Constipation, Palpitations all day/night. and SOB, Nasal Congestion.  Referred to cardiologist to R/o cardiac issues (vs panic attacks).  C/o sciatica pain.   Third Trimester :   Breech presentation.  Cardiologist, WNL   Knee and calf pain, doppler WNL.   Last evaluation:  11/30/14  S.Rivard, MD :  FHT  134, BP 100/64  U/S for presentation - baby is complete breech with an AFI of 10.19 cm. Pt to be scheduled for version with epidural GBS positive previously pt states no problems today denies visit to  MAU.     OB History    Gravida Para Term Preterm AB TAB SAB Ectopic Multiple Living   1 0 0 0 0 0 0 0 0 0      Past Medical History  Diagnosis Date  . Anxiety   . Depression   . PCOS (polycystic ovarian syndrome)   . IBS (irritable bowel syndrome)   . Allergy   . Diabetes mellitus without complication ([place])   . Gestational diabetes mellitus (GDM), antepartum    Past Surgical History  Procedure Laterality Date  . Lumbar fusion  12/2012    L5S1  .  Lumbar disc surgery  09/2007    L5S1  . Lasik  2012  . Colonoscopy w/ biopsies  01/19/09    normal  . Esophagogastroduodenoscopy  08/03/08    mild gastritis and HH   Family History: family history includes Colon cancer in her cousin; Diabetes in her maternal grandfather, maternal grandmother, paternal grandfather, and paternal grandmother; Heart attack in her father; Hyperlipidemia in her father and mother; Hypertension in her father and mother; Thyroid disease in her  mother. Social History:  reports that she has never smoked. She has never used smokeless tobacco. She reports that she does not drink alcohol or use illicit drugs.  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  Her religion is Islam.  Husband has a history of inactive TB.    Prefers not to see female physicians.    Prenatal Transfer Tool  Maternal Diabetes: Yes:  Diabetes Type:  Diet controlled Genetic Screening: Normal - AFP, Panorama Maternal Ultrasounds/Referrals: Normal     Fetal Ultrasounds or other Referrals:  None Maternal Substance Abuse:  No Significant Maternal Medications:  Meds include: Other:  Diclegis, Flonase, Gas -X, Zofran, & Vitamin D3,  Significant Maternal Lab Results: Lab values include: Group B Strep positive  TDAP 10/21/14 Flu  10/07/14  ROS:  See above   Allergies  Allergen Reactions  . Metformin And Related Diarrhea    GI side eff   . Morphine Other (See Comments)    Other Reaction: FEVER, RASH  . Zoloft [Sertraline]        Last menstrual period 03/22/2014.  Chest clear Heart RRR without murmur Abd gravid, NT, FH 37 wks Pelvic: deferred Ext: wnl  FHR: 134 bpm on 11/30/14 UCs: none, occasional BH per pt chart  Prenatal labs: ABO, Rh: B/Positive/-- (04/28 0000) Antibody:   Rubella:  !Error!   immune RPR: Nonreactive (09/28 0000)  HBsAg:     Negative 12/02/14 HIV: Non-reactive (09/28 0000)  GBS: Positive (06/02 0000) Sickle cell/Hgb electrophoresis:  Normal, AA% Pap:  unknown GC:  unknown  Chlamydia:  unknown Genetic screenings:  Wnl, AFP, Panorama  Glucola: High :  173 3 GTT, 143 1hr GT,  Other:  TSH- normal Hgb 12.0  at NOB, 11.1 at 28 weeks   Assessment/Plan: IUP at 37.2 GBS positive Allergic rhinitis Gestational diabetes mellitus  Complete Breech presentation Vertigo Irritable bowel Syndrome- colitis Drug Allergy- morphine & Zoloft History of Depression History of Surgery- Lumbar  spinal fusion 2014, discectomy 2010  Strong familial history of diabetes    Plan: Admit to Short Stay for scheduled Version with Dr. Mancel Bale Epidural prior to Version Routine [facility] orders    Cherre Huger, [place] 12/07/2014, 10:53 AM

## 2014-12-08 ENCOUNTER — Other Ambulatory Visit: Payer: Self-pay | Admitting: Obstetrics and Gynecology

## 2014-12-09 ENCOUNTER — Inpatient Hospital Stay (HOSPITAL_COMMUNITY)
Admission: RE | Admit: 2014-12-09 | Discharge: 2014-12-09 | Disposition: A | Discharge status: Home / Self Care | Payer: 59 | Source: Ambulatory Visit | Attending: Obstetrics and Gynecology | Admitting: Obstetrics and Gynecology

## 2014-12-16 ENCOUNTER — Inpatient Hospital Stay (HOSPITAL_COMMUNITY): Payer: 59 | Admitting: Anesthesiology

## 2014-12-16 ENCOUNTER — Encounter (HOSPITAL_COMMUNITY)
Admission: AD | Disposition: A | Discharge status: Home / Self Care | Payer: Self-pay | Source: Ambulatory Visit | Attending: Obstetrics and Gynecology

## 2014-12-16 ENCOUNTER — Inpatient Hospital Stay (HOSPITAL_COMMUNITY): Payer: 59

## 2014-12-16 ENCOUNTER — Inpatient Hospital Stay (HOSPITAL_COMMUNITY)
Admission: AD | Admit: 2014-12-16 | Discharge: 2014-12-19 | DRG: 766 | Disposition: A | Discharge status: Home / Self Care | Payer: 59 | Source: Ambulatory Visit | Attending: Obstetrics and Gynecology | Admitting: Obstetrics and Gynecology

## 2014-12-16 ENCOUNTER — Encounter (HOSPITAL_COMMUNITY): Payer: Self-pay | Admitting: *Deleted

## 2014-12-16 DIAGNOSIS — Z98891 History of uterine scar from previous surgery: Secondary | ICD-10-CM

## 2014-12-16 DIAGNOSIS — D649 Anemia, unspecified: Secondary | ICD-10-CM | POA: Diagnosis present

## 2014-12-16 DIAGNOSIS — O321XX0 Maternal care for breech presentation, not applicable or unspecified: Secondary | ICD-10-CM | POA: Diagnosis present

## 2014-12-16 DIAGNOSIS — Z800 Family history of malignant neoplasm of digestive organs: Secondary | ICD-10-CM

## 2014-12-16 DIAGNOSIS — O9962 Diseases of the digestive system complicating childbirth: Secondary | ICD-10-CM | POA: Diagnosis present

## 2014-12-16 DIAGNOSIS — Z3A38 38 weeks gestation of pregnancy: Secondary | ICD-10-CM | POA: Diagnosis not present

## 2014-12-16 DIAGNOSIS — K219 Gastro-esophageal reflux disease without esophagitis: Secondary | ICD-10-CM | POA: Diagnosis present

## 2014-12-16 DIAGNOSIS — O24420 Gestational diabetes mellitus in childbirth, diet controlled: Secondary | ICD-10-CM | POA: Diagnosis present

## 2014-12-16 DIAGNOSIS — Z8249 Family history of ischemic heart disease and other diseases of the circulatory system: Secondary | ICD-10-CM

## 2014-12-16 DIAGNOSIS — O99824 Streptococcus B carrier state complicating childbirth: Secondary | ICD-10-CM | POA: Diagnosis present

## 2014-12-16 DIAGNOSIS — O9902 Anemia complicating childbirth: Secondary | ICD-10-CM | POA: Diagnosis present

## 2014-12-16 DIAGNOSIS — O26893 Other specified pregnancy related conditions, third trimester: Secondary | ICD-10-CM | POA: Diagnosis present

## 2014-12-16 DIAGNOSIS — O329XX0 Maternal care for malpresentation of fetus, unspecified, not applicable or unspecified: Secondary | ICD-10-CM

## 2014-12-16 LAB — CBC
HEMATOCRIT: 28.8 % — AB (ref 36.0–46.0)
HEMOGLOBIN: 9.3 g/dL — AB (ref 12.0–15.0)
MCH: 25 pg — ABNORMAL LOW (ref 26.0–34.0)
MCHC: 32.3 g/dL (ref 30.0–36.0)
MCV: 77.4 fL — ABNORMAL LOW (ref 78.0–100.0)
Platelets: 194 10*3/uL (ref 150–400)
RBC: 3.72 MIL/uL — AB (ref 3.87–5.11)
RDW: 15.6 % — ABNORMAL HIGH (ref 11.5–15.5)
WBC: 9.2 10*3/uL (ref 4.0–10.5)

## 2014-12-16 LAB — RAPID HIV SCREEN (HIV 1/2 AB+AG)
HIV 1/2 ANTIBODIES: NONREACTIVE
HIV-1 P24 Antigen - HIV24: NONREACTIVE

## 2014-12-16 LAB — HEPATITIS B SURFACE ANTIGEN: HEP B S AG: NEGATIVE

## 2014-12-16 LAB — TYPE AND SCREEN
ABO/RH(D): B POS
Antibody Screen: NEGATIVE

## 2014-12-16 LAB — POCT FERN TEST: POCT Fern Test: POSITIVE

## 2014-12-16 LAB — GLUCOSE, CAPILLARY: Glucose-Capillary: 85 mg/dL (ref 65–99)

## 2014-12-16 SURGERY — Surgical Case
Anesthesia: Monitor Anesthesia Care

## 2014-12-16 MED ORDER — SIMETHICONE 80 MG PO CHEW: 80.0000 mg | CHEWABLE_TABLET | ORAL | Status: DC | PRN

## 2014-12-16 MED ORDER — CITRIC ACID-SODIUM CITRATE 334-500 MG/5ML PO SOLN
30.0000 mL | Freq: Once | ORAL | Status: AC
  Administered 2014-12-16: 30 mL via ORAL
  Filled 2014-12-16: qty 15

## 2014-12-16 MED ORDER — NALOXONE HCL 0.4 MG/ML IJ SOLN: 0.4000 mg | INTRAMUSCULAR | Status: DC | PRN

## 2014-12-16 MED ORDER — MEPERIDINE HCL 25 MG/ML IJ SOLN: 6.2500 mg | INTRAMUSCULAR | Status: DC | PRN

## 2014-12-16 MED ORDER — DIPHENHYDRAMINE HCL 50 MG/ML IJ SOLN: 12.5000 mg | INTRAMUSCULAR | Status: DC | PRN

## 2014-12-16 MED ORDER — MORPHINE SULFATE (PF) 0.5 MG/ML IJ SOLN
INTRAMUSCULAR | Status: DC | PRN
  Administered 2014-12-16: .2 mg via INTRATHECAL

## 2014-12-16 MED ORDER — FENTANYL CITRATE (PF) 100 MCG/2ML IJ SOLN
INTRAMUSCULAR | Status: DC | PRN
  Administered 2014-12-16: 10 ug via INTRATHECAL
  Administered 2014-12-16: 50 ug via INTRAVENOUS
  Administered 2014-12-16: 40 ug via INTRAVENOUS

## 2014-12-16 MED ORDER — PHENYLEPHRINE 8 MG IN D5W 100 ML (0.08MG/ML) PREMIX OPTIME
INJECTION | INTRAVENOUS | Status: DC | PRN
  Administered 2014-12-16: 60 ug/min via INTRAVENOUS

## 2014-12-16 MED ORDER — HYDROMORPHONE HCL 1 MG/ML IJ SOLN: 0.2500 mg | INTRAMUSCULAR | Status: DC | PRN

## 2014-12-16 MED ORDER — CHLOROPROCAINE HCL (PF) 3 % IJ SOLN
INTRAMUSCULAR | Status: AC
  Filled 2014-12-16: qty 20

## 2014-12-16 MED ORDER — ACETAMINOPHEN 325 MG PO TABS
650.0000 mg | ORAL_TABLET | ORAL | Status: DC | PRN
  Administered 2014-12-17: 650 mg via ORAL
  Filled 2014-12-16: qty 2

## 2014-12-16 MED ORDER — SCOPOLAMINE 1 MG/3DAYS TD PT72
MEDICATED_PATCH | TRANSDERMAL | Status: DC | PRN
  Administered 2014-12-16: 1 via TRANSDERMAL

## 2014-12-16 MED ORDER — NALBUPHINE HCL 10 MG/ML IJ SOLN
5.0000 mg | INTRAMUSCULAR | Status: DC | PRN
  Filled 2014-12-16: qty 1

## 2014-12-16 MED ORDER — OXYTOCIN 10 UNIT/ML IJ SOLN
INTRAMUSCULAR | Status: AC
Start: 2014-12-16 — End: 2014-12-16
  Filled 2014-12-16: qty 4

## 2014-12-16 MED ORDER — IBUPROFEN 600 MG PO TABS
600.0000 mg | ORAL_TABLET | Freq: Four times a day (QID) | ORAL | Status: DC
  Administered 2014-12-16 – 2014-12-19 (×10): 600 mg via ORAL
  Filled 2014-12-16 (×11): qty 1

## 2014-12-16 MED ORDER — MEPERIDINE HCL 25 MG/ML IJ SOLN
INTRAMUSCULAR | Status: DC | PRN
  Administered 2014-12-16: 25 mg via INTRAVENOUS

## 2014-12-16 MED ORDER — SCOPOLAMINE 1 MG/3DAYS TD PT72: 1.0000 | MEDICATED_PATCH | Freq: Once | TRANSDERMAL | Status: DC

## 2014-12-16 MED ORDER — PRENATAL MULTIVITAMIN CH
1.0000 | ORAL_TABLET | Freq: Every day | ORAL | Status: DC
  Administered 2014-12-17 – 2014-12-19 (×3): 1 via ORAL
  Filled 2014-12-16 (×3): qty 1

## 2014-12-16 MED ORDER — LANOLIN HYDROUS EX OINT: 1.0000 "application " | TOPICAL_OINTMENT | CUTANEOUS | Status: DC | PRN

## 2014-12-16 MED ORDER — DIPHENHYDRAMINE HCL 25 MG PO CAPS: 25.0000 mg | ORAL_CAPSULE | Freq: Four times a day (QID) | ORAL | Status: DC | PRN

## 2014-12-16 MED ORDER — NALBUPHINE HCL 10 MG/ML IJ SOLN
5.0000 mg | Freq: Once | INTRAMUSCULAR | Status: AC | PRN
  Administered 2014-12-16: 5 mg via INTRAVENOUS

## 2014-12-16 MED ORDER — LACTATED RINGERS IV SOLN: INTRAVENOUS | Status: DC

## 2014-12-16 MED ORDER — SODIUM CHLORIDE 0.9 % IR SOLN
Status: DC | PRN
  Administered 2014-12-16: 1000 mL

## 2014-12-16 MED ORDER — ZOLPIDEM TARTRATE 5 MG PO TABS: 5.0000 mg | ORAL_TABLET | Freq: Every evening | ORAL | Status: DC | PRN

## 2014-12-16 MED ORDER — FERROUS SULFATE 325 (65 FE) MG PO TABS
325.0000 mg | ORAL_TABLET | Freq: Two times a day (BID) | ORAL | Status: DC
  Administered 2014-12-17: 325 mg via ORAL
  Filled 2014-12-16: qty 1

## 2014-12-16 MED ORDER — ONDANSETRON HCL 4 MG/2ML IJ SOLN: 4.0000 mg | Freq: Three times a day (TID) | INTRAMUSCULAR | Status: DC | PRN

## 2014-12-16 MED ORDER — IBUPROFEN 600 MG PO TABS
600.0000 mg | ORAL_TABLET | Freq: Four times a day (QID) | ORAL | Status: DC | PRN
  Administered 2014-12-18: 600 mg via ORAL

## 2014-12-16 MED ORDER — MENTHOL 3 MG MT LOZG: 1.0000 | LOZENGE | OROMUCOSAL | Status: DC | PRN

## 2014-12-16 MED ORDER — DIPHENHYDRAMINE HCL 50 MG/ML IJ SOLN
INTRAMUSCULAR | Status: DC | PRN
  Administered 2014-12-16 (×2): 25 mg via INTRAVENOUS

## 2014-12-16 MED ORDER — WITCH HAZEL-GLYCERIN EX PADS: 1.0000 "application " | MEDICATED_PAD | CUTANEOUS | Status: DC | PRN

## 2014-12-16 MED ORDER — DEXTROSE 5 % IV SOLN
1.0000 ug/kg/h | INTRAVENOUS | Status: DC | PRN
  Filled 2014-12-16: qty 2

## 2014-12-16 MED ORDER — KETOROLAC TROMETHAMINE 30 MG/ML IJ SOLN
INTRAMUSCULAR | Status: AC
  Filled 2014-12-16: qty 1

## 2014-12-16 MED ORDER — DIPHENHYDRAMINE HCL 50 MG/ML IJ SOLN
INTRAMUSCULAR | Status: AC
  Filled 2014-12-16: qty 2

## 2014-12-16 MED ORDER — BISACODYL 10 MG RE SUPP: 10.0000 mg | Freq: Every day | RECTAL | Status: DC | PRN

## 2014-12-16 MED ORDER — FAMOTIDINE IN NACL 20-0.9 MG/50ML-% IV SOLN
20.0000 mg | Freq: Once | INTRAVENOUS | Status: AC
  Administered 2014-12-16: 20 mg via INTRAVENOUS
  Filled 2014-12-16: qty 50

## 2014-12-16 MED ORDER — MORPHINE SULFATE (PF) 0.5 MG/ML IJ SOLN
INTRAMUSCULAR | Status: AC
  Filled 2014-12-16: qty 10

## 2014-12-16 MED ORDER — ONDANSETRON HCL 4 MG/2ML IJ SOLN
INTRAMUSCULAR | Status: AC
  Filled 2014-12-16: qty 2

## 2014-12-16 MED ORDER — BUPIVACAINE IN DEXTROSE 0.75-8.25 % IT SOLN
INTRATHECAL | Status: DC | PRN
  Administered 2014-12-16: 12 mg via INTRATHECAL

## 2014-12-16 MED ORDER — MEPERIDINE HCL 25 MG/ML IJ SOLN
INTRAMUSCULAR | Status: AC
  Filled 2014-12-16: qty 1

## 2014-12-16 MED ORDER — TETANUS-DIPHTH-ACELL PERTUSSIS 5-2.5-18.5 LF-MCG/0.5 IM SUSP
0.5000 mL | Freq: Once | INTRAMUSCULAR | Status: DC
Start: 2014-12-17 — End: 2014-12-19

## 2014-12-16 MED ORDER — SCOPOLAMINE 1 MG/3DAYS TD PT72
MEDICATED_PATCH | TRANSDERMAL | Status: AC
  Filled 2014-12-16: qty 1

## 2014-12-16 MED ORDER — KETOROLAC TROMETHAMINE 30 MG/ML IJ SOLN: 30.0000 mg | Freq: Four times a day (QID) | INTRAMUSCULAR | Status: AC | PRN

## 2014-12-16 MED ORDER — PROMETHAZINE HCL 25 MG/ML IJ SOLN: 6.2500 mg | INTRAMUSCULAR | Status: DC | PRN

## 2014-12-16 MED ORDER — NALBUPHINE HCL 10 MG/ML IJ SOLN: 5.0000 mg | INTRAMUSCULAR | Status: DC | PRN

## 2014-12-16 MED ORDER — LACTATED RINGERS IV SOLN
INTRAVENOUS | Status: DC
  Administered 2014-12-16 (×5): via INTRAVENOUS

## 2014-12-16 MED ORDER — OXYTOCIN 40 UNITS IN LACTATED RINGERS INFUSION - SIMPLE MED: 62.5000 mL/h | INTRAVENOUS | Status: AC

## 2014-12-16 MED ORDER — SENNOSIDES-DOCUSATE SODIUM 8.6-50 MG PO TABS
2.0000 | ORAL_TABLET | ORAL | Status: DC
Start: 2014-12-17 — End: 2014-12-19
  Administered 2014-12-17 – 2014-12-19 (×3): 2 via ORAL
  Filled 2014-12-16 (×5): qty 2

## 2014-12-16 MED ORDER — ONDANSETRON HCL 4 MG/2ML IJ SOLN
INTRAMUSCULAR | Status: DC | PRN
  Administered 2014-12-16: 4 mg via INTRAVENOUS

## 2014-12-16 MED ORDER — DIBUCAINE 1 % RE OINT: 1.0000 "application " | TOPICAL_OINTMENT | RECTAL | Status: DC | PRN

## 2014-12-16 MED ORDER — KETOROLAC TROMETHAMINE 30 MG/ML IJ SOLN
30.0000 mg | Freq: Four times a day (QID) | INTRAMUSCULAR | Status: AC | PRN
  Administered 2014-12-16: 30 mg via INTRAMUSCULAR

## 2014-12-16 MED ORDER — SODIUM CHLORIDE 0.9 % IJ SOLN: 3.0000 mL | INTRAMUSCULAR | Status: DC | PRN

## 2014-12-16 MED ORDER — CEFAZOLIN SODIUM-DEXTROSE 2-3 GM-% IV SOLR
2.0000 g | INTRAVENOUS | Status: AC
  Administered 2014-12-16: 2 g via INTRAVENOUS
  Filled 2014-12-16: qty 50

## 2014-12-16 MED ORDER — FENTANYL CITRATE (PF) 100 MCG/2ML IJ SOLN
100.0000 ug | Freq: Once | INTRAMUSCULAR | Status: AC
  Administered 2014-12-16: 100 ug via INTRAVENOUS
  Filled 2014-12-16: qty 2

## 2014-12-16 MED ORDER — ACETAMINOPHEN 500 MG PO TABS
1000.0000 mg | ORAL_TABLET | Freq: Four times a day (QID) | ORAL | Status: AC
  Administered 2014-12-17: 1000 mg via ORAL
  Filled 2014-12-16: qty 2

## 2014-12-16 MED ORDER — DIPHENHYDRAMINE HCL 25 MG PO CAPS
25.0000 mg | ORAL_CAPSULE | ORAL | Status: DC | PRN
  Filled 2014-12-16: qty 1

## 2014-12-16 MED ORDER — SIMETHICONE 80 MG PO CHEW
80.0000 mg | CHEWABLE_TABLET | Freq: Three times a day (TID) | ORAL | Status: DC
  Administered 2014-12-17 – 2014-12-19 (×7): 80 mg via ORAL
  Filled 2014-12-16 (×7): qty 1

## 2014-12-16 MED ORDER — FLEET ENEMA 7-19 GM/118ML RE ENEM: 1.0000 | ENEMA | Freq: Every day | RECTAL | Status: DC | PRN

## 2014-12-16 MED ORDER — MEASLES, MUMPS & RUBELLA VAC ~~LOC~~ INJ: 0.5000 mL | INJECTION | Freq: Once | SUBCUTANEOUS | Status: DC

## 2014-12-16 MED ORDER — FENTANYL CITRATE (PF) 100 MCG/2ML IJ SOLN
INTRAMUSCULAR | Status: AC
  Filled 2014-12-16: qty 2

## 2014-12-16 MED ORDER — NALBUPHINE HCL 10 MG/ML IJ SOLN: 5.0000 mg | Freq: Once | INTRAMUSCULAR | Status: AC | PRN

## 2014-12-16 MED ORDER — PHENYLEPHRINE 8 MG IN D5W 100 ML (0.08MG/ML) PREMIX OPTIME
INJECTION | INTRAVENOUS | Status: AC
  Filled 2014-12-16: qty 200

## 2014-12-16 MED ORDER — OXYTOCIN 10 UNIT/ML IJ SOLN
40.0000 [IU] | INTRAMUSCULAR | Status: DC | PRN
  Administered 2014-12-16: 40 [IU] via INTRAVENOUS

## 2014-12-16 MED ORDER — SIMETHICONE 80 MG PO CHEW
80.0000 mg | CHEWABLE_TABLET | ORAL | Status: DC
  Administered 2014-12-17: 80 mg via ORAL
  Filled 2014-12-16: qty 1

## 2014-12-16 SURGICAL SUPPLY — 37 items
BENZOIN TINCTURE PRP APPL 2/3 (GAUZE/BANDAGES/DRESSINGS) ×3 IMPLANT
CLAMP CORD UMBIL (MISCELLANEOUS) IMPLANT
CLOSURE WOUND 1/2 X4 (GAUZE/BANDAGES/DRESSINGS) ×1
CLOTH BEACON ORANGE TIMEOUT ST (SAFETY) ×3 IMPLANT
CONTAINER PREFILL 10% NBF 15ML (MISCELLANEOUS) IMPLANT
DRAIN JACKSON PRT FLT 10 (DRAIN) IMPLANT
DRAPE SHEET LG 3/4 BI-LAMINATE (DRAPES) IMPLANT
DRSG OPSITE POSTOP 4X10 (GAUZE/BANDAGES/DRESSINGS) ×3 IMPLANT
DURAPREP 26ML APPLICATOR (WOUND CARE) ×3 IMPLANT
ELECT REM PT RETURN 9FT ADLT (ELECTROSURGICAL) ×3
ELECTRODE REM PT RTRN 9FT ADLT (ELECTROSURGICAL) ×1 IMPLANT
EVACUATOR SILICONE 100CC (DRAIN) IMPLANT
EXTRACTOR VACUUM M CUP 4 TUBE (SUCTIONS) IMPLANT
EXTRACTOR VACUUM M CUP 4' TUBE (SUCTIONS)
GLOVE BIO SURGEON STRL SZ 6.5 (GLOVE) ×2 IMPLANT
GLOVE BIO SURGEONS STRL SZ 6.5 (GLOVE) ×1
GLOVE BIOGEL PI IND STRL 7.0 (GLOVE) ×2 IMPLANT
GLOVE BIOGEL PI INDICATOR 7.0 (GLOVE) ×4
GOWN STRL REUS W/TWL LRG LVL3 (GOWN DISPOSABLE) ×6 IMPLANT
KIT ABG SYR 3ML LUER SLIP (SYRINGE) IMPLANT
NEEDLE HYPO 25X5/8 SAFETYGLIDE (NEEDLE) IMPLANT
NS IRRIG 1000ML POUR BTL (IV SOLUTION) ×3 IMPLANT
PACK C SECTION WH (CUSTOM PROCEDURE TRAY) ×3 IMPLANT
PAD OB MATERNITY 4.3X12.25 (PERSONAL CARE ITEMS) ×3 IMPLANT
PENCIL SMOKE EVAC W/HOLSTER (ELECTROSURGICAL) ×3 IMPLANT
RTRCTR C-SECT PINK 25CM LRG (MISCELLANEOUS) IMPLANT
STRIP CLOSURE SKIN 1/2X4 (GAUZE/BANDAGES/DRESSINGS) ×2 IMPLANT
SUT CHROMIC 0 CT 1 (SUTURE) ×3 IMPLANT
SUT MNCRL AB 3-0 PS2 27 (SUTURE) ×3 IMPLANT
SUT PLAIN 2 0 (SUTURE) ×4
SUT PLAIN 2 0 XLH (SUTURE) ×3 IMPLANT
SUT PLAIN ABS 2-0 CT1 27XMFL (SUTURE) ×2 IMPLANT
SUT SILK 2 0 SH (SUTURE) IMPLANT
SUT VIC AB 0 CTX 36 (SUTURE) ×12
SUT VIC AB 0 CTX36XBRD ANBCTRL (SUTURE) ×6 IMPLANT
TOWEL OR 17X24 6PK STRL BLUE (TOWEL DISPOSABLE) ×3 IMPLANT
TRAY FOLEY CATH SILVER 14FR (SET/KITS/TRAYS/PACK) ×3 IMPLANT

## 2014-12-16 NOTE — Addendum Note (Signed)
Addendum  created 12/16/14 1607 by Asher Muir, CRNA   Modules edited: Clinical Notes   Clinical Notes:  File: [id number]

## 2014-12-16 NOTE — Progress Notes (Signed)
  Subjective: Awaiting C/S, scheduled at 9am due to ingestion at 1am.  Aware of some contractions as painful.  Husband and mother at bedside.  Objective: BP 114/77 mmHg  Pulse 108  Temp(Src) 98.3 F (36.8 C) (Oral)  Resp 18  Ht 5\' 4"  (1.626 m)  Wt 86.183 kg (190 lb)  BMI 32.60 kg/m2  LMP 03/22/2014 (Exact Date)      FHT: Category 1 UC:   irregular, every 3-6 minutes SVE:   Dilation: 1 Effacement (%): 50 Exam by:: Gavin Pound CNM Leaking clear fluid  Assessment:  IUP at 38 3/7 weeks Breech presentation SROM at 3:30am, clear fluid Latent labor GBS positive GDM Chronic back pain Anxiety/depression   Plan: Await C/S--Dr. Charlesetta Garibaldi will see patient and consent her. R&B of C/S reviewed with patient, including bleeding, infection, and damage to other organs.  Patient and husband seem to understand these risks and wish to proceed. Check CBG.  Donnel Saxon CNM 12/16/2014, 0730

## 2014-12-16 NOTE — Op Note (Signed)
Cesarean Section Procedure Note   [name]  12/16/2014  Indications: Breech Presentation and SROM   Pre-operative Diagnosis: breech, labor, SROM.   Post-operative Diagnosis: Same   Surgeon: Surgeon(s) and Role:    * Crawford Givens, MD - Primary   Assistants: V.Latham   Anesthesia: spinal   Procedure Details:  The patient was seen in the Holding Room. The risks, benefits, complications, treatment options, and expected outcomes were discussed with the patient. The patient concurred with the proposed plan, giving informed consent. identified as Sarah Castillo and the procedure verified as C-Section Delivery. A Time Out was held and the above information confirmed.  After induction of anesthesia, the patient was draped and prepped in the usual sterile manner. A transverse incision was made and carried down through the subcutaneous tissue to the fascia. Fascial incision was made in the midline and extended transversely. The fascia was separated from the underlying rectus muscle superiorly and inferiorly. The peritoneum was identified and entered. Peritoneal incision was extended longitudinally with good visualization of bowel and bladder. The utero-vesical peritoneal reflection was incised transversely and the bladder flap was bluntly freed from the lower uterine segment.  An alexsis retractor was placed in the abdomen.   A low transverse uterine incision was made. Delivered from breech frank presentation was a  infant, with Apgar scores of 5 at one minute and 8 at five minutes. Cord ph was venous 7.4.  unable to read arterial the umbilical cord was clamped and cut cord blood was obtained for evaluation. The placenta was removed Intact and appeared normal. The uterine outline, tubes and ovaries appeared normal}. The uterine incision was closed with running locked sutures of 0Vicryl. A second layer 0 vicrlyl was used to imbricate the uterine incision    Hemostasis was observed.  Lavage was carried out until clear. The alexsis was removed.  The peritoneum was closed with 0 chromic.  The muscles were examined and any bleeders were made hemostatic using bovie cautery device.   The fascia was then reapproximated with running sutures of 0 vicryl.  The subcutaneous tissue was reapproximated  With interrupted stitches using 2-0 plain gut. The subcuticular closure was performed using 3-46monocryl     Instrument, sponge, and needle counts were correct prior the abdominal closure and were correct at the conclusion of the case.    Findings: infant was delivered from frank breach presentation. The fluid was clear.  The uterus tubes and ovaries appeared normal.    the uterus had a small 1 cm fibroid Estimated Blood Loss: 700cc   Total IV Fluids: 207ml   Urine Output: 200CC OF clear urine  Specimens: placenta to pathology  Complications: no complications  Disposition: PACU - hemodynamically stable.   Maternal Condition: stable   Baby condition / location:  Couplet care / Skin to Skin  Attending Attestation: I performed the procedure.   Signed: Surgeon(s): Crawford Givens, MD

## 2014-12-16 NOTE — Anesthesia Postprocedure Evaluation (Signed)
Anesthesia Post Note  Patient: Sarah Castillo  Procedure(s) Performed: Procedure(s) (LRB): CESAREAN SECTION (N/A)  Patient location during evaluation: Mother Baby Anesthesia Type: Spinal Level of consciousness: awake Pain management: satisfactory to patient Vital Signs Assessment: post-procedure vital signs reviewed and stable Respiratory status: spontaneous breathing Cardiovascular status: stable Anesthetic complications: no    Last Vitals:  Filed Vitals:   12/16/14 1303 12/16/14 1400  BP: 107/68 112/80  Pulse: 80   Temp: 36.8 C 36.8 C  Resp: 18 18    Last Pain:  Filed Vitals:   12/16/14 1400  PainSc: 3                  Meagon Duskin

## 2014-12-16 NOTE — Lactation Note (Addendum)
This note was copied from the chart of Sarah Brocha Strahm. Lactation Consultation Note Initial visit at 9 hours of age.  Mom reports baby tried earlier but hasn't latched well and has been sleepy.  Mom is able to demonstrate hand expression with colostrum visible.  Baby asleep on mom STS.  LC assisted with waking technique and baby began to show feeding cues.  Assisted with football hold, baby latched well with strong rhythmic sucking.  Several re-latching attempts for mom to practice independent latching.  [facility] LC resources given and discussed.  Encouraged to feed with early cues on demand.  Early newborn behavior discussed.  DId not discuss PCOS at this visit, mom appears to have adequately developed breasts.   Mom to call for assist as needed.    Patient Name: Sarah Castillo Today's Date: 12/16/2014 Reason for consult: Initial assessment   Maternal Data Has patient been taught Hand Expression?: Yes Does the patient have breastfeeding experience prior to this delivery?: No  Feeding Feeding Type: Breast Fed Length of feed: 10 min  LATCH Score/Interventions Latch: Repeated attempts needed to sustain latch, nipple held in mouth throughout feeding, stimulation needed to elicit sucking reflex. Intervention(s): Adjust position;Assist with latch;Breast massage;Breast compression  Audible Swallowing: A few with stimulation Intervention(s): Skin to skin;Hand expression;Alternate breast massage  Type of Nipple: Everted at rest and after stimulation  Comfort (Breast/Nipple): Soft / non-tender     Hold (Positioning): Assistance needed to correctly position infant at breast and maintain latch. Intervention(s): Breastfeeding basics reviewed;Support Pillows;Position options;Skin to skin  LATCH Score: 7  Lactation Tools Discussed/Used WIC Program: No   Consult Status Consult Status: Follow-up Date: 12/17/14 Follow-up type: In-patient    [place], Justine Null 12/16/2014,  6:11 PM

## 2014-12-16 NOTE — H&P (Signed)
Sarah Castillo is a 29 y.o. female, G1P0000 at 38.3 weeks, presenting for possible SROM.  Patient reports gush of fluid around 0300.  Patient reports contractions started shortly after, but she is experiencing good fetal movement and denies VB.  Patient with known fetal malpresentation and positive GBS.   Patient Active Problem List   Diagnosis Date Noted  . Fatigue 03/25/2014  . Rash and nonspecific skin eruption 03/25/2014  . Nausea without vomiting 02/17/2014  . Breast pain 02/17/2014  . Myofascial pain 12/24/2013  . Tachycardia 12/24/2013  . PCOS (polycystic ovarian syndrome) 11/25/2013  . Adjustment disorder with mixed anxiety and depressed mood 11/25/2013  . Allergic rhinitis 11/25/2013  . GERD (gastroesophageal reflux disease) 11/25/2013  . IBS (irritable bowel syndrome) 11/25/2013  . Left knee pain 11/06/2013  . S/P lumbar fusion 11/06/2013    History of present pregnancy: Patient entered care at 6 weeks.   EDC of 12/28/14 was established by LMP/US.    Anatomy scan:  18.3 weeks, with normal findings and an anterior placenta.  Breech, No previa/ Placental edge to cx= 4.6cm. Placental cord insertion seen. Normal fluid. AP pocket= 4.7cm. Measurements are concordant with LMP GA. Anatomy: open hands, 5th digit, NB, AA seen. Female Gender. Anatomy not seen: RVOT, LVOT, DA, spine, ACI. Cx closed.  Additional [place] evaluations:    7 w3d ( Dating 05/13/14) : [place] IUP. Yolk Sac seen. Subchorionic collection= 1.0cm X 0.37cm X 0.85cm   [redacted]w[redacted]d (Anatomy F/u 08/27/14): normal interval growth. RVOT, LVOT, DA, ACI and spine seen today. Anatomy  complete.  EFW 1 lb 4oz , 562grams, 47%tile  09/24/14 NST reassurring  [redacted]w[redacted]d (OB follow up 11/17/14): EFW 2321 (5lbs2oz), 39.3%tile.  AFI 15.61.Lanae Boast breech  presentation, Anterior placenta, AFI is normal- 55th%.   [redacted]w[redacted]d (OB F/u 11/30/14):   Breech presentation, anterior placenta, fluid is normal, AFI= 25th%.  Significant prenatal  events:  First Trimester:  Complains of various discomforts from nausea, vomiting, gas, tendonitis pain in elbow and wrist, along with concerns regarding discharge, spotting, burning, bloating, constipation, shortness of breath, and occasional "hotness over her body" , along with body aches. Encouraged to increase fiber due to C/o anl and abdominal pain during bowel movements. Reports hx of IBS.    Second Trimester: Experienced a fall ay 15 wks, no abdominal trauma reported.  C/o headaches, Rx Flexeril. C/o daily cramping and tightening feeling of fingers. Unexplained proteinuria.  Continues to c/o left middle back pain, numbness both legs. Occ cramping, Constipation, Palpitations all day/night. and SOB, Nasal Congestion.  Referred to cardiologist to R/o cardiac issues (vs panic attacks).  C/o sciatica pain.    Third Trimester :   Breech presentation.  Cardiologist, WNL Knee and calf pain, doppler WNL. Patient scheduled for ECV, but cancelled the night prior to the procedure reporting fear.  Patient requests 39 wk primary c/s.   Last evaluation:  11/30/14  S.Rivard, MD :  FHT  134, BP 100/64  U/S for presentation - baby is complete breech with an AFI of 10.19 cm.    OB History    Gravida Para Term Preterm AB TAB SAB Ectopic Multiple Living   1 0 0 0 0 0 0 0 0 0      Past Medical History  Diagnosis Date  . Anxiety   . Depression   . PCOS (polycystic ovarian syndrome)   . IBS (irritable bowel syndrome)   . Allergy   . Diabetes mellitus without complication ([place])   . Gestational diabetes mellitus (  GDM), antepartum    Past Surgical History  Procedure Laterality Date  . Lumbar fusion  12/2012    L5S1  . Lumbar disc surgery  09/2007    L5S1  . Lasik  2012  . Colonoscopy w/ biopsies  01/19/09    normal  . Esophagogastroduodenoscopy  08/03/08    mild gastritis and HH   Family History: family history includes Colon cancer in her cousin; Diabetes in her maternal grandfather, maternal  grandmother, paternal grandfather, and paternal grandmother; Heart attack in her father; Hyperlipidemia in her father and mother; Hypertension in her father and mother; Thyroid disease in her mother. Social History:  reports that she has never smoked. She has never used smokeless tobacco. She reports that she does not drink alcohol or use illicit drugs.  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  Her religion is Islam.  Husband has a history of inactive TB.    Prefers not to see female physicians.    Prenatal Transfer Tool  Maternal Diabetes: Yes:  Diabetes Type:  Diet controlled Genetic Screening: Normal - AFP, Panorama Maternal Ultrasounds/Referrals: Normal     Fetal Ultrasounds or other Referrals:  None Maternal Substance Abuse:  No Significant Maternal Medications:  Meds include: Other:  Diclegis, Flonase, Gas -X, Zofran, & Vitamin D3,  Significant Maternal Lab Results: Lab values include: Group B Strep positive  TDAP 10/21/14 Flu  10/07/14  ROS:  See above   Allergies  Allergen Reactions  . Metformin And Related Diarrhea    GI side eff   . Zoloft [Sertraline] Diarrhea  . Morphine Rash and Other (See Comments)    Other Reaction: FEVER     Dilation: 1 Effacement (%): 50 Exam by:: Gavin Pound CNM Blood pressure 123/83, pulse 105, temperature 98.1 F (36.7 C), temperature source Oral, resp. rate 18, height 5\' 4"  (1.626 m), weight 86.183 kg (190 lb), last menstrual period 03/22/2014.  Chest clear Heart RRR without murmur Abd gravid, NT, FH 37 wks Ext: wnl  FHR: 145 bpm, Mod Var, -Decels, +Accels UCs: None graphed or palpated  Prenatal labs: ABO, Rh: B/Positive/-- (04/28 0000) Antibody:  Negative Rubella:  !Error!   Immune RPR: Nonreactive (09/28 0000)  HBsAg:     Negative 12/02/14 HIV: Non-reactive (09/28 0000)  GBS: Positive (06/02 0000) Sickle cell/Hgb electrophoresis:  Normal Pap:  unknown GC:  unknown  Chlamydia:   unknown Genetic screenings:  Wnl, AFP, Panorama  Glucola: High :  173 3 GTT, 143 1hr GT,  Other:  TSH- normal Hgb 12.0  at NOB, 11.1 at 28 weeks   Assessment IUP at 37.2 Cat I FT GDM-A1 Complete Breech  Unknown Rubella and HbSAG  Plan: Admit to hospital per consult with Dr. Sophronia Simas Routine [facility] admission orders  Will order rubella and HbSag Routine PreOp orders C/S scheduled for [place], MSN 12/16/2014, 6:15 AM   Addendum Patient last ate at 0100 Per anesthesia, c/s not emergent as patient not in active labor Time pushed back until 0900

## 2014-12-16 NOTE — Anesthesia Preprocedure Evaluation (Addendum)
Anesthesia Evaluation  Patient identified by MRN, date of birth, ID band Patient awake    Reviewed: Allergy & Precautions, H&P , NPO status , Patient's Chart, lab work & pertinent test results  Airway Mallampati: III  TM Distance: >3 FB Neck ROM: full    Dental  (+) Teeth Intact, Dental Advidsory Given   Pulmonary neg pulmonary ROS,    breath sounds clear to auscultation       Cardiovascular negative cardio ROS Normal cardiovascular exam     Neuro/Psych PSYCHIATRIC DISORDERS negative neurological ROS     GI/Hepatic Neg liver ROS, GERD  Medicated,  Endo/Other  diabetes  Renal/GU negative Renal ROS     Musculoskeletal   Abdominal   Peds  Hematology   Anesthesia Other Findings   Reproductive/Obstetrics (+) Pregnancy                          Anesthesia Physical Anesthesia Plan  ASA: III  Anesthesia Plan: MAC and Spinal   Post-op Pain Management:    Induction:   Airway Management Planned:   Additional Equipment:   Intra-op Plan:   Post-operative Plan:   Informed Consent: I have reviewed the patients History and Physical, chart, labs and discussed the procedure including the risks, benefits and alternatives for the proposed anesthesia with the patient or authorized representative who has indicated his/her understanding and acceptance.   Dental Advisory Given  Plan Discussed with: Anesthesiologist, CRNA and Surgeon  Anesthesia Plan Comments:        Anesthesia Quick Evaluation  Lab Results  Component Value Date   WBC 9.2 12/16/2014   HGB 9.3* 12/16/2014   HCT 28.8* 12/16/2014   MCV 77.4* 12/16/2014   PLT 194 12/16/2014

## 2014-12-16 NOTE — Transfer of Care (Signed)
Immediate Anesthesia Transfer of Care Note  Patient: Sarah Castillo  Procedure(s) Performed: Procedure(s): CESAREAN SECTION (N/A)  Patient Location: PACU  Anesthesia Type:Spinal  Level of Consciousness: awake, alert  and oriented  Airway & Oxygen Therapy: Patient Spontanous Breathing  Post-op Assessment: Report given to RN  Post vital signs: Reviewed  Last Vitals:  Filed Vitals:   12/16/14 0536 12/16/14 0742  BP: 123/83 114/77  Pulse: 105 108  Temp: 36.7 C 36.8 C  Resp: 18 18    Complications: No apparent anesthesia complications

## 2014-12-16 NOTE — MAU Note (Signed)
Contractions started at 3:30am possible ROM

## 2014-12-16 NOTE — Anesthesia Postprocedure Evaluation (Signed)
Anesthesia Post Note  Patient: Sarah Castillo  Procedure(s) Performed: Procedure(s) (LRB): CESAREAN SECTION (N/A)  Patient location during evaluation: PACU Anesthesia Type: Spinal Level of consciousness: oriented and awake and alert Pain management: pain level controlled Vital Signs Assessment: post-procedure vital signs reviewed and stable Respiratory status: spontaneous breathing, respiratory function stable and patient connected to nasal cannula oxygen Cardiovascular status: blood pressure returned to baseline and stable Postop Assessment: no headache and no backache Anesthetic complications: no    Last Vitals:  Filed Vitals:   12/16/14 1036 12/16/14 1044  BP:    Pulse: 93 89  Temp:    Resp: 18 19    Last Pain:  Filed Vitals:   12/16/14 1047  PainSc: Asleep                 Emerson Schreifels DANIEL

## 2014-12-16 NOTE — Anesthesia Procedure Notes (Signed)
Spinal Patient location during procedure: OR Staffing Anesthesiologist: Duane Boston Performed by: anesthesiologist  Preanesthetic Checklist Completed: patient identified, surgical consent, pre-op evaluation, timeout performed, IV checked, risks and benefits discussed and monitors and equipment checked Spinal Block Patient position: sitting Prep: DuraPrep Patient monitoring: cardiac monitor, continuous pulse ox and blood pressure Approach: midline Location: L2-3 Injection technique: single-shot Needle Needle type: Pencan  Needle gauge: 24 G Needle length: 9 cm Additional Notes Functioning IV was confirmed and monitors were applied. Sterile prep and drape, including hand hygiene and sterile gloves were used. The patient was positioned and the spine was prepped. The skin was anesthetized with lidocaine.  Free flow of clear CSF was obtained prior to injecting local anesthetic into the CSF.  The spinal needle aspirated freely following injection.  The needle was carefully withdrawn.  The patient tolerated the procedure well.

## 2014-12-17 ENCOUNTER — Encounter (HOSPITAL_COMMUNITY): Payer: Self-pay | Admitting: Obstetrics and Gynecology

## 2014-12-17 LAB — CBC
HCT: 25.2 % — ABNORMAL LOW (ref 36.0–46.0)
HEMOGLOBIN: 8.1 g/dL — AB (ref 12.0–15.0)
MCH: 25.1 pg — AB (ref 26.0–34.0)
MCHC: 32.1 g/dL (ref 30.0–36.0)
MCV: 78 fL (ref 78.0–100.0)
PLATELETS: 158 10*3/uL (ref 150–400)
RBC: 3.23 MIL/uL — AB (ref 3.87–5.11)
RDW: 15.6 % — ABNORMAL HIGH (ref 11.5–15.5)
WBC: 9.9 10*3/uL (ref 4.0–10.5)

## 2014-12-17 LAB — RPR: RPR: NONREACTIVE

## 2014-12-17 LAB — OB RESULTS CONSOLE RUBELLA ANTIBODY, IGM: RUBELLA: IMMUNE

## 2014-12-17 LAB — GLUCOSE, CAPILLARY: GLUCOSE-CAPILLARY: 101 mg/dL — AB (ref 65–99)

## 2014-12-17 MED ORDER — PANTOPRAZOLE SODIUM 40 MG PO TBEC
40.0000 mg | DELAYED_RELEASE_TABLET | Freq: Every day | ORAL | Status: DC
  Administered 2014-12-17 – 2014-12-19 (×3): 40 mg via ORAL
  Filled 2014-12-17 (×3): qty 1

## 2014-12-17 MED ORDER — CALCIUM CARBONATE ANTACID 500 MG PO CHEW
1.0000 | CHEWABLE_TABLET | Freq: Three times a day (TID) | ORAL | Status: DC
  Administered 2014-12-17 – 2014-12-19 (×5): 200 mg via ORAL
  Filled 2014-12-17 (×5): qty 1

## 2014-12-17 MED ORDER — OXYCODONE-ACETAMINOPHEN 5-325 MG PO TABS
1.0000 | ORAL_TABLET | ORAL | Status: DC | PRN
  Administered 2014-12-17 – 2014-12-19 (×8): 1 via ORAL
  Filled 2014-12-17 (×8): qty 1

## 2014-12-17 MED ORDER — DOCUSATE SODIUM 100 MG PO CAPS
100.0000 mg | ORAL_CAPSULE | Freq: Every day | ORAL | Status: DC
  Administered 2014-12-17 – 2014-12-19 (×3): 100 mg via ORAL
  Filled 2014-12-17 (×3): qty 1

## 2014-12-17 NOTE — Lactation Note (Signed)
This note was copied from the chart of Sarah Castillo. Lactation Consultation Note  Patient Name: Sarah Kaelani Broadstreet [id number] Date: 12/17/2014 Reason for consult: Follow-up assessment   With this mom of a term baby, now 10 hours old. Mom has not been able to get baby latched for 6 hours. Mom is in pain, and just had pain medicine. Mom was able to latch baby in football hold, with my help. The baby latched deeply with strong suckles, and good breast movement. Mom is concerned she will not be able to latch her independently. I told mom to call if she can not, and I will come back and watch her latch. Mom has easily expressed colostrum. Basic breast feeding teaching reviewed with mom and ada. Mom knows to call for questions/concerns.    Maternal Data    Feeding Feeding Type: Breast Fed  LATCH Score/Interventions Latch: Grasps breast easily, tongue down, lips flanged, rhythmical sucking. Intervention(s): Skin to skin;Teach feeding cues;Waking techniques Intervention(s): Adjust position;Assist with latch;Breast compression  Audible Swallowing: A few with stimulation Intervention(s): Hand expression;Skin to skin  Type of Nipple: Everted at rest and after stimulation (short shaft but evert)  Comfort (Breast/Nipple): Soft / non-tender     Hold (Positioning): Assistance needed to correctly position infant at breast and maintain latch. Intervention(s): Breastfeeding basics reviewed;Support Pillows;Position options;Skin to skin  LATCH Score: 8  Lactation Tools Discussed/Used     Consult Status Consult Status: Follow-up Date: 12/18/14 Follow-up type: In-patient    Tonna Corner 12/17/2014, 11:20 AM

## 2014-12-17 NOTE — Progress Notes (Signed)
Sarah Castillo [id number]  Subjective: Postpartum Day 1: Primary LTC/S due to breech presentation, SROM, early labor. Patient up ad lib, reports no syncope or dizziness.  Does report pain in incision and back with ambulation. Hx back surgery in recent years, with chronic back pain still. Requests Tums, reflux med, and abdominal binder. Feeding:  Breast Contraceptive plan:  Undecided.  Patient very concerned about constipation (hx of that s/p back surgery).  Objective: Temp:  [98.1 F (36.7 C)-99.3 F (37.4 C)] 98.1 F (36.7 C) (12/08 0930) Pulse Rate:  [75-90] 85 (12/08 0930) Resp:  [18-20] 18 (12/08 0930) BP: (91-112)/(49-80) 99/55 mmHg (12/08 0930) SpO2:  [97 %-100 %] 100 % (12/08 0930)   Orthostatics stable.  CBC Latest Ref Rng 12/17/2014 12/16/2014 04/27/2014  WBC 4.0 - 10.5 K/uL 9.9 9.2 9.6  Hemoglobin 12.0 - 15.0 g/dL 8.1(L) 9.3(L) 12.0  Hematocrit 36.0 - 46.0 % 25.2(L) 28.8(L) 35.8(L)  Platelets 150 - 400 K/uL 158 194 246     Physical Exam:  General: alert and moderately anxious Lochia: appropriate Uterine Fundus: firm Abdomen:  + bowel sounds Incision: Honeycomb dressing CDI DVT Evaluation: No evidence of DVT seen on physical exam. Negative Homan's sign.   Assessment/Plan: Status post cesarean delivery, day 1. Stable Chronic anemia, no hemodynamic instability Continue current care. Reviewed status with patient and husband, reassurances provided of normalcy of findings. Added Colace in am to nightly [place] Encouraged increasing fluids, fiber, and ambulation for digestive health. Rx Protonix (formulary equivalent to patient's previous omeprazole use), Tums. Abdominal binder per patient request. Fe rich foods--hx of severe constipation with Fe supplement in past.   Donnel Saxon MSN, CNM 12/17/2014, 11:09 AM

## 2014-12-17 NOTE — Lactation Note (Signed)
This note was copied from the chart of Sarah Castillo. Lactation Consultation Note  Patient Name: Sarah Castillo [facility] Date: 12/17/2014 Reason for consult: Follow-up assessment   With this first time mom and term baby. I met dad walking the baby, in the crib, in the hallway. The baby would cry whenever he stopped walking. I explained those were hunger cues. He said mom has not been able to latch the baby since I helped her. We went back to the room,  mom was on her side, resting, so I helped her latch the baby in side lying. The baby latched quickly and easily, with strong, rhythmic suckles. Mom said she will call for help with next feeding cues, if she can not get baby latched.    Maternal Data    Feeding Feeding Type: Breast Fed  LATCH Score/Interventions Latch: Grasps breast easily, tongue down, lips flanged, rhythmical sucking. Intervention(s): Skin to skin;Teach feeding cues;Waking techniques Intervention(s): Adjust position;Assist with latch;Breast compression  Audible Swallowing: A few with stimulation Intervention(s): Hand expression;Skin to skin  Type of Nipple: Everted at rest and after stimulation  Comfort (Breast/Nipple): Soft / non-tender     Hold (Positioning): Assistance needed to correctly position infant at breast and maintain latch. Intervention(s): Breastfeeding basics reviewed;Support Pillows;Position options;Skin to skin  LATCH Score: 8  Lactation Tools Discussed/Used     Consult Status Consult Status: Follow-up Date: 12/18/14 Follow-up type: In-patient    Sarah Castillo 12/17/2014, 1:37 PM

## 2014-12-17 NOTE — Lactation Note (Signed)
This note was copied from the chart of Sarah Castillo. Lactation Consultation Note Baby latch w/assistance. Baby starting to cluster feed at this time. Noted good latch . Mom pleased. Explained newborn behavior of cluster feeding, I&O, supply and demand. FOB at bedside and supportive. Informed on basic breastfeeding cues, and positioning. Baby has a swollen area to mid bottom gum. ?natal tooth or cyst? Baby clamps and bites during BF often. Suck training attempted and noticed area on gum line.  Patient Name: Sarah Lakiya Maccini [id number] Date: 12/17/2014 Reason for consult: Follow-up assessment   Maternal Data    Feeding Feeding Type: Breast Fed Length of feed: 15 min  LATCH Score/Interventions Latch: Too sleepy or reluctant, no latch achieved, no sucking elicited. Intervention(s): Skin to skin;Teach feeding cues;Waking techniques Intervention(s): Breast massage;Breast compression  Audible Swallowing: A few with stimulation Intervention(s): Skin to skin;Hand expression;Alternate breast massage  Type of Nipple: Everted at rest and after stimulation  Comfort (Breast/Nipple): Soft / non-tender     Hold (Positioning): Assistance needed to correctly position infant at breast and maintain latch. Intervention(s): Breastfeeding basics reviewed;Support Pillows;Position options;Skin to skin  LATCH Score: 7  Lactation Tools Discussed/Used Pump Review: Setup, frequency, and cleaning;Milk Storage Initiated by:: RN Date initiated:: 11/16/14   Consult Status Consult Status: Follow-up Date: 12/17/14 Follow-up type: In-patient    Brodie Correll, Elta [place] 12/17/2014, 3:03 AM

## 2014-12-17 NOTE — Progress Notes (Signed)
CSW attempted to meet with MOB on two separate occassions to complete psychosocial assessment due to history of anxiety and depression.  On both occassions, MOB was unable to engage with CSW.  CSW briefly introduced self and reason for the visit.  CSW to return on 12/9 in order to complete assessment.

## 2014-12-18 ENCOUNTER — Inpatient Hospital Stay (HOSPITAL_COMMUNITY)
Admission: RE | Admit: 2014-12-18 | Discharge: 2014-12-18 | Disposition: A | Discharge status: Home / Self Care | Payer: Self-pay | Source: Ambulatory Visit

## 2014-12-18 LAB — GLUCOSE, CAPILLARY: Glucose-Capillary: 110 mg/dL — ABNORMAL HIGH (ref 65–99)

## 2014-12-18 NOTE — Progress Notes (Signed)
CSW continues to put forth effort to complete assessment; however, MOB had numerous visitors in her room.  CSW will re-attempt at a later time.

## 2014-12-18 NOTE — Lactation Note (Signed)
This note was copied from the chart of Sarah Derinda Par[place]. Lactation Consultation Note  Patient Name: Sarah Castillo [id number] Date: 12/18/2014 Reason for consult: Follow-up assessment LC was f/u on latch. Mom was in the bathroom. FOB stated that mom had just fed the baby formula with the curved tip syring. There was a Medela DEBP in the box on the floor and FOB asked if LC would return later and show mom how to use the pump. Mom will call when she is ready to feed the baby or ready to pump. [place] left phone number on the board.   Maternal Data    Feeding    LATCH Score/Interventions                      Lactation Tools Discussed/Used     Consult Status Consult Status: Follow-up Date: 12/19/14 Follow-up type: In-patient    Denzil Hughes 12/18/2014, 6:31 PM

## 2014-12-18 NOTE — Lactation Note (Signed)
This note was copied from the chart of Sarah Castillo. Lactation Consultation Note  Patient Name: Sarah Laguisha Squier [id number] Date: 12/18/2014 Reason for consult: Follow-up assessment   Maternal Data    Feeding Feeding Type: Breast Fed Length of feed: 30 min  LATCH Score/Interventions Latch: Repeated attempts needed to sustain latch, nipple held in mouth throughout feeding, stimulation needed to elicit sucking reflex.  Audible Swallowing: A few with stimulation  Type of Nipple: Everted at rest and after stimulation  Comfort (Breast/Nipple): Soft / non-tender     Hold (Positioning): Assistance needed to correctly position infant at breast and maintain latch.  LATCH Score: 7  Lactation Tools Discussed/Used     Consult Status Consult Status: Follow-up Date: 12/19/14 Follow-up type: In-patient    Van Clines 12/18/2014, 1:53 PM

## 2014-12-18 NOTE — Lactation Note (Signed)
This note was copied from the chart of Sarah Keily Overbeck. Lactation Consultation Note  Patient Name: Sarah Castillo [id number] Date: 12/18/2014 Reason for consult: Follow-up assessment FOB requested help setting up the DEBP. Upon entry mom requested help latching the baby. Mom stated that her nipple are sore, showed mom how to help the baby flange her lips, and get a deep latch. Mom's R nipple appears red, no skin break noted. FOB had just picked up the [facility], showed parents how to set up the pump, mom was able to post pump about 10 ml from the L breast. Reviewed milk handling and storage. Answered questions about bottles and formula brands. Discussed baby behavior, feeding frequency, baby belly size, voids, wt loss, breast changes, and nipple care. Mom is aware of online resources, OP services, and support group. Mom will latch the baby on demand, 8+/24hr, post pump as needed, and call lactation with any concerns.    Maternal Data    Feeding Feeding Type: Breast Fed Length of feed: 30 min  LATCH Score/Interventions Latch: Repeated attempts needed to sustain latch, nipple held in mouth throughout feeding, stimulation needed to elicit sucking reflex. Intervention(s): Skin to skin Intervention(s): Adjust position;Breast compression  Audible Swallowing: Spontaneous and intermittent Intervention(s): Hand expression Intervention(s): Alternate breast massage  Type of Nipple: Everted at rest and after stimulation  Comfort (Breast/Nipple): Filling, red/small blisters or bruises, mild/mod discomfort  Problem noted: Mild/Moderate discomfort;Filling Interventions (Filling): Frequent nursing Interventions (Mild/moderate discomfort): Post-pump;Hand expression  Hold (Positioning): No assistance needed to correctly position infant at breast. Intervention(s): Position options;Support Pillows  LATCH Score: 8  Lactation Tools Discussed/Used Pump Review: Setup, frequency,  and cleaning;Milk Storage Initiated by:: ES Date initiated:: 12/18/14   Consult Status Consult Status: Follow-up Date: 12/19/14 Follow-up type: In-patient    Denzil Hughes 12/18/2014, 8:46 PM

## 2014-12-18 NOTE — Progress Notes (Signed)
Sarah Castillo [id number]  Subjective: Postpartum Day 2: Primary LTC/S due to breech, SROM, early labor. Patient up ad lib, reports no syncope or dizziness.  Multiple questions reviewed regarding breastfeeding and baby status. Feeding:  Working on breastfeeding, also supplementing Contraceptive plan:  Undecided at present  Objective: Temp:  [98.1 F (36.7 C)-98.6 F (37 C)] 98.6 F (37 C) (12/09 0514) Pulse Rate:  [85-100] 92 (12/09 0514) Resp:  [18] 18 (12/09 0514) BP: (99-121)/(55-65) 111/60 mmHg (12/09 0514) SpO2:  [100 %] 100 % (12/08 0930)  CBC Latest Ref Rng 12/17/2014 12/16/2014 04/27/2014  WBC 4.0 - 10.5 K/uL 9.9 9.2 9.6  Hemoglobin 12.0 - 15.0 g/dL 8.1(L) 9.3(L) 12.0  Hematocrit 36.0 - 46.0 % 25.2(L) 28.8(L) 35.8(L)  Platelets 150 - 400 K/uL 158 194 246     Physical Exam:  General: alert Lochia: appropriate Uterine Fundus: firm Abdomen:  + bowel sounds Incision: Honeycomb dressing CDI, small amount old drainage noted. DVT Evaluation: No evidence of DVT seen on physical exam. Negative Homan's sign.   Assessment/Plan: Status post cesarean delivery, day 2. Stable Continue current care. Plan for discharge tomorrow  Reassurance to patient regarding recovery status--baby questions referred to peds. Reviewed contraceptive options--patient will discuss wiith husband.   Donnel Saxon MSN, CNM 12/18/2014, 7:54 AM

## 2014-12-19 LAB — GLUCOSE, CAPILLARY: Glucose-Capillary: 83 mg/dL (ref 65–99)

## 2014-12-19 MED ORDER — OXYCODONE-ACETAMINOPHEN 5-325 MG PO TABS: 1.0000 | ORAL_TABLET | ORAL | Status: DC | PRN

## 2014-12-19 MED ORDER — FERROUS SULFATE 325 (65 FE) MG PO TABS: 325.0000 mg | ORAL_TABLET | Freq: Two times a day (BID) | ORAL | Status: DC

## 2014-12-19 MED ORDER — IBUPROFEN 600 MG PO TABS: 600.0000 mg | ORAL_TABLET | Freq: Four times a day (QID) | ORAL | Status: AC | PRN

## 2014-12-19 NOTE — Discharge Instructions (Signed)
Postpartum Depression and Baby Blues The postpartum period begins right after the birth of a baby. During this time, there is often a great amount of joy and excitement. It is also a time of many changes in the life of the parents. Regardless of how many times a mother gives birth, each child brings new challenges and dynamics to the family. It is not unusual to have feelings of excitement along with confusing shifts in moods, emotions, and thoughts. All mothers are at risk of developing postpartum depression or the "baby blues." These mood changes can occur right after giving birth, or they may occur many months after giving birth. The baby blues or postpartum depression can be mild or severe. Additionally, postpartum depression can go away rather quickly, or it can be a long-term condition.  CAUSES Raised hormone levels and the rapid drop in those levels are thought to be a main cause of postpartum depression and the baby blues. A number of hormones change during and after pregnancy. Estrogen and progesterone usually decrease right after the delivery of your baby. The levels of thyroid hormone and various cortisol steroids also rapidly drop. Other factors that play a role in these mood changes include major life events and genetics.  RISK FACTORS If you have any of the following risks for the baby blues or postpartum depression, know what symptoms to watch out for during the postpartum period. Risk factors that may increase the likelihood of getting the baby blues or postpartum depression include:  Having a personal or family history of depression.   Having depression while being pregnant.   Having premenstrual mood issues or mood issues related to oral contraceptives.  Having a lot of life stress.   Having marital conflict.   Lacking a social support network.   Having a baby with special needs.   Having health problems, such as diabetes.  SIGNS AND SYMPTOMS Symptoms of baby blues  include:  Brief changes in mood, such as going from extreme happiness to sadness.  Decreased concentration.   Difficulty sleeping.   Crying spells, tearfulness.   Irritability.   Anxiety.  Symptoms of postpartum depression typically begin within the first month after giving birth. These symptoms include:  Difficulty sleeping or excessive sleepiness.   Marked weight loss.   Agitation.   Feelings of worthlessness.   Lack of interest in activity or food.  Postpartum psychosis is a very serious condition and can be dangerous. Fortunately, it is rare. Displaying any of the following symptoms is cause for immediate medical attention. Symptoms of postpartum psychosis include:   Hallucinations and delusions.   Bizarre or disorganized behavior.   Confusion or disorientation.  DIAGNOSIS  A diagnosis is made by an evaluation of your symptoms. There are no medical or lab tests that lead to a diagnosis, but there are various questionnaires that a health care provider may use to identify those with the baby blues, postpartum depression, or psychosis. Often, a screening tool called the [place] Postnatal Depression Scale is used to diagnose depression in the postpartum period.  TREATMENT The baby blues usually goes away on its own in 1-2 weeks. Social support is often all that is needed. You will be encouraged to get adequate sleep and rest. Occasionally, you may be given medicines to help you sleep.  Postpartum depression requires treatment because it can last several months or longer if it is not treated. Treatment may include individual or group therapy, medicine, or both to address any social, physiological, and psychological  factors that may play a role in the depression. Regular exercise, a healthy diet, rest, and social support may also be strongly recommended.  Postpartum psychosis is more serious and needs treatment right away. Hospitalization is often needed. HOME CARE  INSTRUCTIONS  Get as much rest as you can. Nap when the baby sleeps.   Exercise regularly. Some women find yoga and walking to be beneficial.   Eat a balanced and nourishing diet.   Do little things that you enjoy. Have a cup of tea, take a bubble bath, read your favorite magazine, or listen to your favorite music.  Avoid alcohol.   Ask for help with household chores, cooking, grocery shopping, or running errands as needed. Do not try to do everything.   Talk to people close to you about how you are feeling. Get support from your partner, family members, friends, or other new moms.  Try to stay positive in how you think. Think about the things you are grateful for.   Do not spend a lot of time alone.   Only take over-the-counter or prescription medicine as directed by your health care provider.  Keep all your postpartum appointments.   Let your health care provider know if you have any concerns.  SEEK MEDICAL CARE IF: You are having a reaction to or problems with your medicine. SEEK IMMEDIATE MEDICAL CARE IF:  You have suicidal feelings.   You think you may harm the baby or someone else. MAKE SURE YOU:  Understand these instructions.  Will watch your condition.  Will get help right away if you are not doing well or get worse.   This information is not intended to replace advice given to you by your health care provider. Make sure you discuss any questions you have with your health care provider.   Document Released: 09/30/2003 Document Revised: 12/31/2012 Document Reviewed: 10/07/2012 Elsevier Interactive Patient Education [employer]. Contraception Choices Contraception (birth control) is the use of any methods or devices to prevent pregnancy. Below are some methods to help avoid pregnancy. HORMONAL METHODS   Contraceptive implant. This is a thin, plastic tube containing progesterone hormone. It does not contain estrogen hormone. Your health care  provider inserts the tube in the inner part of the upper arm. The tube can remain in place for up to 3 years. After 3 years, the implant must be removed. The implant prevents the ovaries from releasing an egg (ovulation), thickens the cervical mucus to prevent sperm from entering the uterus, and thins the lining of the inside of the uterus.  Progesterone-only injections. These injections are given every 3 months by your health care provider to prevent pregnancy. This synthetic progesterone hormone stops the ovaries from releasing eggs. It also thickens cervical mucus and changes the uterine lining. This makes it harder for sperm to survive in the uterus.  Birth control pills. These pills contain estrogen and progesterone hormone. They work by preventing the ovaries from releasing eggs (ovulation). They also cause the cervical mucus to thicken, preventing the sperm from entering the uterus. Birth control pills are prescribed by a health care provider.Birth control pills can also be used to treat heavy periods.  Minipill. This type of birth control pill contains only the progesterone hormone. They are taken every day of each month and must be prescribed by your health care provider.  Birth control patch. The patch contains hormones similar to those in birth control pills. It must be changed once a week and is prescribed by  a health care provider.  Vaginal ring. The ring contains hormones similar to those in birth control pills. It is left in the vagina for 3 weeks, removed for 1 week, and then a new one is put back in place. The patient must be comfortable inserting and removing the ring from the vagina.A health care provider's prescription is necessary.  Emergency contraception. Emergency contraceptives prevent pregnancy after unprotected sexual intercourse. This pill can be taken right after sex or up to 5 days after unprotected sex. It is most effective the sooner you take the pills after having  sexual intercourse. Most emergency contraceptive pills are available without a prescription. Check with your pharmacist. Do not use emergency contraception as your only form of birth control. BARRIER METHODS   Female condom. This is a thin sheath (latex or rubber) that is worn over the penis during sexual intercourse. It can be used with spermicide to increase effectiveness.  Female condom. This is a soft, loose-fitting sheath that is put into the vagina before sexual intercourse.  Diaphragm. This is a soft, latex, dome-shaped barrier that must be fitted by a health care provider. It is inserted into the vagina, along with a spermicidal jelly. It is inserted before intercourse. The diaphragm should be left in the vagina for 6 to 8 hours after intercourse.  Cervical cap. This is a round, soft, latex or plastic cup that fits over the cervix and must be fitted by a health care provider. The cap can be left in place for up to 48 hours after intercourse.  Sponge. This is a soft, circular piece of polyurethane foam. The sponge has spermicide in it. It is inserted into the vagina after wetting it and before sexual intercourse.  Spermicides. These are chemicals that kill or block sperm from entering the cervix and uterus. They come in the form of creams, jellies, suppositories, foam, or tablets. They do not require a prescription. They are inserted into the vagina with an applicator before having sexual intercourse. The process must be repeated every time you have sexual intercourse. INTRAUTERINE CONTRACEPTION  Intrauterine device (IUD). This is a T-shaped device that is put in a woman's uterus during a menstrual period to prevent pregnancy. There are 2 types:  Copper IUD. This type of IUD is wrapped in copper wire and is placed inside the uterus. Copper makes the uterus and fallopian tubes produce a fluid that kills sperm. It can stay in place for 10 years.  Hormone IUD. This type of IUD contains the  hormone progestin (synthetic progesterone). The hormone thickens the cervical mucus and prevents sperm from entering the uterus, and it also thins the uterine lining to prevent implantation of a fertilized egg. The hormone can weaken or kill the sperm that get into the uterus. It can stay in place for 3-5 years, depending on which type of IUD is used. PERMANENT METHODS OF CONTRACEPTION  Female tubal ligation. This is when the woman's fallopian tubes are surgically sealed, tied, or blocked to prevent the egg from traveling to the uterus.  Hysteroscopic sterilization. This involves placing a small coil or insert into each fallopian tube. Your doctor uses a technique called hysteroscopy to do the procedure. The device causes scar tissue to form. This results in permanent blockage of the fallopian tubes, so the sperm cannot fertilize the egg. It takes about 3 months after the procedure for the tubes to become blocked. You must use another form of birth control for these 3 months.  Female sterilization.  This is when the female has the tubes that carry sperm tied off (vasectomy).This blocks sperm from entering the vagina during sexual intercourse. After the procedure, the man can still ejaculate fluid (semen). NATURAL PLANNING METHODS  Natural family planning. This is not having sexual intercourse or using a barrier method (condom, diaphragm, cervical cap) on days the woman could become pregnant.  Calendar method. This is keeping track of the length of each menstrual cycle and identifying when you are fertile.  Ovulation method. This is avoiding sexual intercourse during ovulation.  Symptothermal method. This is avoiding sexual intercourse during ovulation, using a thermometer and ovulation symptoms.  Post-ovulation method. This is timing sexual intercourse after you have ovulated. Regardless of which type or method of contraception you choose, it is important that you use condoms to protect against the  transmission of sexually transmitted infections (STIs). Talk with your health care provider about which form of contraception is most appropriate for you.   This information is not intended to replace advice given to you by your health care provider. Make sure you discuss any questions you have with your health care provider.   Document Released: 12/26/2004 Document Revised: 12/31/2012 Document Reviewed: 06/20/2012 Elsevier Interactive Patient Education 2016 [employer]. Iron-Rich Diet Iron is a mineral that helps your body to produce hemoglobin. Hemoglobin is a protein in your red blood cells that carries oxygen to your body's tissues. Eating too little iron may cause you to feel weak and tired, and it can increase your risk for infection. Eating enough iron is necessary for your body's metabolism, muscle function, and nervous system. Iron is naturally found in many foods. It can also be added to foods or fortified in foods. There are two types of dietary iron:  Heme iron. Heme iron is absorbed by the body more easily than nonheme iron. Heme iron is found in meat, poultry, and fish.  Nonheme iron. Nonheme iron is found in dietary supplements, iron-fortified grains, beans, and vegetables. You may need to follow an iron-rich diet if:  You have been diagnosed with iron deficiency or iron-deficiency anemia.  You have a condition that prevents you from absorbing dietary iron, such as:  Infection in your intestines.  Celiac disease. This involves long-lasting (chronic) inflammation of your intestines.  You do not eat enough iron.  You eat a diet that is high in foods that impair iron absorption.  You have lost a lot of blood.  You have heavy bleeding during your menstrual cycle.  You are pregnant. WHAT IS MY PLAN? Your health care provider may help you to determine how much iron you need per day based on your condition. Generally, when a person consumes sufficient amounts of iron in the  diet, the following iron needs are met:  Men.  49-6 years old: 11 mg per day.  41-73 years old: 8 mg per day.  Women.   24-59 years old: 15 mg per day.  69-76 years old: 18 mg per day.  Over 53 years old: 8 mg per day.  Pregnant women: 27 mg per day.  Breastfeeding women: 9 mg per day. WHAT DO I NEED TO KNOW ABOUT AN IRON-RICH DIET?  Eat fresh fruits and vegetables that are high in vitamin C along with foods that are high in iron. This will help increase the amount of iron that your body absorbs from food, especially with foods containing nonheme iron. Foods that are high in vitamin C include oranges, peppers, tomatoes, and mango.  Take  iron supplements only as directed by your health care provider. Overdose of iron can be life-threatening. If you were prescribed iron supplements, take them with orange juice or a vitamin C supplement.  Cook foods in pots and pans that are made from iron.   Eat nonheme iron-containing foods alongside foods that are high in heme iron. This helps to improve your iron absorption.   Certain foods and drinks contain compounds that impair iron absorption. Avoid eating these foods in the same meal as iron-rich foods or with iron supplements. These include:  Coffee, black tea, and red wine.  Milk, dairy products, and foods that are high in calcium.  Beans, soybeans, and peas.  Whole grains.  When eating foods that contain both nonheme iron and compounds that impair iron absorption, follow these tips to absorb iron better.   Soak beans overnight before cooking.  Soak whole grains overnight and drain them before using.  Ferment flours before baking, such as using yeast in bread dough. WHAT FOODS CAN I EAT? Grains Iron-fortified breakfast cereal. Iron-fortified whole-wheat bread. Enriched rice. Sprouted grains. Vegetables Spinach. Potatoes with skin. Green peas. Broccoli. Red and green bell peppers. Fermented vegetables. Fruits Prunes.  Raisins. Oranges. Strawberries. Mango. Grapefruit. Meats and Other Protein Sources Beef liver. Oysters. Beef. Shrimp. [place]. Chicken. [place]. Sardines. Chickpeas. Nuts. Tofu. Beverages Tomato juice. Fresh orange juice. Prune juice. Hibiscus tea. Fortified instant breakfast shakes. Condiments Tahini. Fermented soy sauce. Sweets and Desserts Black-strap molasses.  Other Wheat germ. The items listed above may not be a complete list of recommended foods or beverages. Contact your dietitian for more options. WHAT FOODS ARE NOT RECOMMENDED? Grains Whole grains. Bran cereal. Bran flour. Oats. Vegetables Artichokes. Brussels sprouts. Kale. Fruits Blueberries. Raspberries. Strawberries. Figs. Meats and Other Protein Sources Soybeans. Products made from soy protein. Dairy Milk. Cream. Cheese. Yogurt. Cottage cheese. Beverages Coffee. Black tea. Red wine. Sweets and Desserts Cocoa. Chocolate. Ice cream. Other Basil. Oregano. Parsley. The items listed above may not be a complete list of foods and beverages to avoid. Contact your dietitian for more information.   This information is not intended to replace advice given to you by your health care provider. Make sure you discuss any questions you have with your health care provider.   Document Released: 08/09/2004 Document Revised: 01/16/2014 Document Reviewed: 07/23/2013 Elsevier Interactive Patient Education [employer].

## 2014-12-19 NOTE — Lactation Note (Signed)
This note was copied from the chart of Sarah Castillo. Lactation Consultation Note  Patient Name: Sarah Castillo [id number] Date: 12/19/2014 Reason for consult: Follow-up assessment;Other (Comment) (6% weight loss, Bilicheck - at 62 hours - 8.6 )  Baby is 74 hours old, Breast feeding 20-40 mins , average 30 mins. Latch scores- 6-8-7-7-8.  And has been supplementing with 93F feeding tube or curved tip syringe. ( dad was asking which way is best , ) LC recommended The 93F feeding SNS  tube due to enhancing baby to stay in a consistent feeding pattern. LC also recommended after feeding when baby  Isn't cluster feeding to post pump with the DEBP ( mom has a Medela ) for 10-15 mins , save milk and supplement back to baby.  Latch scores - 6-8-7-7-8, Voids and stools adequate, Supplementing with formula 10-20 ml.  Sore nipple and engorgement prevention and tx reviewed. Extra pumping.  Mom provided with Comfort gels and shells. [place] asked mom and dad to call when she gets ready to be instructed.  Presently mom feeling nauseated and having abdominal cramping.  Mother informed of post-discharge support and given phone number to the lactation department, including services for phone call assistance; out-patient appointments; and breastfeeding support group. List of other breastfeeding resources in the community given in the handout. Encouraged mother to call for problems or concerns related to breastfeeding.    Maternal Data    Feeding Feeding Type:  (per dad baby recently fed at 1030am for 20 mins ) Length of feed: 20 min (per dad )  LATCH Score/Interventions                Intervention(s): Breastfeeding basics reviewed     Lactation Tools Discussed/Used     Consult Status Consult Status: Follow-up Date: 12/19/14 Follow-up type: In-patient    Myer Haff 12/19/2014, 11:59 AM

## 2014-12-19 NOTE — Discharge Summary (Signed)
OB Discharge Summary     Patient Name: Sarah Castillo DOB: 03-13-1985 MRN: [id number]  Date of admission: 12/16/2014 Delivering MD: Crawford Givens   Date of discharge: 12/19/2014  Admitting diagnosis: 24 WEEKS ROM CTX with fetal malpresentation Intrauterine pregnancy: [redacted]w[redacted]d     Secondary diagnosis:  Active Problems:   S/P C-section  Additional problems: None     Discharge diagnosis: Term Pregnancy Delivered, S/P Primary C/S                                                                                                Post partum procedures:None  Augmentation: N/A  Complications: None  Hospital course:  Onset of Labor With Unplanned C/S  29 y.o. yo G1P1001 at [redacted]w[redacted]d was admitted in Latent Labor with SROM on 12/16/2014. Patient with known fetal malpresentation upon arrival to [facility]U for evaluation. Membrane Rupture Time/Date: 3:30 AM ,12/16/2014 . The patient went for cesarean section due to [facility] and Elective Primary scheduled as ECV declined, and delivered a Viable infant,12/16/2014  Details of operation can be found in separate operative note. Patient had an uncomplicated postpartum course.  She is ambulating,tolerating a regular diet, passing flatus, and urinating well.  Patient is discharged home in stable condition on 12/19/2014 with infant.    Physical exam  Filed Vitals:   12/17/14 1725 12/18/14 0514 12/18/14 1655 12/19/14 0602  BP: 121/65 111/60 109/72 108/69  Pulse: 100 92 97 90  Temp: 98.2 F (36.8 C) 98.6 F (37 C) 98.4 F (36.9 C) 98.7 F (37.1 C)  TempSrc: Oral Oral Oral Oral  Resp: 18 18 18 18   Height:      Weight:      SpO2:       General: alert, cooperative and no distress Lochia: appropriate Uterine Fundus: firm Incision: Healing well with no significant drainage,DVT Evaluation: No evidence of DVT seen on physical exam. Labs: Lab Results  Component Value Date   WBC 9.9 12/17/2014   HGB 8.1* 12/17/2014   HCT 25.2* 12/17/2014   MCV  78.0 12/17/2014   PLT 158 12/17/2014   CMP Latest Ref Rng 03/25/2014  Glucose 70 - 99 mg/dL 77  BUN 6 - 23 mg/dL 10  Creatinine 0.40 - 1.20 mg/dL 0.79  Sodium 135 - 145 mEq/L 137  Potassium 3.5 - 5.1 mEq/L 4.0  Chloride 96 - 112 mEq/L 103  CO2 19 - 32 mEq/L 28  Calcium 8.4 - 10.5 mg/dL 9.6  Total Protein 6.0 - 8.3 g/dL 7.7  Total Bilirubin 0.2 - 1.2 mg/dL 0.3  Alkaline Phos 39 - 117 U/L 63  AST 0 - 37 U/L 26  ALT 0 - 35 U/L 31    Discharge instruction: per After Visit Summary and "Baby and Me Booklet". Instructions given including remove dressing in 24 hours, steri strips should be removed in 7 days. Incision care.  S/S of PPD and who and when to call for PP concerns.    After visit meds:    Medication List    STOP taking these medications        acetaminophen 500 MG tablet  Commonly known as:  TYLENOL     DICLEGIS PO     fluticasone 50 MCG/ACT nasal spray  Commonly known as:  FLONASE      TAKE these medications        ARTIFICIAL TEAR OP  Apply 1 drop to eye as needed (dry eyes).     cholecalciferol 1000 UNITS tablet  Commonly known as:  VITAMIN D  Take 4,000 Units by mouth daily.     ferrous sulfate 325 (65 FE) MG tablet  Commonly known as:  IRON SUPPLEMENT  Take 1 tablet (325 mg total) by mouth 2 (two) times daily with a meal. For 14 days, then once daily for 28 days.     ibuprofen 600 MG tablet  Commonly known as:  ADVIL,MOTRIN  Take 1 tablet (600 mg total) by mouth every 6 (six) hours as needed for mild pain.     loratadine 10 MG tablet  Commonly known as:  CLARITIN  Take 10 mg by mouth at bedtime.     omeprazole 20 MG capsule  Commonly known as:  PRILOSEC  Take 1 capsule (20 mg total) by mouth 2 (two) times daily before a meal.     oxyCODONE-acetaminophen 5-325 MG tablet  Commonly known as:  PERCOCET/ROXICET  Take 1-2 tablets by mouth every 4 (four) hours as needed for moderate pain.     prenatal multivitamin Tabs tablet  Take 1 tablet by mouth  daily at 12 noon.     simethicone 125 MG chewable tablet  Commonly known as:  MYLICON  Chew 0000000 mg by mouth every 6 (six) hours as needed for flatulence.        Diet: routine diet  Activity: Advance as tolerated. Pelvic rest for 6 weeks.   Outpatient follow up:6 weeks Follow up Appt:No future appointments. Follow up Visit:No Follow-up on file.  Postpartum contraception: Undecided  Newborn Data: Live born female  Birth Weight: 6 lb 10.7 oz (3025 g) APGAR: 5, 8  Baby Feeding: Breast Disposition:home with mother   12/19/2014 Maryann Conners, CNM

## 2014-12-19 NOTE — Lactation Note (Signed)
This note was copied from the chart of Sarah Karyl Santone. Lactation Consultation Note  Patient Name: Sarah Castillo [id number] Date: 12/19/2014 Reason for consult: Follow-up assessment;Other (Comment) (mom and dad instructed on use shells and comfort gels)  LC was called back by mom to complete consult.   Maternal Data    Feeding Feeding Type:  (per dad baby recently fed at 1030am for 20 mins ) Length of feed: 20 min (per dad )  LATCH Score/Interventions                Intervention(s): Breastfeeding basics reviewed     Lactation Tools Discussed/Used Tools: Shells;Comfort gels   Consult Status Consult Status: Complete Date: 12/19/14 Follow-up type: In-patient    Myer Haff 12/19/2014, 12:35 PM

## 2014-12-21 ENCOUNTER — Inpatient Hospital Stay (HOSPITAL_COMMUNITY): Admission: RE | Admit: 2014-12-21 | Payer: 59 | Source: Ambulatory Visit | Admitting: Obstetrics and Gynecology

## 2014-12-21 ENCOUNTER — Encounter (HOSPITAL_COMMUNITY): Admission: RE | Payer: Self-pay | Source: Ambulatory Visit

## 2014-12-21 SURGERY — Surgical Case
Anesthesia: Regional

## 2015-01-01 ENCOUNTER — Encounter: Payer: Self-pay | Admitting: Family Medicine

## 2015-01-01 ENCOUNTER — Ambulatory Visit (INDEPENDENT_AMBULATORY_CARE_PROVIDER_SITE_OTHER): Payer: 59 | Admitting: Family Medicine

## 2015-01-01 VITALS — BP 110/80 | HR 102 | Temp 98.5°F | Ht 64.0 in | Wt 182.5 lb

## 2015-01-01 DIAGNOSIS — R5382 Chronic fatigue, unspecified: Secondary | ICD-10-CM | POA: Diagnosis not present

## 2015-01-01 DIAGNOSIS — M26622 Arthralgia of left temporomandibular joint: Secondary | ICD-10-CM

## 2015-01-01 DIAGNOSIS — D62 Acute posthemorrhagic anemia: Secondary | ICD-10-CM | POA: Diagnosis not present

## 2015-01-01 DIAGNOSIS — M26629 Arthralgia of temporomandibular joint, unspecified side: Secondary | ICD-10-CM | POA: Insufficient documentation

## 2015-01-01 DIAGNOSIS — R202 Paresthesia of skin: Secondary | ICD-10-CM | POA: Diagnosis not present

## 2015-01-01 HISTORY — DX: Acute posthemorrhagic anemia: D62

## 2015-01-01 LAB — CBC WITH DIFFERENTIAL/PLATELET
Basophils Absolute: 0 10*3/uL (ref 0.0–0.1)
Basophils Relative: 0 % (ref 0–1)
EOS ABS: 0.2 10*3/uL (ref 0.0–0.7)
EOS PCT: 3 % (ref 0–5)
HEMATOCRIT: 32.4 % — AB (ref 36.0–46.0)
Hemoglobin: 10.5 g/dL — ABNORMAL LOW (ref 12.0–15.0)
LYMPHS ABS: 1.9 10*3/uL (ref 0.7–4.0)
LYMPHS PCT: 27 % (ref 12–46)
MCH: 24.6 pg — AB (ref 26.0–34.0)
MCHC: 32.4 g/dL (ref 30.0–36.0)
MCV: 75.9 fL — AB (ref 78.0–100.0)
MONOS PCT: 5 % (ref 3–12)
MPV: 10 fL (ref 8.6–12.4)
Monocytes Absolute: 0.4 10*3/uL (ref 0.1–1.0)
Neutro Abs: 4.6 10*3/uL (ref 1.7–7.7)
Neutrophils Relative %: 65 % (ref 43–77)
PLATELETS: 303 10*3/uL (ref 150–400)
RBC: 4.27 MIL/uL (ref 3.87–5.11)
RDW: 16.2 % — AB (ref 11.5–15.5)
WBC: 7.1 10*3/uL (ref 4.0–10.5)

## 2015-01-01 LAB — TSH: TSH: 1.522 u[IU]/mL (ref 0.350–4.500)

## 2015-01-01 LAB — FERRITIN: Ferritin: 15 ng/mL (ref 10–291)

## 2015-01-01 NOTE — Progress Notes (Signed)
Subjective:    Patient ID: Sarah Castillo, female    DOB: 1985/12/22, 29 y.o.   MRN: [id number]  HPI Here for headache and tigling   Rough pregnancy Breech  Had a CS - some swelling in incision site   Having headaches  3-4 days ago  Took 2 excedrin , helped after 2 doses  On L side of head and it rad to her neck   Had migraines a long time ago   Stuffy nose L ear hurts some as well   Some sweats when she was in the hospital  Also snoring  Would occ wake up gasping for air  Palpitations on and off Numbness and tingling - mostly in medial arms and fingers   Lab Results  Component Value Date   WBC 9.9 12/17/2014   HGB 8.1* 12/17/2014   HCT 25.2* 12/17/2014   MCV 78.0 12/17/2014   PLT 158 12/17/2014   talked to Dr Waymon Budge - disc poss of IV iron   Iron severely constipates her   Has been anemic in the past - usually Hb 11-12 baseline   Also still vaginal bleeding   Lab Results  Component Value Date   TSH 1.72 03/25/2014    Patient Active Problem List   Diagnosis Date Noted  . Temporomandibular joint (TMJ) pain 01/01/2015  . Acute blood loss anemia 01/01/2015  . Tingling in extremities 01/01/2015  . S/P C-section 12/16/2014  . Fatigue 03/25/2014  . Rash and nonspecific skin eruption 03/25/2014  . Nausea without vomiting 02/17/2014  . Breast pain 02/17/2014  . Myofascial pain 12/24/2013  . Tachycardia 12/24/2013  . PCOS (polycystic ovarian syndrome) 11/25/2013  . Adjustment disorder with mixed anxiety and depressed mood 11/25/2013  . Allergic rhinitis 11/25/2013  . GERD (gastroesophageal reflux disease) 11/25/2013  . IBS (irritable bowel syndrome) 11/25/2013  . Left knee pain 11/06/2013  . S/P lumbar fusion 11/06/2013   Past Medical History  Diagnosis Date  . Anxiety   . Depression   . PCOS (polycystic ovarian syndrome)   . IBS (irritable bowel syndrome)   . Allergy   . Diabetes mellitus without complication ([place])   . Gestational  diabetes mellitus (GDM), antepartum   . Acute blood loss anemia 01/01/2015   Past Surgical History  Procedure Laterality Date  . Lumbar fusion  12/2012    L5S1  . Lumbar disc surgery  09/2007    L5S1  . Lasik  2012  . Colonoscopy w/ biopsies  01/19/09    normal  . Esophagogastroduodenoscopy  08/03/08    mild gastritis and HH  . Cesarean section N/A 12/16/2014    Procedure: CESAREAN SECTION;  Surgeon: Crawford Givens, MD;  Location: [facility];  Service: Obstetrics;  Laterality: N/A;   Social History  Substance Use Topics  . Smoking status: Never Smoker   . Smokeless tobacco: Never Used  . Alcohol Use: No   Family History  Problem Relation Age of Onset  . Colon cancer Cousin   . Diabetes Paternal Grandmother   . Diabetes Paternal Grandfather   . Diabetes Maternal Grandmother   . Diabetes Maternal Grandfather   . Heart attack Father   . Hypertension Father   . Hyperlipidemia Father   . Hypertension Mother   . Thyroid disease Mother   . Hyperlipidemia Mother    Allergies  Allergen Reactions  . Metformin And Related Diarrhea    GI side eff   . Zoloft [Sertraline] Diarrhea  . Morphine Rash and Other (See  Comments)    Other Reaction: FEVER   Current Outpatient Prescriptions on File Prior to Visit  Medication Sig Dispense Refill  . ARTIFICIAL TEAR OP Apply 1 drop to eye as needed (dry eyes).    Marland Kitchen ibuprofen (ADVIL,MOTRIN) 600 MG tablet Take 1 tablet (600 mg total) by mouth every 6 (six) hours as needed for mild pain. 30 tablet 2  . loratadine (CLARITIN) 10 MG tablet Take 10 mg by mouth at bedtime.     Marland Kitchen omeprazole (PRILOSEC) 20 MG capsule Take 1 capsule (20 mg total) by mouth 2 (two) times daily before a meal. (Patient taking differently: Take 20 mg by mouth daily. ) 180 capsule 3  . Prenatal Vit-Fe Fumarate-FA (PRENATAL MULTIVITAMIN) TABS tablet Take 1 tablet by mouth daily at 12 noon.    . ferrous sulfate (IRON SUPPLEMENT) 325 (65 FE) MG tablet Take 1 tablet (325 mg total) by  mouth 2 (two) times daily with a meal. For 14 days, then once daily for 28 days. (Patient not taking: Reported on 01/01/2015) 45 tablet 2  . oxyCODONE-acetaminophen (PERCOCET/ROXICET) 5-325 MG tablet Take 1-2 tablets by mouth every 4 (four) hours as needed for moderate pain. (Patient not taking: Reported on 01/01/2015) 30 tablet 0  . simethicone (MYLICON) 0000000 MG chewable tablet Chew 125 mg by mouth every 6 (six) hours as needed for flatulence. Reported on 01/01/2015     No current facility-administered medications on file prior to visit.     Review of Systems Review of Systems  Constitutional: Negative for fever, appetite change,  and unexpected weight change.  Eyes: Negative for pain and visual disturbance.  ENT pos for L jaw pain worse with movement Respiratory: Negative for cough and shortness of breath.   Cardiovascular: Negative for cp or palpitations    Gastrointestinal: Negative for nausea, diarrhea and constipation.  Genitourinary: Negative for urgency and frequency. neg for dysuria / pos for vaginal bleeding Skin: Negative for pallor or rash   Neurological: Negative for weakness, light-headedness,and pos for headaches.  Hematological: Negative for adenopathy. Does not bruise/bleed easily.  Psychiatric/Behavioral: Negative for dysphoric mood. The patient is  nervous/anxious.         Objective:   Physical Exam  Constitutional: She is oriented to person, place, and time. She appears well-developed and well-nourished. No distress.  obese and well appearing   HENT:  Head: Normocephalic and atraumatic.  Right Ear: External ear normal.  Left Ear: External ear normal.  Nose: Nose normal.  Mouth/Throat: Oropharynx is clear and moist. No oropharyngeal exudate.  No sinus tenderness No temporal tenderness  L TMJ is tender to the touch and pops -but not dislocated   Eyes: Conjunctivae and EOM are normal. Pupils are equal, round, and reactive to light. Right eye exhibits no discharge.  Left eye exhibits no discharge. No scleral icterus.  No nystagmus  Neck: Normal range of motion and full passive range of motion without pain. Neck supple. No JVD present. Carotid bruit is not present. No tracheal deviation present. No thyromegaly present.  Cardiovascular: Normal rate, regular rhythm, normal heart sounds and intact distal pulses.  Exam reveals no gallop.   No murmur heard. Pulmonary/Chest: Effort normal and breath sounds normal. No respiratory distress. She has no wheezes. She has no rales.  No crackles  Abdominal: Soft. Bowel sounds are normal. She exhibits no distension, no abdominal bruit and no mass. There is no tenderness.  Musculoskeletal: She exhibits no edema or tenderness.  Lymphadenopathy:    She has no  cervical adenopathy.  Neurological: She is alert and oriented to person, place, and time. She has normal strength and normal reflexes. She displays no atrophy and no tremor. No cranial nerve deficit or sensory deficit. She exhibits normal muscle tone. She displays a negative Romberg sign. Coordination and gait normal.  No focal cerebellar signs   Neg tinel and phalen tests bilat wrists Nl grip bilaterally  Skin: Skin is warm and dry. No rash noted. No pallor.  Psychiatric: Her behavior is normal. Thought content normal. Her mood appears anxious.  Mildly anxious today          Assessment & Plan:   Problem List Items Addressed This Visit      Musculoskeletal and Integument   Temporomandibular joint (TMJ) pain - Primary    Suspect this is adding to L HA Joint is tender and pops  Disc eating soft diet/drinking through a straw and elim gum Enc visit to dentist to consider nite guard otc ibuprofen prn and ice for this and ha         Other   Acute blood loss anemia    Acute on chronic  Does not tolerate iron orally at all Lab today  Will likely need ref to hematology- her husb already spoke to [place] re: IV iron repl      Relevant Orders   CBC  with Differential/Platelet (Completed)   Ferritin (Completed)   IBC Panel (Completed)   Fatigue    Suspect multifactorial with new baby/hormone changes and anemia Lab today  May consider ref to hematology for IV iron if cbc does not improve       Relevant Orders   CBC with Differential/Platelet (Completed)   TSH (Completed)   Tingling in extremities    This is ongoing since pregnancy Disc poss of carpal tunnel or ulnar nerve issue Also anemia/thyroid and other hormone changes  Lab today  Nl neuro exam      Relevant Orders   CBC with Differential/Platelet (Completed)   TSH (Completed)   Ferritin (Completed)   IBC Panel (Completed)

## 2015-01-01 NOTE — Progress Notes (Signed)
Pre visit review using our clinic review tool, if applicable. No additional management support is needed unless otherwise documented below in the visit note. 

## 2015-01-01 NOTE — Patient Instructions (Signed)
I think you have TMJ on the Left - worsening headache  Use ice on your jaw when you can  Tylenol and ibuprofen are ok  Drink through a straw  Try to avoid chewy foods Look into a bit guard from dentist   Some of your symptoms are coming from hormone change Some from the anemia   Checking blood count, ferrtin, iron and thyroid today  We will likely refer you to hematology - Dr Waymon Budge

## 2015-01-04 LAB — IRON: Iron: 41 ug/dL (ref 40–190)

## 2015-01-04 LAB — IBC PANEL
%SAT: 10 % — ABNORMAL LOW (ref 11–50)
TIBC: 416 ug/dL (ref 250–450)
UIBC: 375 ug/dL (ref 125–400)

## 2015-01-04 NOTE — Assessment & Plan Note (Signed)
Acute on chronic  Does not tolerate iron orally at all Lab today  Will likely need ref to hematology- her husb already spoke to [place] re: IV iron repl

## 2015-01-04 NOTE — Assessment & Plan Note (Signed)
Suspect multifactorial with new baby/hormone changes and anemia Lab today  May consider ref to hematology for IV iron if cbc does not improve

## 2015-01-04 NOTE — Assessment & Plan Note (Signed)
This is ongoing since pregnancy Disc poss of carpal tunnel or ulnar nerve issue Also anemia/thyroid and other hormone changes  Lab today  Nl neuro exam

## 2015-01-04 NOTE — Assessment & Plan Note (Signed)
Suspect this is adding to L HA Joint is tender and pops  Disc eating soft diet/drinking through a straw and elim gum Enc visit to dentist to consider nite guard otc ibuprofen prn and ice for this and ha

## 2015-01-06 ENCOUNTER — Encounter: Payer: Self-pay | Admitting: Family Medicine

## 2015-01-22 ENCOUNTER — Telehealth: Payer: Self-pay | Admitting: Family Medicine

## 2015-01-22 NOTE — Telephone Encounter (Signed)
Spoke to pts husband who states iron was bought OTC. Dr Glori Bickers advised

## 2015-01-22 NOTE — Telephone Encounter (Signed)
It looks like she did not read her last mchart message from me -can you please call her and remind her to  I think I was asking what pharmacy to send her iron to  Thanks

## 2015-02-04 DIAGNOSIS — F419 Anxiety disorder, unspecified: Secondary | ICD-10-CM | POA: Diagnosis not present

## 2015-02-04 DIAGNOSIS — F3289 Other specified depressive episodes: Secondary | ICD-10-CM | POA: Diagnosis not present

## 2015-02-05 ENCOUNTER — Telehealth: Payer: Self-pay | Admitting: Family Medicine

## 2015-02-05 NOTE — Telephone Encounter (Signed)
Go forward with plan for visit at [facility] tomorrow  Can use warm compresses as well

## 2015-02-05 NOTE — Telephone Encounter (Signed)
PLEASE NOTE: All timestamps contained within this report are represented as [place] Standard Time. CONFIDENTIALTY NOTICE: This fax transmission is intended only for the addressee. It contains information that is legally privileged, confidential or otherwise protected from use or disclosure. If you are not the intended recipient, you are strictly prohibited from reviewing, disclosing, copying using or disseminating any of this information or taking any action in reliance on or regarding this information. If you have received this fax in error, please notify us immediately by telephone so that we can arrange for its return to [place]. Phone: [phone], Toll-Free: [phone], Fax: [phone] Page: 1 of 1 Call Id: [id number] [place] Patient Name: Sarah Castillo DOB: 11/06/1985 Initial Comment Caller states breastfeeding and having pain in breast Nurse Assessment Nurse: Marcelline Deist, RN, Kermit Balo Date/Time (Eastern Time): 02/05/2015 2:04:10 PM Confirm and document reason for call. If symptomatic, describe symptoms. You must click the next button to save text entered. ---Caller states breastfeeding and having pain in right breast. Baby is almost 65 weeks old. Also has a white crust at nipple area, not all of milk can be expressed. Has sharp, tingling pain in breast. Has tried Ibuprofen, warm compresses, expressing milk. Has made sure baby is latched ok. No fever. Has the patient traveled out of the country within the last 30 days? ---Not Applicable Does the patient have any new or worsening symptoms? ---Yes Will a triage be completed? ---Yes Related visit to physician within the last 2 weeks? ---Yes Does the PT have any chronic conditions? (i.e. diabetes, asthma, etc.) ---Yes List chronic conditions. ---acid reflux Did the patient indicate they were pregnant? ---No Is this a behavioral health or substance  abuse call? ---No Guidelines Guideline Title Affirmed Question Affirmed Notes Postpartum - Breastfeeding Questions Blocked milk ducts (tender lumps in the breast), questions about Final Disposition [place], RN, Lynda Comments Caller states she has an appt. at [facility] tomorrow am. Nurse is wondering if she could have the beginnings of mastitis or even thrush. reports no symptoms with baby. States part of the nipple is white and it won't rub off. It has clogged part of nipple. Please contact caller with further advice. Disagree/Comply: Comply

## 2015-02-06 ENCOUNTER — Ambulatory Visit (INDEPENDENT_AMBULATORY_CARE_PROVIDER_SITE_OTHER): Payer: 59 | Admitting: Primary Care

## 2015-02-06 ENCOUNTER — Encounter: Payer: Self-pay | Admitting: Primary Care

## 2015-02-06 VITALS — BP 96/70 | HR 80 | Temp 97.6°F | Wt 181.0 lb

## 2015-02-06 DIAGNOSIS — O9229 Other disorders of breast associated with pregnancy and the puerperium: Secondary | ICD-10-CM

## 2015-02-06 NOTE — Patient Instructions (Addendum)
Apply warm compresses to your right breast 4 times daily for 30 minutes at a time.  You may take ibuprofen 800 mg three times daily for pain and inflammation.  Please call your OB on Monday to discuss other options. Please call me if you develop fevers, chills, redness to your skin.  It was a pleasure meeting you!

## 2015-02-06 NOTE — Progress Notes (Signed)
Subjective:    Patient ID: Sarah Castillo, female    DOB: Jan 11, 1985, 30 y.o.   MRN: [id number]  HPI  [name] is a 30 year old female who presents today with a chief complaint of breast pain. Her pain is present to bilateral breasts and is generalized. Her skin is tender upon palpation. She is currently breast feeding her 72 weeks old infant. She feels as though her nipple is "clogged". She describes her pain as shooting and burning. She's applied warm compresses and taken ibuprofen with some improvement. Denies fevers, bleeding from her nipple, erythema or swelling to breasts. Her symptoms have been present intermittently since about 2 weeks after giving birth.  Review of Systems  Constitutional: Negative for fever and chills.  Skin: Negative for color change and wound.       Past Medical History  Diagnosis Date  . Anxiety   . Depression   . PCOS (polycystic ovarian syndrome)   . IBS (irritable bowel syndrome)   . Allergy   . Diabetes mellitus without complication ([place])   . Gestational diabetes mellitus (GDM), antepartum   . Acute blood loss anemia 01/01/2015    Social History   Social History  . Marital Status: Married    Spouse Name: N/A  . Number of Children: N/A  . Years of Education: N/A   Occupational History  . Not on file.   Social History Main Topics  . Smoking status: Never Smoker   . Smokeless tobacco: Never Used  . Alcohol Use: No  . Drug Use: No  . Sexual Activity:    Partners: Male    Birth Control/ Protection: None   Other Topics Concern  . Not on file   Social History Narrative   From [place] originally       Moved here from [place] in 2015    Husband is a medical resident at [facility] for IM       She stays home -considering school and starting a family        Past Surgical History  Procedure Laterality Date  . Lumbar fusion  12/2012    L5S1  . Lumbar disc surgery  09/2007    L5S1  . Lasik  2012  . Colonoscopy w/ biopsies   01/19/09    normal  . Esophagogastroduodenoscopy  08/03/08    mild gastritis and HH  . Cesarean section N/A 12/16/2014    Procedure: CESAREAN SECTION;  Surgeon: Crawford Givens, MD;  Location: [facility];  Service: Obstetrics;  Laterality: N/A;    Family History  Problem Relation Age of Onset  . Colon cancer Cousin   . Diabetes Paternal Grandmother   . Diabetes Paternal Grandfather   . Diabetes Maternal Grandmother   . Diabetes Maternal Grandfather   . Heart attack Father   . Hypertension Father   . Hyperlipidemia Father   . Hypertension Mother   . Thyroid disease Mother   . Hyperlipidemia Mother     Allergies  Allergen Reactions  . Metformin And Related Diarrhea    GI side eff   . Zoloft [Sertraline] Diarrhea  . Morphine Rash and Other (See Comments)    Other Reaction: FEVER    Current Outpatient Prescriptions on File Prior to Visit  Medication Sig Dispense Refill  . ARTIFICIAL TEAR OP Apply 1 drop to eye as needed (dry eyes).    . Cholecalciferol (VITAMIN D3 PO) Take 4,000 Units by mouth daily.    . fluticasone (FLONASE) 50 MCG/ACT nasal  spray Place into both nostrils daily.    Marland Kitchen loratadine (CLARITIN) 10 MG tablet Take 10 mg by mouth at bedtime.     Marland Kitchen omeprazole (PRILOSEC) 20 MG capsule Take 1 capsule (20 mg total) by mouth 2 (two) times daily before a meal. (Patient taking differently: Take 20 mg by mouth daily. ) 180 capsule 3  . Prenatal Vit-Fe Fumarate-FA (PRENATAL MULTIVITAMIN) TABS tablet Take 1 tablet by mouth daily at 12 noon.    . simethicone (MYLICON) 0000000 MG chewable tablet Chew 125 mg by mouth every 6 (six) hours as needed for flatulence. Reported on 01/01/2015    . ferrous sulfate (IRON SUPPLEMENT) 325 (65 FE) MG tablet Take 1 tablet (325 mg total) by mouth 2 (two) times daily with a meal. For 14 days, then once daily for 28 days. (Patient not taking: Reported on 01/01/2015) 45 tablet 2  . ibuprofen (ADVIL,MOTRIN) 600 MG tablet Take 1 tablet (600 mg total) by mouth  every 6 (six) hours as needed for mild pain. (Patient not taking: Reported on 02/06/2015) 30 tablet 2  . oxyCODONE-acetaminophen (PERCOCET/ROXICET) 5-325 MG tablet Take 1-2 tablets by mouth every 4 (four) hours as needed for moderate pain. (Patient not taking: Reported on 01/01/2015) 30 tablet 0   No current facility-administered medications on file prior to visit.    BP 96/70 mmHg  Pulse 80  Temp(Src) 97.6 F (36.4 C)  Wt 181 lb (82.101 kg)    Objective:   Physical Exam  Constitutional: She appears well-nourished.  Cardiovascular: Normal rate and regular rhythm.   Pulmonary/Chest: Effort normal and breath sounds normal. Right breast exhibits tenderness. Right breast exhibits no skin change. Left breast exhibits tenderness. Left breast exhibits no skin change.  Generalized tenderness to bilateral breasts. Dense tissue noted. Nipples without erythema or s/s of infection. Small ductal clog likely to right nipple.   Skin: Skin is dry. No erythema.          Assessment & Plan:  Breast Pain:  Located bilaterally and is generalized. She is a new mother who has been breastfeeding for 7 weeks. No apparent s/s of infection or abscesses to breasts bilaterally. Nipples are of normal shape, color, texture. Dense tissue noted bilaterally. Discussed changes in breasts with breast feeding and that this is expected. Did discuss to notify GYN of breast tenderness and to follow up if this does not dissipate/becomes worse. Warm compresses to right nipple to help loosen ductal clog. Ibuprofen PRN pain. Return precautions provided.

## 2015-03-01 ENCOUNTER — Other Ambulatory Visit (INDEPENDENT_AMBULATORY_CARE_PROVIDER_SITE_OTHER): Payer: Self-pay

## 2015-03-01 DIAGNOSIS — M5416 Radiculopathy, lumbar region: Secondary | ICD-10-CM

## 2015-03-10 ENCOUNTER — Ambulatory Visit (INDEPENDENT_AMBULATORY_CARE_PROVIDER_SITE_OTHER): Payer: 59 | Admitting: Neurological Surgery

## 2015-04-01 ENCOUNTER — Ambulatory Visit: Payer: Self-pay | Admitting: Family Medicine

## 2015-04-02 ENCOUNTER — Emergency Department
Admission: EM | Admit: 2015-04-02 | Discharge: 2015-04-02 | Disposition: A | Discharge status: Home / Self Care | Payer: 59 | Attending: Emergency Medicine | Admitting: Emergency Medicine

## 2015-04-02 ENCOUNTER — Telehealth: Payer: Self-pay

## 2015-04-02 ENCOUNTER — Emergency Department: Payer: 59

## 2015-04-02 DIAGNOSIS — Z7951 Long term (current) use of inhaled steroids: Secondary | ICD-10-CM | POA: Diagnosis not present

## 2015-04-02 DIAGNOSIS — R05 Cough: Secondary | ICD-10-CM | POA: Diagnosis not present

## 2015-04-02 DIAGNOSIS — K920 Hematemesis: Secondary | ICD-10-CM | POA: Diagnosis not present

## 2015-04-02 DIAGNOSIS — Z3202 Encounter for pregnancy test, result negative: Secondary | ICD-10-CM | POA: Insufficient documentation

## 2015-04-02 DIAGNOSIS — E119 Type 2 diabetes mellitus without complications: Secondary | ICD-10-CM | POA: Diagnosis not present

## 2015-04-02 DIAGNOSIS — J0100 Acute maxillary sinusitis, unspecified: Secondary | ICD-10-CM | POA: Insufficient documentation

## 2015-04-02 DIAGNOSIS — R0981 Nasal congestion: Secondary | ICD-10-CM | POA: Diagnosis present

## 2015-04-02 DIAGNOSIS — R509 Fever, unspecified: Secondary | ICD-10-CM | POA: Diagnosis not present

## 2015-04-02 DIAGNOSIS — Z79899 Other long term (current) drug therapy: Secondary | ICD-10-CM | POA: Insufficient documentation

## 2015-04-02 LAB — CBC
HCT: 30.9 % — ABNORMAL LOW (ref 35.0–47.0)
Hemoglobin: 10.1 g/dL — ABNORMAL LOW (ref 12.0–16.0)
MCH: 22 pg — ABNORMAL LOW (ref 26.0–34.0)
MCHC: 32.7 g/dL (ref 32.0–36.0)
MCV: 67.4 fL — AB (ref 80.0–100.0)
PLATELETS: 211 10*3/uL (ref 150–440)
RBC: 4.58 MIL/uL (ref 3.80–5.20)
RDW: 17.5 % — AB (ref 11.5–14.5)
WBC: 5.6 10*3/uL (ref 3.6–11.0)

## 2015-04-02 LAB — COMPREHENSIVE METABOLIC PANEL
ALT: 46 U/L (ref 14–54)
AST: 40 U/L (ref 15–41)
Albumin: 3.8 g/dL (ref 3.5–5.0)
Alkaline Phosphatase: 74 U/L (ref 38–126)
Anion gap: 7 (ref 5–15)
BUN: 11 mg/dL (ref 6–20)
CHLORIDE: 103 mmol/L (ref 101–111)
CO2: 27 mmol/L (ref 22–32)
CREATININE: 0.64 mg/dL (ref 0.44–1.00)
Calcium: 9.3 mg/dL (ref 8.9–10.3)
Glucose, Bld: 102 mg/dL — ABNORMAL HIGH (ref 65–99)
POTASSIUM: 3.2 mmol/L — AB (ref 3.5–5.1)
SODIUM: 137 mmol/L (ref 135–145)
Total Bilirubin: 0.5 mg/dL (ref 0.3–1.2)
Total Protein: 6.9 g/dL (ref 6.5–8.1)

## 2015-04-02 LAB — TROPONIN I: Troponin I: <0.03 ng/mL (ref <0.031)

## 2015-04-02 LAB — RAPID INFLUENZA A&B ANTIGENS (ARMC ONLY)
INFLUENZA A (ARMC): NEGATIVE
INFLUENZA B (ARMC): NEGATIVE

## 2015-04-02 LAB — POCT PREGNANCY, URINE: PREG TEST UR: NEGATIVE

## 2015-04-02 MED ORDER — GI COCKTAIL ~~LOC~~
30.0000 mL | Freq: Once | ORAL | Status: AC
  Administered 2015-04-02: 30 mL via ORAL

## 2015-04-02 MED ORDER — POTASSIUM CHLORIDE 20 MEQ PO PACK
40.0000 meq | PACK | Freq: Once | ORAL | Status: AC
  Administered 2015-04-02: 40 meq via ORAL
  Filled 2015-04-02: qty 2

## 2015-04-02 MED ORDER — GI COCKTAIL ~~LOC~~
ORAL | Status: AC
  Filled 2015-04-02: qty 30

## 2015-04-02 NOTE — Discharge Instructions (Signed)

## 2015-04-02 NOTE — ED Notes (Signed)
Pt states she thinks she has taken too much cold medication at home. Pt has been c/o vomiting that turned into sore throat and ear pain. Pt states she started having palpitations and was SOB at home. States she has been really restless. Pt A&O

## 2015-04-02 NOTE — ED Notes (Signed)
Pt in with co body aches, vomiting, congestion, and cough since Sunday.

## 2015-04-02 NOTE — Telephone Encounter (Signed)
Aware-she was seen in ED

## 2015-04-02 NOTE — Telephone Encounter (Signed)
PLEASE NOTE: All timestamps contained within this report are represented as [place] Standard Time. CONFIDENTIALTY NOTICE: This fax transmission is intended only for the addressee. It contains information that is legally privileged, confidential or otherwise protected from use or disclosure. If you are not the intended recipient, you are strictly prohibited from reviewing, disclosing, copying using or disseminating any of this information or taking any action in reliance on or regarding this information. If you have received this fax in error, please notify us immediately by telephone so that we can arrange for its return to [place]. Phone: [phone], Toll-Free: [phone], Fax: [phone] Page: 1 of 2 Call Id: [id number] [place] Patient Name: Sarah Castillo Gender: Female DOB: 1985/08/22 Age: 30 Y 3 M 23 D Return Phone Number: [phone] (Primary) Address: City/State/Zip: [place] Client [place] Night - Client Client Site [facility] [place] Contact Type Call Who Is Calling Patient / Member / Family / Caregiver Call Type Triage / Clinical Caller Name [place] Relationship To Patient Spouse Return Phone Number [phone] (Primary) Chief Complaint BREATHING - fast, heavy or wheezing Reason for Call Symptomatic / Request for [place] states his wife has cold symptoms. Took medicine , nasal spray and cold tablets. Has trouble breathing, heaviness , tightness Blood pressure 140/90 PreDisposition InappropriateToAsk Translation No Nurse Assessment Nurse: Ysidro Evert, RN, Shirlean Mylar Date/Time (Eastern Time): 04/02/2015 12:02:15 AM Confirm and document reason for call. If symptomatic, describe symptoms. You must click the next button to save text entered. ---Caller states his wife has cold symptoms. Took OTC  medicine, nasal spray and cold tablets. Has trouble breathing, heaviness , tightness Blood pressure 140/90. Took Alka-Seltzer, Afrin. Has the patient traveled out of the country within the last 30 days? ---No Does the patient have any new or worsening symptoms? ---Yes Will a triage be completed? ---Yes Related visit to physician within the last 2 weeks? ---No Does the PT have any chronic conditions? (i.e. diabetes, asthma, etc.) ---No Is the patient pregnant or possibly pregnant? (Ask all females between the ages of 37-55) ---No Is this a behavioral health or substance abuse call? ---No Guidelines Guideline Title Affirmed Question Affirmed Notes Nurse Date/Time (Eastern Time) Breathing Difficulty Major surgery in the past month Laurier Nancy 04/02/2015 12:06:22 AM Disp. Time Eilene Ghazi Time) Disposition Final User 04/01/2015 11:59:44 PM Send to Urgent Tammi Sou, [place] PLEASE NOTE: All timestamps contained within this report are represented as [place] Standard Time. CONFIDENTIALTY NOTICE: This fax transmission is intended only for the addressee. It contains information that is legally privileged, confidential or otherwise protected from use or disclosure. If you are not the intended recipient, you are strictly prohibited from reviewing, disclosing, copying using or disseminating any of this information or taking any action in reliance on or regarding this information. If you have received this fax in error, please notify us immediately by telephone so that we can arrange for its return to [place]. Phone: [phone], Toll-Free: [phone], Fax: [phone] Page: 2 of 2 Call Id: [id number] 04/02/2015 12:11:00 AM Go to ED Now Yes Ysidro Evert, RN, Alfredo Martinez Understands: Yes Disagree/Comply: Comply Care Advice Given Per Guideline GO TO ED NOW: You need to be seen in the Emergency Department. Go to the ER at ___________ [place] now. Drive carefully. * Another adult should drive. *  Please bring a list of your current medicines when you go to  the Emergency Department (ER). * It is also a good idea to bring the pill bottles too. This will help the doctor to make certain you are taking the right medicines and the right dose. CARE ADVICE given per Breathing Difficulty (Adult) guideline. Referrals [facility] - ED

## 2015-04-02 NOTE — ED Provider Notes (Signed)
[facility] Emergency Department Provider Note  ____________________________________________  Time seen: 2:40 AM  I have reviewed the triage vital signs and the nursing notes.   HISTORY  Chief Complaint Emesis      HPI Sarah Castillo is a 30 y.o. female presents with generalized body aches, and bloody emesis congestion, sore throat and ear pain since Sunday. Patient states she's been taking Alka-Seltzer plus for her symptoms. Patient states tonight she started to experience palpitations and dyspnea at home approximate 5 minutes after taking her Alka-Seltzer plus. Patient describes sensation of something in her throat accompanied by dyspnea.     Past Medical History  Diagnosis Date  . Anxiety   . Depression   . PCOS (polycystic ovarian syndrome)   . IBS (irritable bowel syndrome)   . Allergy   . Diabetes mellitus without complication ([place])   . Gestational diabetes mellitus (GDM), antepartum   . Acute blood loss anemia 01/01/2015    Patient Active Problem List   Diagnosis Date Noted  . Temporomandibular joint (TMJ) pain 01/01/2015  . Acute blood loss anemia 01/01/2015  . Tingling in extremities 01/01/2015  . S/P C-section 12/16/2014  . Fatigue 03/25/2014  . Rash and nonspecific skin eruption 03/25/2014  . Nausea without vomiting 02/17/2014  . Breast pain 02/17/2014  . Myofascial pain 12/24/2013  . Tachycardia 12/24/2013  . PCOS (polycystic ovarian syndrome) 11/25/2013  . Adjustment disorder with mixed anxiety and depressed mood 11/25/2013  . Allergic rhinitis 11/25/2013  . GERD (gastroesophageal reflux disease) 11/25/2013  . IBS (irritable bowel syndrome) 11/25/2013  . Left knee pain 11/06/2013  . S/P lumbar fusion 11/06/2013    Past Surgical History  Procedure Laterality Date  . Lumbar fusion  12/2012    L5S1  . Lumbar disc surgery  09/2007    L5S1  . Lasik  2012  . Colonoscopy w/ biopsies  01/19/09    normal  .  Esophagogastroduodenoscopy  08/03/08    mild gastritis and HH  . Cesarean section N/A 12/16/2014    Procedure: CESAREAN SECTION;  Surgeon: Crawford Givens, MD;  Location: [facility];  Service: Obstetrics;  Laterality: N/A;    Current Outpatient Rx  Name  Route  Sig  Dispense  Refill  . ARTIFICIAL TEAR OP   Ophthalmic   Apply 1 drop to eye as needed (dry eyes).         . Cholecalciferol (VITAMIN D3 PO)   Oral   Take 4,000 Units by mouth daily.         . ferrous sulfate (IRON SUPPLEMENT) 325 (65 FE) MG tablet   Oral   Take 1 tablet (325 mg total) by mouth 2 (two) times daily with a meal. For 14 days, then once daily for 28 days. Patient not taking: Reported on 01/01/2015   45 tablet   2   . fluticasone (FLONASE) 50 MCG/ACT nasal spray   Each Nare   Place into both nostrils daily.         Marland Kitchen ibuprofen (ADVIL,MOTRIN) 600 MG tablet   Oral   Take 1 tablet (600 mg total) by mouth every 6 (six) hours as needed for mild pain. Patient not taking: Reported on 02/06/2015   30 tablet   2   . loratadine (CLARITIN) 10 MG tablet   Oral   Take 10 mg by mouth at bedtime.          Marland Kitchen omeprazole (PRILOSEC) 20 MG capsule   Oral   Take 1 capsule (  20 mg total) by mouth 2 (two) times daily before a meal. Patient taking differently: Take 20 mg by mouth daily.    180 capsule   3   . oxyCODONE-acetaminophen (PERCOCET/ROXICET) 5-325 MG tablet   Oral   Take 1-2 tablets by mouth every 4 (four) hours as needed for moderate pain. Patient not taking: Reported on 01/01/2015   30 tablet   0   . Prenatal Vit-Fe Fumarate-FA (PRENATAL MULTIVITAMIN) TABS tablet   Oral   Take 1 tablet by mouth daily at 12 noon.         . simethicone (MYLICON) 0000000 MG chewable tablet   Oral   Chew 125 mg by mouth every 6 (six) hours as needed for flatulence. Reported on 01/01/2015           Allergies Metformin and related; Zoloft; and Morphine  Family History  Problem Relation Age of Onset  . Colon cancer  Cousin   . Diabetes Paternal Grandmother   . Diabetes Paternal Grandfather   . Diabetes Maternal Grandmother   . Diabetes Maternal Grandfather   . Heart attack Father   . Hypertension Father   . Hyperlipidemia Father   . Hypertension Mother   . Thyroid disease Mother   . Hyperlipidemia Mother     Social History Social History  Substance Use Topics  . Smoking status: Never Smoker   . Smokeless tobacco: Never Used  . Alcohol Use: No    Review of Systems  Constitutional: Negative for fever. Eyes: Negative for visual changes. ENT: Negative for sore throat. Positive for nasal congestion Cardiovascular: Negative for chest pain. Respiratory: Positive for shortness of breath. Positive for cough Gastrointestinal: Negative for abdominal pain, vomiting and diarrhea. Genitourinary: Negative for dysuria. Musculoskeletal: Negative for back pain. Skin: Negative for rash. Neurological: Negative for headaches, focal weakness or numbness.   10-point ROS otherwise negative.  ____________________________________________   PHYSICAL EXAM:  VITAL SIGNS: ED Triage Vitals  Enc Vitals Group     BP 04/02/15 0223 119/81 mmHg     Pulse Rate 04/02/15 0223 65     Resp 04/02/15 0223 18     Temp 04/02/15 0223 98.1 F (36.7 C)     Temp Source 04/02/15 0223 Oral     SpO2 --      Weight 04/02/15 0221 176 lb (79.833 kg)     Height 04/02/15 0221 5\' 5"  (1.651 m)     Head Cir --      Peak Flow --      Pain Score 04/02/15 0240 0     Pain Loc --      Pain Edu? --      Excl. in [place]? --     Constitutional: Alert and oriented. Well appearing and in no distress. Eyes: Conjunctivae are normal. PERRL. Normal extraocular movements. ENT   Head: Normocephalic and atraumatic.   Nose: No congestion/rhinnorhea.   Mouth/Throat: Mucous membranes are moist.   Neck: No stridor. Hematological/Lymphatic/Immunilogical: No cervical lymphadenopathy. Cardiovascular: Normal rate, regular rhythm.  Normal and symmetric distal pulses are present in all extremities. No murmurs, rubs, or gallops. Respiratory: Normal respiratory effort without tachypnea nor retractions. Breath sounds are clear and equal bilaterally. No wheezes/rales/rhonchi. Gastrointestinal: Soft and nontender. No distention. There is no CVA tenderness. Genitourinary: deferred Musculoskeletal: Nontender with normal range of motion in all extremities. No joint effusions.  No lower extremity tenderness nor edema. Neurologic:  Normal speech and language. No gross focal neurologic deficits are appreciated. Speech is normal.  Skin:  Skin is warm, dry and intact. No rash noted. Psychiatric: Mood and affect are normal. Speech and behavior are normal. Patient exhibits appropriate insight and judgment.  ____________________________________________    LABS (pertinent positives/negatives)  Labs Reviewed  CBC - Abnormal; Notable for the following:    Hemoglobin 10.1 (*)    HCT 30.9 (*)    MCV 67.4 (*)    MCH 22.0 (*)    RDW 17.5 (*)    All other components within normal limits  COMPREHENSIVE METABOLIC PANEL - Abnormal; Notable for the following:    Potassium 3.2 (*)    Glucose, Bld 102 (*)    All other components within normal limits  RAPID INFLUENZA A&B ANTIGENS ([facility] ONLY)  TROPONIN I  POCT PREGNANCY, URINE    ____________________________________________   EKG  ED ECG REPORT I, [place] Trinitee Horgan, the attending physician, personally viewed and interpreted this ECG.   Date: 04/02/2015  EKG Time: 2:28 AM  Rate: 68  Rhythm: Normal sinus rhythm  Axis: Normal  Intervals: Normal  ST&T Change: None   ____________________________________________    RADIOLOGY  Imaging Results       DG Chest 2 View (Final result) Result time: 04/02/15 03:23:37   Final result by Rad Results In Interface (04/02/15 03:23:37)   Narrative:   CLINICAL DATA: 30 year old female with cough and fever  EXAM: CHEST 2  VIEW  COMPARISON: Radiograph dated 12/22/2013  FINDINGS: The heart size and mediastinal contours are within normal limits. Both lungs are clear. The visualized skeletal structures are unremarkable.  IMPRESSION: No active cardiopulmonary disease.   Electronically Signed By: Anner Crete M.D. On: 04/02/2015 03:23        ECG Results       INITIAL IMPRESSION / ASSESSMENT AND PLAN / ED COURSE  Pertinent labs & imaging results that were available during my care of the patient were reviewed by me and considered in my medical decision making (see chart for details).  History of physical exam consistent with possible acute sinusitis and an afebrile patient. Concern for possible palpitations secondary to Alka-Seltzer use tonight. Patient normal sinus rhythm without ectopy during her course of stay in the emergency department. Laboratory data revealed anemia consistent with the patient's Rivas hemoglobin 10.1 potassium of 3.2 which was repleted. Chest x-ray negative  ____________________________________________   FINAL CLINICAL IMPRESSION(S) / ED DIAGNOSES  Final diagnoses:  Acute maxillary sinusitis, recurrence not specified      Gregor Hams, MD 04/02/15 [phone]

## 2015-04-02 NOTE — Telephone Encounter (Signed)
Per chart review tab pt was seen at [facility] ED on 04/02/15.

## 2015-04-30 ENCOUNTER — Ambulatory Visit (INDEPENDENT_AMBULATORY_CARE_PROVIDER_SITE_OTHER): Payer: 59 | Admitting: Family Medicine

## 2015-04-30 ENCOUNTER — Encounter: Payer: Self-pay | Admitting: Family Medicine

## 2015-04-30 ENCOUNTER — Telehealth: Payer: Self-pay

## 2015-04-30 VITALS — BP 128/74 | HR 99 | Temp 98.7°F | Ht 64.0 in | Wt 179.6 lb

## 2015-04-30 DIAGNOSIS — K121 Other forms of stomatitis: Secondary | ICD-10-CM | POA: Diagnosis not present

## 2015-04-30 MED ORDER — TRIAMCINOLONE ACETONIDE 0.1 % MT PSTE: 1.0000 "application " | PASTE | Freq: Two times a day (BID) | OROMUCOSAL | Status: DC

## 2015-04-30 MED FILL — TRIAMCINOLONE 0.1% PASTE: 0.1 | 10 days supply | Qty: 5 | Fill #0

## 2015-04-30 NOTE — Assessment & Plan Note (Signed)
Small area of ulceration on the underside of her tongue with possible skin tag. No masses palpated in the area underneath her tongue. No drainage from the area. Discussed potential causes. No risk factors for oral cancer. Likely represents injury to the area. We will trial triamcinolone mouth paste. She'll follow-up in one week for recheck. If persists would consider referral to ENT for evaluation. Given return precautions.

## 2015-04-30 NOTE — Telephone Encounter (Signed)
PLEASE NOTE: All timestamps contained within this report are represented as [place] Standard Time. CONFIDENTIALTY NOTICE: This fax transmission is intended only for the addressee. It contains information that is legally privileged, confidential or otherwise protected from use or disclosure. If you are not the intended recipient, you are strictly prohibited from reviewing, disclosing, copying using or disseminating any of this information or taking any action in reliance on or regarding this information. If you have received this fax in error, please notify us immediately by telephone so that we can arrange for its return to [place]. Phone: [phone], Toll-Free: [phone], Fax: [phone] Page: 1 of 1 Call Id: [id number] [place] Night - Client Nonclinical Telephone Record [place] Night - Client Client Site [facility], [place] Type Call Who Is Calling Patient / Member / Family / Caregiver Caller Name Myrtie Cruise Phone Number [phone] Patient Name Union Rafanan Call Type Message Only Information Provided Reason for Call Request to Schedule Office Appointment Initial Comment Caller states his wife has 2 skin tag under her tongue, used orajel with no relief. She would like to know if she can be seen. Additional Comment Call Closed By: Starleen Arms Transaction Date/Time: 04/29/2015 7:28:02 PM (ET)

## 2015-04-30 NOTE — Telephone Encounter (Signed)
Pt already has appt on 04/30/15 to see Dr Caryl Bis.

## 2015-04-30 NOTE — Telephone Encounter (Signed)
pts husband confirmed [place] and Laela is same person.

## 2015-04-30 NOTE — Progress Notes (Signed)
Patient ID: SINEA CARCHI, female   DOB: [birth year], 30 y.o.   MRN: [id number]  Tommi Rumps, MD Phone: [phone]  Sarah Castillo is a 30 y.o. female who presents today for same-day visit.  Patient notes for the last week she has had an area of soreness underneath her tongue on the left aspect of the tongue. Notes it feels as though there is some extra skin. She notes some mild ear discomfort with this. No fevers. No new medications. She used Orajel and this is been beneficial. She does not think she bit her tongue. She's also tried ibuprofen. No smoking history. No history of alcohol abuse. No issues swallowing. Has had this previously on the opposite side of her tongue. No injury that she can think of. She does note she wears a mouthguard for her TMJ.  ROS see history of present illness  Objective  Physical Exam Filed Vitals:   04/30/15 1425  BP: 128/74  Pulse: 99  Temp: 98.7 F (37.1 C)    BP Readings from Last 3 Encounters:  04/30/15 128/74  04/02/15 123/90  02/06/15 96/70   Wt Readings from Last 3 Encounters:  04/30/15 179 lb 9.6 oz (81.466 kg)  04/02/15 176 lb (79.833 kg)  02/06/15 181 lb (82.101 kg)    Physical Exam  Constitutional: She is well-developed, well-nourished, and in no distress.  HENT:  Head: Normocephalic and atraumatic.  Right Ear: External ear normal.  Left Ear: External ear normal.  Mouth/Throat: Oropharynx is clear and moist. No oropharyngeal exudate.  Under side of tongue on left aspect with small area of ulceration with possible skin tag: This area is mildly tender to palpation, there is no mass or ulceration or swelling underneath the tongue, normal TMs bilaterally  Eyes: Conjunctivae are normal. Pupils are equal, round, and reactive to light.  Neck: Neck supple.  Cardiovascular: Normal rate, regular rhythm and normal heart sounds.   Pulmonary/Chest: Effort normal and breath sounds normal.  Lymphadenopathy:    She has no  cervical adenopathy.  Neurological: She is alert. Gait normal.  Skin: Skin is warm and dry. She is not diaphoretic.     Assessment/Plan: Please see individual problem list.  Mouth ulcer Small area of ulceration on the underside of her tongue with possible skin tag. No masses palpated in the area underneath her tongue. No drainage from the area. Discussed potential causes. No risk factors for oral cancer. Likely represents injury to the area. We will trial triamcinolone mouth paste. She'll follow-up in one week for recheck. If persists would consider referral to ENT for evaluation. Given return precautions.    No orders of the defined types were placed in this encounter.    Meds ordered this encounter  Medications  . DISCONTD: triamcinolone (KENALOG) 0.1 % paste    Sig: Use as directed 1 application in the mouth or throat 2 (two) times daily.    Dispense:  5 g    Refill:  0  . triamcinolone (KENALOG) 0.1 % paste    Sig: Use as directed 1 application in the mouth or throat 2 (two) times daily.    Dispense:  5 g    Refill:  0    Tommi Rumps, MD [place]

## 2015-04-30 NOTE — Progress Notes (Signed)
Pre visit review using our clinic review tool, if applicable. No additional management support is needed unless otherwise documented below in the visit note. 

## 2015-04-30 NOTE — Patient Instructions (Signed)
Nice to meet you. We're going to treat your area of irritation with the topical steroid for your tongue.  Please continue to monitor this area.  If you develop worsening pain, trouble swallowing, fevers, or any new or changing symptoms please seek medical attention.

## 2015-05-04 ENCOUNTER — Ambulatory Visit (INDEPENDENT_AMBULATORY_CARE_PROVIDER_SITE_OTHER): Payer: 59 | Admitting: Sports Medicine

## 2015-05-04 ENCOUNTER — Encounter: Payer: Self-pay | Admitting: Sports Medicine

## 2015-05-04 VITALS — Ht 65.0 in | Wt 176.0 lb

## 2015-05-04 DIAGNOSIS — M7918 Myalgia, other site: Secondary | ICD-10-CM

## 2015-05-04 DIAGNOSIS — M791 Myalgia: Secondary | ICD-10-CM

## 2015-05-04 DIAGNOSIS — M5137 Other intervertebral disc degeneration, lumbosacral region: Secondary | ICD-10-CM

## 2015-05-04 NOTE — Assessment & Plan Note (Signed)
Her myofascial pain complaints and issues with fatigue are important in limiting her ADLs  I think this may Relate to an abnormal sleep pattern that started when she first had issues with her low back pain  She basically has a chronic pain syndrome now that I would like to treat with medications once she is finished breast-feeding

## 2015-05-04 NOTE — Progress Notes (Signed)
Patient ID: Sarah Castillo, female   DOB: 01-01-1986, 30 y.o.   MRN: [id number]  Patient referred by husband Rema Jasmine, MD  CC: Chronic LBP  HPI Patient first experienced back pain in 2005 She had microdiscectomy in 2010 Prior to that she had confirmation of the L5-S1 disc disease with MRI in 2007 and her pattern was that she would get radiation to both legs  She first had the back pain was a stressful time as she was in college and her father died  She originally had done PT and other conservative measures but her pain never resolved  After her microdiscectomy she gets some relief of pain but this gradually returned In 2014 fusion of L5-S1  She has not developed leg weakness, foot drop or bowel and bladder symptoms  However, her pain pattern has persisted ever since her original back pain With fluctuating levels of intensity She was pregnant this past year and within the first trimester she began having more back pain Ibuprofen helps somewhat She has done some exercises in the past which helped for a period time  Social history: college graduate with a psychology degree/ lives with husband and 55 month old child/ nonsmoker  Past Hx:  Lots of medical issues reviewed/ key issues include possible chronic fatigue issues/ possible anxiety stress disorder Documented in notes by DR [employer] PCOS  Review of systems There are pain pattern also extends into her cervical spine When she lies down to sleep at night she will get numbness in hands She gets tightness in her upper shoulders  History of TMJ type pain  bite guard may be somewhat helpful  History of bilateral arch and foot pain  Physical exam Pleasant female in no acute distress Ht 5\' 5"  (1.651 m)  Wt 176 lb (79.833 kg)  BMI 29.29 kg/m2  Breastfeeding? No  Neck reveals full range of motion Spasm in the trapezius muscles bilaterally Strength testing of shoulders and the C5-T1 levels is normal  Jaw closure looks  normal with some mild clicking of the left TMJ  SI joints move normally bilaterally She does have anterior pelvic tilt  Straight leg raises are negative bilaterally Heel walk/ toe walk/ tandem walk/ lateral foot walk and crossover are normal  Strength testing in the lower extremities reveals good strength

## 2015-05-04 NOTE — Assessment & Plan Note (Signed)
I think she has basically a degenerative condition in her low back from the chronic disc disease I feel we need to get her back onto exercises for the low back Regular aerobic activity that starts in a very light level Try to achieve better sleep pattern with amitriptyline 25 at night I would start her on tramadol twice a day for more significant pain and supplement Motrin  We will start with home exercises and add medications once she is no longer breast-feeding

## 2015-05-04 NOTE — Patient Instructions (Signed)
Neck - 3 strategies Isometrics - hold for a count of 10 X 5 - 4 cardinal planes Twice daily trapezius shake outs - 30 secs. X 2 T and H stretch (add a pelvic tilt) - 30 secs. X 3 for each  Do back series of 3 exercises - do 5 repeats and hold for 5 breaths  Finally start a walking program of at least 10 mins twice daily

## 2015-06-15 ENCOUNTER — Ambulatory Visit (INDEPENDENT_AMBULATORY_CARE_PROVIDER_SITE_OTHER): Payer: 59 | Admitting: Family Medicine

## 2015-06-15 ENCOUNTER — Encounter: Payer: Self-pay | Admitting: Family Medicine

## 2015-06-15 VITALS — BP 108/68 | HR 86 | Temp 99.0°F | Ht 64.0 in | Wt 181.0 lb

## 2015-06-15 DIAGNOSIS — D62 Acute posthemorrhagic anemia: Secondary | ICD-10-CM

## 2015-06-15 DIAGNOSIS — R1030 Lower abdominal pain, unspecified: Secondary | ICD-10-CM | POA: Diagnosis not present

## 2015-06-15 LAB — POCT URINE PREGNANCY: Preg Test, Ur: NEGATIVE

## 2015-06-15 LAB — POC URINALSYSI DIPSTICK (AUTOMATED)
BILIRUBIN UA: NEGATIVE
Glucose, UA: NEGATIVE
KETONES UA: NEGATIVE
NITRITE UA: NEGATIVE
PH UA: 6
PROTEIN UA: NEGATIVE
RBC UA: NEGATIVE
Spec Grav, UA: <=1.005
Urobilinogen, UA: 0.2

## 2015-06-15 NOTE — Patient Instructions (Signed)
Labs today  Stop at check out to get urgent referral to your gyn practice  If symptoms suddenly worsen go to the ED  Drink more fluids to prevent constipation as well  Go back to metamucil if it has helped in the past

## 2015-06-15 NOTE — Assessment & Plan Note (Signed)
Pt c/o pain around CS incision and over bladder with clear ua and neg pregnancy test  She is worried about an ovarian cyst -has had in the past Currently nursing  Lab today  Will ref to her gyn practice for an urgent visit due to pain -will likely need an ultrasound

## 2015-06-15 NOTE — Progress Notes (Signed)
Subjective:    Patient ID: Sarah Castillo, female    DOB: July 31, 1985, 30 y.o.   MRN: [id number]  HPI  Here for a month of lower abdominal pain   Wakes up with low abd pain and cramping /discomfort /nausea  Sometimes vomiting and light headedness for 1-2 hours  Sharp pain worse on the R side of lower abd  Felt a lot of pressure when moving bowels or urinating   Yesterday it made it hard to carry the baby  Back is hurting too   Some constipation  Stopped her iron   Does have hx of ibs  Bloated Takes gas x twice daily -not helping much    May have passed a kidney stone in the past   No hx of gallstones   Has not told gyn about it  Did have a CS Is tender over scar   Is breast feeding  Is having periods    Results for orders placed or performed in visit on 06/15/15  POCT Urinalysis Dipstick (Automated)  Result Value Ref Range   Color, UA Light Yellow    Clarity, UA Clear    Glucose, UA Negative    Bilirubin, UA Negative    Ketones, UA Negative    Spec Grav, UA <=1.005    Blood, UA Negative    pH, UA 6.0    Protein, UA Negative    Urobilinogen, UA 0.2    Nitrite, UA Negative    Leukocytes, UA Trace (A) Negative  POCT urine pregnancy  Result Value Ref Range   Preg Test, Ur Negative Negative     Patient Active Problem List   Diagnosis Date Noted  . Lower abdominal pain 06/15/2015  . Degeneration of lumbar or lumbosacral intervertebral disc 05/04/2015  . Mouth ulcer 04/30/2015  . Temporomandibular joint (TMJ) pain 01/01/2015  . Postoperative anemia due to acute blood loss 01/01/2015  . Tingling in extremities 01/01/2015  . S/P C-section 12/16/2014  . Fatigue 03/25/2014  . Rash and nonspecific skin eruption 03/25/2014  . Nausea without vomiting 02/17/2014  . Breast pain 02/17/2014  . Myofascial pain 12/24/2013  . Tachycardia 12/24/2013  . PCOS (polycystic ovarian syndrome) 11/25/2013  . Adjustment disorder with mixed anxiety and depressed mood  11/25/2013  . Allergic rhinitis 11/25/2013  . GERD (gastroesophageal reflux disease) 11/25/2013  . IBS (irritable bowel syndrome) 11/25/2013  . Left knee pain 11/06/2013  . S/P lumbar fusion 11/06/2013   Past Medical History  Diagnosis Date  . Anxiety   . Depression   . PCOS (polycystic ovarian syndrome)   . IBS (irritable bowel syndrome)   . Allergy   . Diabetes mellitus without complication ([place])   . Gestational diabetes mellitus (GDM), antepartum   . Acute blood loss anemia 01/01/2015   Past Surgical History  Procedure Laterality Date  . Lumbar fusion  12/2012    L5S1  . Lumbar disc surgery  09/2007    L5S1  . Lasik  2012  . Colonoscopy w/ biopsies  01/19/09    normal  . Esophagogastroduodenoscopy  08/03/08    mild gastritis and HH  . Cesarean section N/A 12/16/2014    Procedure: CESAREAN SECTION;  Surgeon: Crawford Givens, MD;  Location: [facility];  Service: Obstetrics;  Laterality: N/A;   Social History  Substance Use Topics  . Smoking status: Never Smoker   . Smokeless tobacco: Never Used  . Alcohol Use: No   Family History  Problem Relation Age of Onset  . Colon  cancer Cousin   . Diabetes Paternal Grandmother   . Diabetes Paternal Grandfather   . Diabetes Maternal Grandmother   . Diabetes Maternal Grandfather   . Heart attack Father   . Hypertension Father   . Hyperlipidemia Father   . Hypertension Mother   . Thyroid disease Mother   . Hyperlipidemia Mother    Allergies  Allergen Reactions  . Metformin And Related Diarrhea    GI side eff   . Zoloft [Sertraline] Diarrhea  . Hydromorphone Rash    Other reaction(s): Other (See Comments) Other Reaction: FEVER  . Morphine Rash and Other (See Comments)    Other Reaction: FEVER   Current Outpatient Prescriptions on File Prior to Visit  Medication Sig Dispense Refill  . ARTIFICIAL TEAR OP Apply 1 drop to eye as needed (dry eyes).    . Cholecalciferol (VITAMIN D3 PO) Take 4,000 Units by mouth daily.    .  fluticasone (FLONASE) 50 MCG/ACT nasal spray Place into both nostrils daily.    Marland Kitchen ibuprofen (ADVIL,MOTRIN) 600 MG tablet Take 1 tablet (600 mg total) by mouth every 6 (six) hours as needed for mild pain. 30 tablet 2  . loratadine (CLARITIN) 10 MG tablet Take 10 mg by mouth at bedtime.     Marland Kitchen omeprazole (PRILOSEC) 20 MG capsule Take 1 capsule (20 mg total) by mouth 2 (two) times daily before a meal. (Patient taking differently: Take 20 mg by mouth daily. ) 180 capsule 3  . Prenatal Vit-Fe Fumarate-FA (PRENATAL MULTIVITAMIN) TABS tablet Take 1 tablet by mouth daily at 12 noon.    . ferrous sulfate (IRON SUPPLEMENT) 325 (65 FE) MG tablet Take 1 tablet (325 mg total) by mouth 2 (two) times daily with a meal. For 14 days, then once daily for 28 days. (Patient not taking: Reported on 06/15/2015) 45 tablet 2   No current facility-administered medications on file prior to visit.     Review of Systems Review of Systems  Constitutional: Negative for fever, appetite change, fatigue and unexpected weight change.  Eyes: Negative for pain and visual disturbance.  Respiratory: Negative for cough and shortness of breath.   Cardiovascular: Negative for cp or palpitations    Gastrointestinal: Negative for nausea, diarrhea and pos for constipation Neg for blood in stool or dark stool Genitourinary: Negative for urgency and frequency. neg for dysuria and pos for feeling of bladder pressure neg for vaginal d/c or abn vaginal bleeding Skin: Negative for pallor or rash   Neurological: Negative for weakness, light-headedness, numbness and headaches.  Hematological: Negative for adenopathy. Does not bruise/bleed easily.  Psychiatric/Behavioral: Negative for dysphoric mood. The patient is not nervous/anxious.         Objective:   Physical Exam  Constitutional: She appears well-developed and well-nourished. No distress.  overwt and well app  HENT:  Head: Normocephalic and atraumatic.  Mouth/Throat: Oropharynx is  clear and moist.  Eyes: Conjunctivae and EOM are normal. Pupils are equal, round, and reactive to light. No scleral icterus.  Neck: Normal range of motion. Neck supple.  Cardiovascular: Normal rate, regular rhythm and normal heart sounds.   Pulmonary/Chest: Effort normal and breath sounds normal. No respiratory distress. She has no wheezes. She has no rales.  Abdominal: Soft. Bowel sounds are normal. She exhibits no distension, no abdominal bruit, no pulsatile midline mass and no mass. There is no hepatosplenomegaly. There is tenderness in the suprapubic area. There is no rigidity, no rebound, no guarding, no CVA tenderness, no tenderness at McBurney's point  and negative Murphy's sign.  Tender suprapubic area -no rebound or guarding No M felt Tender over incision area and just to the R of it    Musculoskeletal: She exhibits no edema.  Lymphadenopathy:    She has no cervical adenopathy.  Neurological: She is alert.  Skin: Skin is warm and dry. No rash noted. No erythema. No pallor.  Psychiatric: She has a normal mood and affect.          Assessment & Plan:   Problem List Items Addressed This Visit      Other   Postoperative anemia due to acute blood loss    Re check cbc today Pt stopped her iron after baby was born due to constipation  Also fatigue and pelvic pain           Relevant Orders   CBC with Differential/Platelet   Ferritin   Lower abdominal pain - Primary    Pt c/o pain around CS incision and over bladder with clear ua and neg pregnancy test  She is worried about an ovarian cyst -has had in the past Currently nursing  Lab today  Will ref to her gyn practice for an urgent visit due to pain -will likely need an ultrasound       Relevant Orders   POCT Urinalysis Dipstick (Automated) (Completed)   POCT urine pregnancy (Completed)   Comprehensive metabolic panel   CBC with Differential/Platelet   Ambulatory referral to Gynecology

## 2015-06-15 NOTE — Progress Notes (Signed)
Pre visit review using our clinic review tool, if applicable. No additional management support is needed unless otherwise documented below in the visit note. 

## 2015-06-15 NOTE — Assessment & Plan Note (Signed)
Re check cbc today Pt stopped her iron after baby was born due to constipation  Also fatigue and pelvic pain

## 2015-06-16 LAB — COMPREHENSIVE METABOLIC PANEL
ALK PHOS: 66 U/L (ref 39–117)
ALT: 28 U/L (ref 0–35)
AST: 19 U/L (ref 0–37)
Albumin: 4.3 g/dL (ref 3.5–5.2)
BILIRUBIN TOTAL: 0.4 mg/dL (ref 0.2–1.2)
BUN: 10 mg/dL (ref 6–23)
CO2: 25 meq/L (ref 19–32)
CREATININE: 0.72 mg/dL (ref 0.40–1.20)
Calcium: 9.3 mg/dL (ref 8.4–10.5)
Chloride: 105 mEq/L (ref 96–112)
GFR: 102.14 mL/min (ref >60.00)
GLUCOSE: 84 mg/dL (ref 70–99)
Potassium: 4 mEq/L (ref 3.5–5.1)
Sodium: 137 mEq/L (ref 135–145)
Total Protein: 7.5 g/dL (ref 6.0–8.3)

## 2015-06-16 LAB — CBC WITH DIFFERENTIAL/PLATELET
BASOS ABS: 0 10*3/uL (ref 0.0–0.1)
Basophils Relative: 0.4 % (ref 0.0–3.0)
EOS ABS: 0.3 10*3/uL (ref 0.0–0.7)
Eosinophils Relative: 3.9 % (ref 0.0–5.0)
HCT: 32.5 % — ABNORMAL LOW (ref 36.0–46.0)
Hemoglobin: 10.4 g/dL — ABNORMAL LOW (ref 12.0–15.0)
LYMPHS ABS: 2.4 10*3/uL (ref 0.7–4.0)
Lymphocytes Relative: 29.4 % (ref 12.0–46.0)
MCHC: 31.9 g/dL (ref 30.0–36.0)
MONO ABS: 0.5 10*3/uL (ref 0.1–1.0)
MONOS PCT: 5.9 % (ref 3.0–12.0)
NEUTROS ABS: 4.8 10*3/uL (ref 1.4–7.7)
NEUTROS PCT: 60.4 % (ref 43.0–77.0)
PLATELETS: 320 10*3/uL (ref 150.0–400.0)
RBC: 4.92 Mil/uL (ref 3.87–5.11)
RDW: 17.5 % — ABNORMAL HIGH (ref 11.5–15.5)
WBC: 8 10*3/uL (ref 4.0–10.5)

## 2015-06-16 LAB — FERRITIN: FERRITIN: 5.4 ng/mL — AB (ref 10.0–291.0)

## 2015-06-17 ENCOUNTER — Ambulatory Visit
Admission: RE | Admit: 2015-06-17 | Discharge: 2015-06-17 | Disposition: A | Discharge status: Home / Self Care | Payer: 59 | Source: Ambulatory Visit | Attending: Obstetrics and Gynecology | Admitting: Obstetrics and Gynecology

## 2015-06-17 ENCOUNTER — Other Ambulatory Visit: Payer: Self-pay | Admitting: Obstetrics and Gynecology

## 2015-06-17 ENCOUNTER — Telehealth: Payer: Self-pay | Admitting: *Deleted

## 2015-06-17 DIAGNOSIS — R110 Nausea: Secondary | ICD-10-CM | POA: Diagnosis not present

## 2015-06-17 DIAGNOSIS — R1031 Right lower quadrant pain: Secondary | ICD-10-CM

## 2015-06-17 DIAGNOSIS — R1030 Lower abdominal pain, unspecified: Secondary | ICD-10-CM | POA: Diagnosis not present

## 2015-06-17 DIAGNOSIS — N898 Other specified noninflammatory disorders of vagina: Secondary | ICD-10-CM | POA: Diagnosis not present

## 2015-06-17 DIAGNOSIS — R102 Pelvic and perineal pain: Secondary | ICD-10-CM | POA: Diagnosis not present

## 2015-06-17 MED ORDER — IOPAMIDOL (ISOVUE-300) INJECTION 61%
150.0000 mL | Freq: Once | INTRAVENOUS | Status: AC | PRN
  Administered 2015-06-17: 150 mL via INTRAVENOUS

## 2015-06-17 MED ORDER — POLYSACCHARIDE IRON COMPLEX 150 MG PO CAPS: 150.0000 mg | ORAL_CAPSULE | Freq: Every day | ORAL | Status: DC

## 2015-06-17 NOTE — Telephone Encounter (Signed)
-----   Message from Abner Greenspan, MD sent at 06/17/2015 12:38 AM EDT ----- Please call in nu iron 150 mg and take 1 po qd #30 3 ref  Re check cbc and ferritin in 3 mo  Can take stool softener for constipation Hope this brand will not constipate like the iron sulfate did

## 2015-06-17 NOTE — Telephone Encounter (Signed)
Rx sent to pharmacy and pt advise of labs and Dr. Marliss Coots comments. Lab appt scheduled

## 2015-06-22 ENCOUNTER — Encounter: Payer: Self-pay | Admitting: *Deleted

## 2015-06-22 ENCOUNTER — Ambulatory Visit: Payer: 59 | Admitting: Family Medicine

## 2015-06-22 DIAGNOSIS — Z0289 Encounter for other administrative examinations: Secondary | ICD-10-CM

## 2015-06-25 ENCOUNTER — Other Ambulatory Visit: Payer: Self-pay | Admitting: Family Medicine

## 2015-06-30 ENCOUNTER — Institutional Professional Consult (permissible substitution): Payer: 59 | Admitting: Family Medicine

## 2015-06-30 ENCOUNTER — Telehealth: Payer: Self-pay | Admitting: Family Medicine

## 2015-06-30 DIAGNOSIS — Z0289 Encounter for other administrative examinations: Secondary | ICD-10-CM

## 2015-06-30 NOTE — Telephone Encounter (Signed)
Patient did not come for their scheduled appointment today for back pain per dr tower  Please let me know if the patient needs to be contacted immediately for follow up or if no follow up is necessary.

## 2015-06-30 NOTE — Telephone Encounter (Signed)
No f/u needed 

## 2015-08-17 DIAGNOSIS — D229 Melanocytic nevi, unspecified: Secondary | ICD-10-CM | POA: Diagnosis not present

## 2015-08-27 MED FILL — LIDOCAINE-PRILOCAINE CREAM: 2.5-2.5 | 20 days supply | Qty: 30 | Fill #0

## 2015-09-09 DIAGNOSIS — M5416 Radiculopathy, lumbar region: Secondary | ICD-10-CM | POA: Diagnosis not present

## 2015-09-09 DIAGNOSIS — M5417 Radiculopathy, lumbosacral region: Secondary | ICD-10-CM | POA: Diagnosis not present

## 2015-09-09 DIAGNOSIS — M545 Low back pain: Secondary | ICD-10-CM | POA: Diagnosis not present

## 2015-09-09 DIAGNOSIS — M9903 Segmental and somatic dysfunction of lumbar region: Secondary | ICD-10-CM | POA: Diagnosis not present

## 2015-09-09 DIAGNOSIS — M5412 Radiculopathy, cervical region: Secondary | ICD-10-CM | POA: Diagnosis not present

## 2015-09-09 DIAGNOSIS — M9904 Segmental and somatic dysfunction of sacral region: Secondary | ICD-10-CM | POA: Diagnosis not present

## 2015-09-09 DIAGNOSIS — M9901 Segmental and somatic dysfunction of cervical region: Secondary | ICD-10-CM | POA: Diagnosis not present

## 2015-09-09 DIAGNOSIS — M5413 Radiculopathy, cervicothoracic region: Secondary | ICD-10-CM | POA: Diagnosis not present

## 2015-09-09 DIAGNOSIS — M542 Cervicalgia: Secondary | ICD-10-CM | POA: Diagnosis not present

## 2015-09-14 DIAGNOSIS — M9901 Segmental and somatic dysfunction of cervical region: Secondary | ICD-10-CM | POA: Diagnosis not present

## 2015-09-14 DIAGNOSIS — M9903 Segmental and somatic dysfunction of lumbar region: Secondary | ICD-10-CM | POA: Diagnosis not present

## 2015-09-14 DIAGNOSIS — M542 Cervicalgia: Secondary | ICD-10-CM | POA: Diagnosis not present

## 2015-09-14 DIAGNOSIS — M545 Low back pain: Secondary | ICD-10-CM | POA: Diagnosis not present

## 2015-09-14 DIAGNOSIS — M5412 Radiculopathy, cervical region: Secondary | ICD-10-CM | POA: Diagnosis not present

## 2015-09-14 DIAGNOSIS — M9904 Segmental and somatic dysfunction of sacral region: Secondary | ICD-10-CM | POA: Diagnosis not present

## 2015-09-14 DIAGNOSIS — M5413 Radiculopathy, cervicothoracic region: Secondary | ICD-10-CM | POA: Diagnosis not present

## 2015-09-14 DIAGNOSIS — M5417 Radiculopathy, lumbosacral region: Secondary | ICD-10-CM | POA: Diagnosis not present

## 2015-09-14 DIAGNOSIS — M5416 Radiculopathy, lumbar region: Secondary | ICD-10-CM | POA: Diagnosis not present

## 2015-09-15 ENCOUNTER — Other Ambulatory Visit: Payer: Self-pay

## 2015-09-16 DIAGNOSIS — M9903 Segmental and somatic dysfunction of lumbar region: Secondary | ICD-10-CM | POA: Diagnosis not present

## 2015-09-16 DIAGNOSIS — M5412 Radiculopathy, cervical region: Secondary | ICD-10-CM | POA: Diagnosis not present

## 2015-09-16 DIAGNOSIS — M5413 Radiculopathy, cervicothoracic region: Secondary | ICD-10-CM | POA: Diagnosis not present

## 2015-09-16 DIAGNOSIS — M9901 Segmental and somatic dysfunction of cervical region: Secondary | ICD-10-CM | POA: Diagnosis not present

## 2015-09-16 DIAGNOSIS — M545 Low back pain: Secondary | ICD-10-CM | POA: Diagnosis not present

## 2015-09-16 DIAGNOSIS — M542 Cervicalgia: Secondary | ICD-10-CM | POA: Diagnosis not present

## 2015-09-16 DIAGNOSIS — M5417 Radiculopathy, lumbosacral region: Secondary | ICD-10-CM | POA: Diagnosis not present

## 2015-09-16 DIAGNOSIS — M5416 Radiculopathy, lumbar region: Secondary | ICD-10-CM | POA: Diagnosis not present

## 2015-09-16 DIAGNOSIS — M9904 Segmental and somatic dysfunction of sacral region: Secondary | ICD-10-CM | POA: Diagnosis not present

## 2015-09-22 ENCOUNTER — Ambulatory Visit (INDEPENDENT_AMBULATORY_CARE_PROVIDER_SITE_OTHER): Payer: 59 | Admitting: Family Medicine

## 2015-09-22 ENCOUNTER — Telehealth: Payer: Self-pay | Admitting: *Deleted

## 2015-09-22 ENCOUNTER — Other Ambulatory Visit: Payer: Self-pay

## 2015-09-22 ENCOUNTER — Encounter: Payer: Self-pay | Admitting: Family Medicine

## 2015-09-22 VITALS — BP 116/76 | HR 80 | Temp 98.8°F | Wt 179.2 lb

## 2015-09-22 DIAGNOSIS — Z7189 Other specified counseling: Secondary | ICD-10-CM | POA: Diagnosis not present

## 2015-09-22 DIAGNOSIS — M7918 Myalgia, other site: Secondary | ICD-10-CM

## 2015-09-22 DIAGNOSIS — M791 Myalgia: Secondary | ICD-10-CM | POA: Diagnosis not present

## 2015-09-22 DIAGNOSIS — R938 Abnormal findings on diagnostic imaging of other specified body structures: Secondary | ICD-10-CM

## 2015-09-22 DIAGNOSIS — IMO0002 Reserved for concepts with insufficient information to code with codable children: Secondary | ICD-10-CM

## 2015-09-22 DIAGNOSIS — R9389 Abnormal findings on diagnostic imaging of other specified body structures: Secondary | ICD-10-CM

## 2015-09-22 DIAGNOSIS — Z23 Encounter for immunization: Secondary | ICD-10-CM | POA: Diagnosis not present

## 2015-09-22 DIAGNOSIS — E559 Vitamin D deficiency, unspecified: Secondary | ICD-10-CM

## 2015-09-22 DIAGNOSIS — D509 Iron deficiency anemia, unspecified: Secondary | ICD-10-CM

## 2015-09-22 MED ORDER — ERGOCALCIFEROL 1.25 MG (50000 UT) PO CAPS: 50000.0000 [IU] | ORAL_CAPSULE | ORAL | 0 refills | Status: DC

## 2015-09-22 NOTE — Progress Notes (Signed)
Subjective:    Patient ID: Sarah Castillo, female    DOB: [birth year], 30 y.o.   MRN: [id number]  HPI Here for back problems  She started going to a chiropractor for her "all over body pain" Did labs at [facility] -we do not have yet  Also on xray- ? If aorta is large   Not sure if this is helping yet  Disc fibromyalgia  ? Poss starting cymbalta - would consider after done breastfeeding     Had a CT abdomen several months ago - noted aorta looked normal in June Did not mention any calcium deposit   Hx of microdiscectomy and fusion at L5-S1  Going to [place] soon  Needs a flu shot - will get that today  Had her Tdap   Needs to re start your vitamin D    Patient Active Problem List   Diagnosis Date Noted  . Advice or immunization for travel 09/23/2015  . Abnormal x-ray 09/23/2015  . Vitamin D deficiency 09/23/2015  . Lower abdominal pain 06/15/2015  . Degeneration of lumbar or lumbosacral intervertebral disc 05/04/2015  . Mouth ulcer 04/30/2015  . Temporomandibular joint (TMJ) pain 01/01/2015  . Postoperative anemia due to acute blood loss 01/01/2015  . Tingling in extremities 01/01/2015  . S/P C-section 12/16/2014  . Fatigue 03/25/2014  . Rash and nonspecific skin eruption 03/25/2014  . Nausea without vomiting 02/17/2014  . Breast pain 02/17/2014  . Myofascial pain 12/24/2013  . Tachycardia 12/24/2013  . PCOS (polycystic ovarian syndrome) 11/25/2013  . Adjustment disorder with mixed anxiety and depressed mood 11/25/2013  . Allergic rhinitis 11/25/2013  . GERD (gastroesophageal reflux disease) 11/25/2013  . IBS (irritable bowel syndrome) 11/25/2013  . Left knee pain 11/06/2013  . S/P lumbar fusion 11/06/2013   Past Medical History:  Diagnosis Date  . Acute blood loss anemia 01/01/2015  . Allergy   . Anxiety   . Depression   . Diabetes mellitus without complication ([place])   . Gestational diabetes mellitus (GDM), antepartum   . IBS (irritable bowel  syndrome)   . PCOS (polycystic ovarian syndrome)    Past Surgical History:  Procedure Laterality Date  . CESAREAN SECTION N/A 12/16/2014   Procedure: CESAREAN SECTION;  Surgeon: Crawford Givens, MD;  Location: [facility];  Service: Obstetrics;  Laterality: N/A;  . COLONOSCOPY W/ BIOPSIES  01/19/09   normal  . ESOPHAGOGASTRODUODENOSCOPY  08/03/08   mild gastritis and HH  . LASIK  2012  . LUMBAR DISC SURGERY  09/2007   L5S1  . LUMBAR FUSION  12/2012   L5S1   Social History  Substance Use Topics  . Smoking status: Never Smoker  . Smokeless tobacco: Never Used  . Alcohol use No   Family History  Problem Relation Age of Onset  . Heart attack Father   . Hypertension Father   . Hyperlipidemia Father   . Colon cancer Cousin   . Diabetes Paternal Grandmother   . Diabetes Paternal Grandfather   . Diabetes Maternal Grandmother   . Diabetes Maternal Grandfather   . Hypertension Mother   . Thyroid disease Mother   . Hyperlipidemia Mother    Allergies  Allergen Reactions  . Metformin And Related Diarrhea    GI side eff   . Zoloft [Sertraline] Diarrhea  . Hydromorphone Rash    Other reaction(s): Other (See Comments) Other Reaction: FEVER  . Morphine Rash and Other (See Comments)    Other Reaction: FEVER   Current Outpatient Prescriptions on File Prior  to Visit  Medication Sig Dispense Refill  . ARTIFICIAL TEAR OP Apply 1 drop to eye as needed (dry eyes).    . Cholecalciferol (VITAMIN D3 PO) Take 4,000 Units by mouth daily.    . ferrous sulfate (IRON SUPPLEMENT) 325 (65 FE) MG tablet Take 1 tablet (325 mg total) by mouth 2 (two) times daily with a meal. For 14 days, then once daily for 28 days. 45 tablet 2  . ibuprofen (ADVIL,MOTRIN) 600 MG tablet Take 1 tablet (600 mg total) by mouth every 6 (six) hours as needed for mild pain. 30 tablet 2  . iron polysaccharides (NU-IRON) 150 MG capsule Take 1 capsule (150 mg total) by mouth daily. 30 capsule 3  . loratadine (CLARITIN) 10 MG tablet  Take 10 mg by mouth at bedtime.     Marland Kitchen omeprazole (PRILOSEC) 20 MG capsule TAKE 1 CAPSULE BY MOUTH 2 TIMES DAILY BEFORE A MEAL. 180 capsule 1  . Prenatal Vit-Fe Fumarate-FA (PRENATAL MULTIVITAMIN) TABS tablet Take 1 tablet by mouth daily at 12 noon.    . fluticasone (FLONASE) 50 MCG/ACT nasal spray Place into both nostrils daily.     No current facility-administered medications on file prior to visit.     Review of Systems Review of Systems  Constitutional: Negative for fever, appetite change,  and unexpected weight change. pos for fatigue  Eyes: Negative for pain and visual disturbance.  Respiratory: Negative for cough and shortness of breath.   Cardiovascular: Negative for cp or palpitations    Gastrointestinal: Negative for nausea, diarrhea and constipation.  Genitourinary: Negative for urgency and frequency.  Skin: Negative for pallor or rash   MSK pos for chronic myofascial pain /worse in back  Neurological: Negative for weakness, light-headedness, numbness and headaches.  Hematological: Negative for adenopathy. Does not bruise/bleed easily.  Psychiatric/Behavioral: Negative for dysphoric mood. Pos for anxious mood          Objective:   Physical Exam  Constitutional: She appears well-developed and well-nourished. No distress.  overwt and well appearing    HENT:  Head: Normocephalic and atraumatic.  Mouth/Throat: Oropharynx is clear and moist.  Eyes: Conjunctivae and EOM are normal. Pupils are equal, round, and reactive to light.  Neck: Normal range of motion. Neck supple. No JVD present. Carotid bruit is not present. No thyromegaly present.  Cardiovascular: Normal rate, regular rhythm, normal heart sounds and intact distal pulses.  Exam reveals no gallop.   Pulmonary/Chest: Effort normal and breath sounds normal. No respiratory distress. She has no wheezes. She has no rales.  No crackles  Abdominal: Soft. Bowel sounds are normal. She exhibits no distension, no abdominal bruit  and no mass. There is no tenderness.  Musculoskeletal: She exhibits no edema.  Limited rom of LS and TS Tender myofasical trigger points   Lymphadenopathy:    She has no cervical adenopathy.  Neurological: She is alert. She has normal reflexes.  Skin: Skin is warm and dry. No rash noted. No pallor.  Psychiatric: She has a normal mood and affect.  Pleasant/ mildly anxious Supportive husband present as well as her 38 mo old baby           Assessment & Plan:   Problem List Items Addressed This Visit      Other   Vitamin D deficiency    Pt has hx of low D- she was originally px high dose tx for 12 weeks-but never finished it  Pending level from recent draw at chiropractor office  Px ergocalciferol  50,000 iu weekly for 12 weeks  Continue to follow  Disc need for daily dosing after this as well as balanced diet  Pt does take PPI Disc imp of D to bone and overall health      Myofascial pain    Pt continues to suffer from chronic pain (also anxiety)  She wants to consider cymbalta in the future  Advised not to begin until after she is done breast feeding- she plans to wean relatively soon  Will update when ready to start it   Recent lab profile for his and fatigue also done by chiropractor is en route for me to review       Iron deficiency anemia    Ongoing/worse since her baby was delivered  Lab Results  Component Value Date   WBC 8.0 06/15/2015   HGB 10.4 (L) 06/15/2015   HCT 32.5 (L) 06/15/2015   MCV 66.1 Repeated and verified X2. (L) 06/15/2015   PLT 320.0 06/15/2015   Was re checked by chiropractor-pending labs  She continues to take iron       Advice or immunization for travel    Pt is due to travel to [place] soon- given flu shot as well as hep A/B vaccines as well as flu shot   She will disc/recieve other necessary immunizations at the health dept        Abnormal x-ray    Pt reports her xray from a chiropractor visit noted an enlarged aortic silhouette  (she does not have a report) Reviewed her recent CT of abd and pelvis-with nl appearing aorta from June- very re assuring  Also no note of atherosclerosis  No symptoms  Will continue to follow - printed copy of Ct for her to take to chiropractor and req a copy of the original xray report to [place]       Other Visit Diagnoses    Need for influenza vaccination    -  Primary   Relevant Orders   Flu Vaccine QUAD 36+ mos PF IM (Fluarix & Fluzone Quad PF) (Completed)   Need for hepatitis A and B vaccination       Relevant Orders   Hepatitis A hepatitis B combined vaccine IM (Completed)

## 2015-09-22 NOTE — Progress Notes (Signed)
Pre visit review using our clinic review tool, if applicable. No additional management support is needed unless otherwise documented below in the visit note. 

## 2015-09-22 NOTE — Patient Instructions (Addendum)
Hepatitis A and B vaccines today  Flu shot today  Other vaccines you have to get through the health department  I will be on the look out for

## 2015-09-22 NOTE — Telephone Encounter (Signed)
Patient asked for a Rx of voltaren gel. She said you've never prescribed this, but she has used this before and would like a refill.

## 2015-09-23 DIAGNOSIS — D509 Iron deficiency anemia, unspecified: Secondary | ICD-10-CM | POA: Insufficient documentation

## 2015-09-23 DIAGNOSIS — M5417 Radiculopathy, lumbosacral region: Secondary | ICD-10-CM | POA: Diagnosis not present

## 2015-09-23 DIAGNOSIS — IMO0002 Reserved for concepts with insufficient information to code with codable children: Secondary | ICD-10-CM | POA: Insufficient documentation

## 2015-09-23 DIAGNOSIS — M542 Cervicalgia: Secondary | ICD-10-CM | POA: Diagnosis not present

## 2015-09-23 DIAGNOSIS — E559 Vitamin D deficiency, unspecified: Secondary | ICD-10-CM | POA: Insufficient documentation

## 2015-09-23 DIAGNOSIS — M9901 Segmental and somatic dysfunction of cervical region: Secondary | ICD-10-CM | POA: Diagnosis not present

## 2015-09-23 DIAGNOSIS — M545 Low back pain: Secondary | ICD-10-CM | POA: Diagnosis not present

## 2015-09-23 DIAGNOSIS — M5413 Radiculopathy, cervicothoracic region: Secondary | ICD-10-CM | POA: Diagnosis not present

## 2015-09-23 DIAGNOSIS — M9903 Segmental and somatic dysfunction of lumbar region: Secondary | ICD-10-CM | POA: Diagnosis not present

## 2015-09-23 DIAGNOSIS — R9389 Abnormal findings on diagnostic imaging of other specified body structures: Secondary | ICD-10-CM | POA: Insufficient documentation

## 2015-09-23 DIAGNOSIS — M5416 Radiculopathy, lumbar region: Secondary | ICD-10-CM | POA: Diagnosis not present

## 2015-09-23 DIAGNOSIS — M5412 Radiculopathy, cervical region: Secondary | ICD-10-CM | POA: Diagnosis not present

## 2015-09-23 DIAGNOSIS — M9904 Segmental and somatic dysfunction of sacral region: Secondary | ICD-10-CM | POA: Diagnosis not present

## 2015-09-23 MED ORDER — DICLOFENAC SODIUM 1 % TD GEL: 2.0000 g | Freq: Four times a day (QID) | TRANSDERMAL | 1 refills | Status: DC

## 2015-09-23 NOTE — Assessment & Plan Note (Signed)
Ongoing/worse since her baby was delivered  Lab Results  Component Value Date   WBC 8.0 06/15/2015   HGB 10.4 (L) 06/15/2015   HCT 32.5 (L) 06/15/2015   MCV 66.1 Repeated and verified X2. (L) 06/15/2015   PLT 320.0 06/15/2015   Was re checked by chiropractor-pending labs  She continues to take iron

## 2015-09-23 NOTE — Assessment & Plan Note (Addendum)
Pt continues to suffer from chronic pain (also anxiety)  She wants to consider cymbalta in the future  Advised not to begin until after she is done breast feeding- she plans to wean relatively soon  Will update when ready to start it   Recent lab profile for his and fatigue also done by chiropractor is en route for me to review

## 2015-09-23 NOTE — Telephone Encounter (Signed)
Patient notified as instructed by telephone and verbalized understanding. 

## 2015-09-23 NOTE — Assessment & Plan Note (Addendum)
Pt reports her xray from a chiropractor visit noted an enlarged aortic silhouette (she does not have a report) Reviewed her recent CT of abd and pelvis-with nl appearing aorta from June- very re assuring  Also no note of atherosclerosis  No symptoms  Will continue to follow - printed copy of Ct for her to take to chiropractor and req a copy of the original xray report to [place]

## 2015-09-23 NOTE — Assessment & Plan Note (Signed)
Pt has hx of low D- she was originally px high dose tx for 12 weeks-but never finished it  Pending level from recent draw at chiropractor office  Px ergocalciferol 50,000 iu weekly for 12 weeks  Continue to follow  Disc need for daily dosing after this as well as balanced diet  Pt does take PPI Disc imp of D to bone and overall health

## 2015-09-23 NOTE — Telephone Encounter (Signed)
I sent it to her pharmacy.  Tell her not to use oral nsaids with it

## 2015-09-23 NOTE — Assessment & Plan Note (Signed)
Pt is due to travel to [place] soon- given flu shot as well as hep A/B vaccines as well as flu shot   She will disc/recieve other necessary immunizations at the health dept

## 2015-09-29 ENCOUNTER — Other Ambulatory Visit: Payer: Self-pay | Admitting: Oncology

## 2015-09-29 ENCOUNTER — Ambulatory Visit (INDEPENDENT_AMBULATORY_CARE_PROVIDER_SITE_OTHER): Payer: 59 | Admitting: Oncology

## 2015-09-29 ENCOUNTER — Encounter: Payer: Self-pay | Admitting: Oncology

## 2015-09-29 VITALS — BP 123/77 | HR 107 | Temp 98.2°F | Ht 64.0 in | Wt 180.0 lb

## 2015-09-29 DIAGNOSIS — D509 Iron deficiency anemia, unspecified: Secondary | ICD-10-CM

## 2015-09-29 NOTE — Patient Instructions (Signed)
Iron intravenous to be scheduled for Friday 10/01/15 at [facility] short stay unit, second floor. Call when you get back from vacation to schedule a second dose We do not need to check another blood count for 8 weeks MD visit 1-2 days after lab done

## 2015-09-29 NOTE — Progress Notes (Signed)
New Patient Hematology   Sarah Castillo [id number] April 16, 1985 30 y.o. 09/29/2015  CC: Dr. Loura Pardon   Reason for referral: Iron deficiency anemia rule out malabsorption   HPI:  Pleasant 30 year old woman originally from [place] who is a chronic GI complaints dating back at least 7 years. She likely has irritable bowel syndrome. She has problems with reflux, alternating constipation and diarrhea. Chronic diffuse abdominal pain. She had both EGD and colonoscopy with biopsies of the colon in 2010 and the studies were reported to her as being normal. No signs of inflammatory bowel disorder. She does occasionally see some scant amount of blood in the stool. She has been on Protonix for a number of years. She also takes ibuprofen 600 mg 3 times daily for her abdominal and back pain. She had a discectomy in 2009 and a spinal fusion procedure in 2014. Her menstrual cycles are regular. A CT scan of the abdomen and pelvis done for increasing chronic abdominal pain on 06/17/2015 showed no abnormalities of the liver, or gallbladder, and spleen with normal size. She has been chronically anemic with ferritin levels recorded as low as 6. She has now been on ferrous gluconate for 3 months with partial improvement of her hemoglobin increasing from 8.1 with MCV of 78 on 12/17/2014 to most recent value of 10.5 with MCV 65 onset timber 11 2017. Ferritin 10. Serum iron 37. TIBC 424. Percent saturation 9%. A recent vitamin D level borderline decreased and she was started on a supplement. [id number] and folic acid both normal, 811 and greater than 20 respectively done September 11. She denies any hematuria and urinalysis shows no blood. She has normal renal and liver function. Bilirubin 0.4. Thyroid functions normal. TSH 3.16.  She has multiple somatic complaints. Overall weakness, fatigue, lightheadedness, dizziness,  almost daily headache, intermittent blurry vision, and pressure over her sinuses, chronic stomach  pain, chronic back pain, chronic joint pain in large and small joints, recent weight gain. No hair loss. No skin sensitivity or rashes. No easy bruising or bleeding. She gets intermittent paresthesias of her hands and feet.  She has 4 brothers. One brother age 57 also has stomach problems and hemorrhoids. Her mother has hypertension and thyroid disease but no GI problems. Her father died at age 32 of a heart attack. There is no family history of any anemia.    PMH: Past Medical History:  Diagnosis Date  . Acute blood loss anemia 01/01/2015  . Allergy   . Anxiety   . Depression   . Diabetes mellitus without complication ([facility])   . Gestational diabetes mellitus (GDM), antepartum   . IBS (irritable bowel syndrome)   . PCOS (polycystic ovarian syndrome)   Hepatitis type unknown Age 52 when living in [place]. No history of mononucleosis. No history of malaria.  Past Surgical History:  Procedure Laterality Date  . CESAREAN SECTION N/A 12/16/2014   Procedure: CESAREAN SECTION;  Surgeon: Crawford Givens, MD;  Location: [facility];  Service: Obstetrics;  Laterality: N/A;  . COLONOSCOPY W/ BIOPSIES  01/19/09   normal  . ESOPHAGOGASTRODUODENOSCOPY  08/03/08   mild gastritis and HH  . LASIK  2012  . LUMBAR DISC SURGERY  09/2007   L5S1  . LUMBAR FUSION  12/2012   L5S1    Allergies: Allergies  Allergen Reactions  . Metformin And Related Diarrhea    GI side eff   . Zoloft [Sertraline] Diarrhea  . Hydromorphone Rash, Pruritus     Other reaction(s): Other (See Comments) Other  Reaction: FEVER  . Morphine Rash and Other (See Comments)    Other Reaction: FEVER    Medications:  Current Outpatient Prescriptions:  .  ARTIFICIAL TEAR OP, Apply 1 drop to eye as needed (dry eyes)., Disp: , Rfl:  .  Cholecalciferol (VITAMIN D3 PO), Take 4,000 Units by mouth daily., Disp: , Rfl:  .  diclofenac sodium (VOLTAREN) 1 % GEL, Apply 2 g topically 4 (four) times daily. As needed for pain to affected areas,  Disp: 100 g, Rfl: 1 .  ergocalciferol (VITAMIN D2) 50000 units capsule, Take 1 capsule (50,000 Units total) by mouth once a week., Disp: 12 capsule, Rfl: 0 .  ferrous sulfate (IRON SUPPLEMENT) 325 (65 FE) MG tablet, Take 1 tablet (325 mg total) by mouth 2 (two) times daily with a meal. For 14 days, then once daily for 28 days., Disp: 45 tablet, Rfl: 2 .  fluticasone (FLONASE) 50 MCG/ACT nasal spray, Place into both nostrils daily., Disp: , Rfl:  .  ibuprofen (ADVIL,MOTRIN) 600 MG tablet, Take 1 tablet (600 mg total) by mouth every 6 (six) hours as needed for mild pain., Disp: 30 tablet, Rfl: 2 .  iron polysaccharides (NU-IRON) 150 MG capsule, Take 1 capsule (150 mg total) by mouth daily., Disp: 30 capsule, Rfl: 3 .  loratadine (CLARITIN) 10 MG tablet, Take 10 mg by mouth at bedtime. , Disp: , Rfl:  .  omeprazole (PRILOSEC) 20 MG capsule, TAKE 1 CAPSULE BY MOUTH 2 TIMES DAILY BEFORE A MEAL., Disp: 180 capsule, Rfl: 1 .  Prenatal Vit-Fe Fumarate-FA (PRENATAL MULTIVITAMIN) TABS tablet, Take 1 tablet by mouth daily at 12 noon., Disp: , Rfl:   Social History: Married to one of our Medicine Residents. She just had her first child and required C-section 8 months ago.  reports that she has never smoked. She has never used smokeless tobacco. She reports that she does not drink alcohol or use drugs.  Family History: Family History  Problem Relation Age of Onset  . Heart attack Father   . Hypertension Father   . Hyperlipidemia Father   . Colon cancer Cousin   . Diabetes Paternal Grandmother   . Diabetes Paternal Grandfather   . Diabetes Maternal Grandmother   . Diabetes Maternal Grandfather   . Hypertension Mother   . Thyroid disease Mother   . Hyperlipidemia Mother     Review of Systems: See history of present illness Remaining ROS negative.  Physical Exam: Blood pressure 123/77, pulse (!) 107, temperature 98.2 F (36.8 C), temperature source Oral, height 5\' 4"  (1.626 m), weight 180 lb (81.6  kg), last menstrual period 08/22/2015, SpO2 100 %, not currently breastfeeding. Wt Readings from Last 3 Encounters:  09/29/15 180 lb (81.6 kg)  09/22/15 179 lb 4 oz (81.3 kg)  06/15/15 181 lb (82.1 kg)     General appearance: Healthy-appearing young woman HENNT: Pharynx no erythema, exudate, mass, or ulcer. No thyromegaly or thyroid nodules Lymph nodes: No cervical, supraclavicular, or axillary lymphadenopathy Breasts: Lungs: Clear to auscultation, resonant to percussion throughout Heart: Regular rhythm, no murmur, no gallop, no rub, no click, no edema Abdomen: Soft, tender in the epigastric, left upper quadrant, and left lower quadrant, normal bowel sounds, no mass, no organomegaly Extremities: No edema, no calf tenderness Musculoskeletal: no joint deformities GU:  Vascular:  Neurologic: Alert, oriented, PERRLA, optic discs sharp and vessels normal, no hemorrhage or exudate, cranial nerves grossly normal, motor strength 5 over 5, reflexes 1+ symmetric, upper body coordination normal, gait normal, vibration  sensation intact over the fingertips by tuning fork exam Skin: No rash or ecchymosis    Lab Results: Lab Results  Component Value Date   WBC 8.0 06/15/2015   HGB 10.4 (L) 06/15/2015   HCT 32.5 (L) 06/15/2015   MCV 66.1 Repeated and verified X2. (L) 06/15/2015   PLT 320.0 06/15/2015     Chemistry      Component Value Date/Time   NA 137 06/15/2015 1521   NA 138 12/22/2013 0449   K 4.0 06/15/2015 1521   K 3.5 12/22/2013 0449   CL 105 06/15/2015 1521   CL 105 12/22/2013 0449   CO2 25 06/15/2015 1521   CO2 23 12/22/2013 0449   BUN 10 06/15/2015 1521   BUN 9 12/22/2013 0449   CREATININE 0.72 06/15/2015 1521   CREATININE 0.85 12/22/2013 0449   CREATININE 0.66 08/05/2013 1615      Component Value Date/Time   CALCIUM 9.3 06/15/2015 1521   CALCIUM 8.7 12/22/2013 0449   ALKPHOS 66 06/15/2015 1521   AST 19 06/15/2015 1521   ALT 28 06/15/2015 1521   BILITOT 0.4 06/15/2015  1521     Recent CBC done through her chiropractor's office on 09/20/2015: Hemoglobin 10.5, hematocrit 33, MCV 65, white count 5200 with 52 neutrophils, 35 lymphocytes, 8 monocytes, 4 eosinophils, 1 basophil, platelet count 290,000. Iron studies as recorded above.     Radiological Studies: No results found.    Impression: Partial iron malabsorption We talked about a number of potential contributing factors including usual menstrual losses, chronic gastritis and I'm concerned with the fact that she takes 600 mg of ibuprofen 3 times a day, there may also be some inhibition of oral iron by her proton pump inhibitor. She may be from from an endemic area where there is Helicobacter infection which can cause iron malabsorption. Adult celiac disease also in the differential and would require a duodenal biopsy for confirmation. Recommendation: She is now on an  adequate 3 month trial of oral iron with only partial response. I'm going to schedule her for a parenteral iron infusion with Feraheme 510 mg initially. She'll be going on vacation with her husband. When she returns I will schedule a second dose. It takes about 6-8 weeks to see the maximum effect and I will check a blood count and ferritin in late November. She is advised to stop ibuprofen and use Tylenol for her pain. We should be able to bypass any effect that the protonix is having on iron absorption by giving her the parenteral iron. We discussed possibility of checking blood or stool for Helicobacter and also a follow-up upper endoscopy to do small bowel biopsies to rule out adult celiac disease/gluten enteropathy if her hemoglobin should drift back down again subsequent to the iron infusions.       Annia Belt, MD 09/29/2015, 5:14 PM

## 2015-09-30 ENCOUNTER — Ambulatory Visit (HOSPITAL_COMMUNITY)
Admission: RE | Admit: 2015-09-30 | Discharge: 2015-09-30 | Disposition: A | Discharge status: Home / Self Care | Payer: 59 | Source: Ambulatory Visit | Attending: Oncology | Admitting: Oncology

## 2015-09-30 ENCOUNTER — Telehealth: Payer: Self-pay | Admitting: *Deleted

## 2015-09-30 DIAGNOSIS — D509 Iron deficiency anemia, unspecified: Secondary | ICD-10-CM | POA: Diagnosis not present

## 2015-09-30 MED ORDER — SODIUM CHLORIDE 0.9 % IV SOLN
510.0000 mg | Freq: Once | INTRAVENOUS | Status: AC
  Administered 2015-09-30: 510 mg via INTRAVENOUS
  Filled 2015-09-30: qty 17

## 2015-09-30 MED ORDER — SODIUM CHLORIDE 0.9 % IV SOLN
510.0000 mg | Freq: Once | INTRAVENOUS | Status: DC
  Filled 2015-09-30: qty 17

## 2015-09-30 NOTE — Telephone Encounter (Signed)
[facility] Short Stay has 12 noon appt for today; no appts for tomorrow - call home # left message whenno one called back , I called Dr Genene Churn @ [phone]. Stated will  have wife here at [facility] for this appt - instructed to register her at the Admissions office 15 mins prior to appt.

## 2015-09-30 NOTE — Telephone Encounter (Signed)
-----   Message from Annia Belt, MD sent at 09/29/2015  5:46 PM EDT ----- Regarding: schedule IV iron Please call short stay to schedule 1 dose of ferraheme for Friday 9/22

## 2015-09-30 NOTE — Discharge Instructions (Signed)

## 2015-11-10 ENCOUNTER — Encounter: Payer: Self-pay | Admitting: Family Medicine

## 2015-11-10 ENCOUNTER — Ambulatory Visit (INDEPENDENT_AMBULATORY_CARE_PROVIDER_SITE_OTHER): Payer: 59 | Admitting: Family Medicine

## 2015-11-10 VITALS — BP 102/68 | HR 87 | Temp 98.8°F | Ht 64.0 in | Wt 182.0 lb

## 2015-11-10 DIAGNOSIS — G4452 New daily persistent headache (NDPH): Secondary | ICD-10-CM | POA: Diagnosis not present

## 2015-11-10 DIAGNOSIS — H1013 Acute atopic conjunctivitis, bilateral: Secondary | ICD-10-CM

## 2015-11-10 DIAGNOSIS — J3089 Other allergic rhinitis: Secondary | ICD-10-CM | POA: Diagnosis not present

## 2015-11-10 DIAGNOSIS — R519 Headache, unspecified: Secondary | ICD-10-CM | POA: Insufficient documentation

## 2015-11-10 DIAGNOSIS — H1010 Acute atopic conjunctivitis, unspecified eye: Secondary | ICD-10-CM | POA: Insufficient documentation

## 2015-11-10 DIAGNOSIS — R51 Headache: Secondary | ICD-10-CM

## 2015-11-10 MED ORDER — BACLOFEN 10 MG PO TABS: 5.0000 mg | ORAL_TABLET | Freq: Three times a day (TID) | ORAL | 3 refills | Status: DC

## 2015-11-10 MED ORDER — OLOPATADINE HCL 0.2 % OP SOLN: 1.0000 [drp] | Freq: Every day | OPHTHALMIC | 11 refills | Status: DC

## 2015-11-10 MED ORDER — FLUTICASONE PROPIONATE 50 MCG/ACT NA SUSP: 2.0000 | Freq: Every day | NASAL | 3 refills | Status: AC

## 2015-11-10 NOTE — Progress Notes (Signed)
Subjective:    Patient ID: Sarah Castillo, female    DOB: 10-26-1985, 30 y.o.   MRN: [id number]  HPI Here for headaches  Eyes are sore and puffy  occ sharp pains and sensitive to light  Really bothers her  Tries artificial eyes  Dark circles and eyes look red   A little nasal congestion  Sinuses are acting up  Some pain under eyes  Coughed a little mucous today- was brown  Nasal congestion is clear  Ears bother her   TMJ still bothers her She does wear a nite guard -it helps some    Feels like her vision is a bit blurry- like a glare when she is concentrating  occ sees floaters  Last eye exam -at least a year ago  No contacts  Has glasses -does not wear them (they give her a headache feels like they are too strong)  lasik in 2012   Pressure on head - L side - top of the head and radiates down behind her ear and then around both sides into jaw  Now it is continuous  Neck hurts also - tries to stretch it out   Going on about 90 days   Uses tylenol  occ ibuprofen - hurts her stomach excedrin migraine works the best   She drinks tea (not strong) occ strong coffee helps a headache    Wt Readings from Last 3 Encounters:  11/10/15 182 lb (82.6 kg)  09/29/15 180 lb (81.6 kg)  09/22/15 179 lb 4 oz (81.3 kg)   bmi is 31.2   Drinks lots of water No longer breast feeding  Her myofascial pain is the same   Is not on birth control right now  She does not plan on any new pregnancies yet   Patient Active Problem List   Diagnosis Date Noted  . Headache 11/10/2015  . Allergic conjunctivitis 11/10/2015  . Advice or immunization for travel 09/23/2015  . Abnormal x-ray 09/23/2015  . Vitamin D deficiency 09/23/2015  . Iron deficiency anemia 09/23/2015  . Lower abdominal pain 06/15/2015  . Degeneration of lumbar or lumbosacral intervertebral disc 05/04/2015  . Mouth ulcer 04/30/2015  . Temporomandibular joint (TMJ) pain 01/01/2015  . Postoperative anemia due  to acute blood loss 01/01/2015  . Tingling in extremities 01/01/2015  . S/P C-section 12/16/2014  . Fatigue 03/25/2014  . Rash and nonspecific skin eruption 03/25/2014  . Nausea without vomiting 02/17/2014  . Breast pain 02/17/2014  . Myofascial pain 12/24/2013  . Tachycardia 12/24/2013  . PCOS (polycystic ovarian syndrome) 11/25/2013  . Adjustment disorder with mixed anxiety and depressed mood 11/25/2013  . Allergic rhinitis 11/25/2013  . GERD (gastroesophageal reflux disease) 11/25/2013  . IBS (irritable bowel syndrome) 11/25/2013  . Left knee pain 11/06/2013  . S/P lumbar fusion 11/06/2013   Past Medical History:  Diagnosis Date  . Acute blood loss anemia 01/01/2015  . Allergy   . Anxiety   . Depression   . Diabetes mellitus without complication ([place])   . Gestational diabetes mellitus (GDM), antepartum   . IBS (irritable bowel syndrome)   . PCOS (polycystic ovarian syndrome)    Past Surgical History:  Procedure Laterality Date  . CESAREAN SECTION N/A 12/16/2014   Procedure: CESAREAN SECTION;  Surgeon: Crawford Givens, MD;  Location: [facility];  Service: Obstetrics;  Laterality: N/A;  . COLONOSCOPY W/ BIOPSIES  01/19/09   normal  . ESOPHAGOGASTRODUODENOSCOPY  08/03/08   mild gastritis and HH  . LASIK  2012  . LUMBAR DISC SURGERY  09/2007   L5S1  . LUMBAR FUSION  12/2012   L5S1   Social History  Substance Use Topics  . Smoking status: Never Smoker  . Smokeless tobacco: Never Used  . Alcohol use No   Family History  Problem Relation Age of Onset  . Heart attack Father   . Hypertension Father   . Hyperlipidemia Father   . Colon cancer Cousin   . Diabetes Paternal Grandmother   . Diabetes Paternal Grandfather   . Diabetes Maternal Grandmother   . Diabetes Maternal Grandfather   . Hypertension Mother   . Thyroid disease Mother   . Hyperlipidemia Mother    Allergies  Allergen Reactions  . Flexeril [Cyclobenzaprine]     Palpitations (she thinks)  . Metformin And  Related Diarrhea    GI side eff   . Zoloft [Sertraline] Diarrhea  . Hydromorphone Rash    Other reaction(s): Other (See Comments) Other Reaction: FEVER  . Morphine Rash and Other (See Comments)    Other Reaction: FEVER   Current Outpatient Prescriptions on File Prior to Visit  Medication Sig Dispense Refill  . ARTIFICIAL TEAR OP Apply 1 drop to eye as needed (dry eyes).    . Cholecalciferol (VITAMIN D3 PO) Take 4,000 Units by mouth daily.    . diclofenac sodium (VOLTAREN) 1 % GEL Apply 2 g topically 4 (four) times daily. As needed for pain to affected areas 100 g 1  . ergocalciferol (VITAMIN D2) 50000 units capsule Take 1 capsule (50,000 Units total) by mouth once a week. 12 capsule 0  . ibuprofen (ADVIL,MOTRIN) 600 MG tablet Take 1 tablet (600 mg total) by mouth every 6 (six) hours as needed for mild pain. 30 tablet 2  . loratadine (CLARITIN) 10 MG tablet Take 10 mg by mouth at bedtime.     Marland Kitchen omeprazole (PRILOSEC) 20 MG capsule TAKE 1 CAPSULE BY MOUTH 2 TIMES DAILY BEFORE A MEAL. 180 capsule 1  . Prenatal Vit-Fe Fumarate-FA (PRENATAL MULTIVITAMIN) TABS tablet Take 1 tablet by mouth daily at 12 noon.     No current facility-administered medications on file prior to visit.     Review of Systems Review of Systems  Constitutional: Negative for fever, appetite change, and unexpected weight change.  ENT pos for cong and rhinorrhea  Eyes: Negative for pain and pos for eye itch/irritation and eye strain  Respiratory: Negative for cough and shortness of breath.   Cardiovascular: Negative for cp or palpitations    Gastrointestinal: Negative for nausea, diarrhea and constipation.  Genitourinary: Negative for urgency and frequency.  Skin: Negative for pallor or rash   MSK pos for myofascial pain and TMJ pain  Neurological: Negative for weakness, light-headedness, numbness and pos for  headaches.  Hematological: Negative for adenopathy. Does not bruise/bleed easily.  Psychiatric/Behavioral:  Negative for dysphoric mood. The patient is not nervous/anxious.         Objective:   Physical Exam  Constitutional: She is oriented to person, place, and time. She appears well-developed and well-nourished. No distress.  obese and well appearing   HENT:  Head: Normocephalic and atraumatic.  Right Ear: External ear normal.  Left Ear: External ear normal.  Nose: Nose normal.  Mouth/Throat: Oropharynx is clear and moist. No oropharyngeal exudate.  No sinus tenderness No temporal tenderness   L TMJ tenderness  Nares are boggy and congested with clear rhinorrhea and pnd   Eyes: EOM are normal. Pupils are equal, round, and reactive  to light. Right eye exhibits no discharge. Left eye exhibits no discharge. No scleral icterus.  No nystagmus  Mild conjunctival injection bilat without drainage   Neck: Normal range of motion and full passive range of motion without pain. Neck supple. No JVD present. Carotid bruit is not present. No tracheal deviation present. No thyromegaly present.  Cardiovascular: Normal rate, regular rhythm and normal heart sounds.   No murmur heard. Pulmonary/Chest: Effort normal and breath sounds normal. No respiratory distress. She has no wheezes. She has no rales.  Abdominal: Soft. Bowel sounds are normal. She exhibits no distension and no mass. There is no tenderness.  Musculoskeletal: She exhibits no edema or tenderness.  Lymphadenopathy:    She has no cervical adenopathy.  Neurological: She is alert and oriented to person, place, and time. She has normal strength and normal reflexes. She displays no atrophy and no tremor. No cranial nerve deficit or sensory deficit. She exhibits normal muscle tone. She displays a negative Romberg sign. Coordination and gait normal.  No focal cerebellar signs   Skin: Skin is warm and dry. No rash noted. No pallor.  Psychiatric: Her behavior is normal. Thought content normal. Her mood appears anxious.  Mildly anxious             Assessment & Plan:   Problem List Items Addressed This Visit      Respiratory   Allergic rhinitis - Primary    This may be perpetuating her headaches  No s/s of bacterial infection  Change antihistamine to zyrtec 10 mg flonase daily for congestion  Avoidance of allergies tx conjunctivitis with pataday        Other   Allergic conjunctivitis    Trial of pataday eye drops daily  Avoid allergens F/u with opthy      Relevant Orders   Ambulatory referral to Ophthalmology   Headache    Suspect multifactorial - TMJ, allergic rhinitis and conjunctivitis, busy lifestyle and poss hormonal change, also neck tension  Will try baclofen for rescue- avoiding acetaminophen in light of poss rebound headache  Zyrtec and flonase for allergies pataday for all conjunctivitis F/u with opthy for eye strain  Continue nite guard      Relevant Medications   baclofen (LIORESAL) 10 MG tablet    Other Visit Diagnoses   None.

## 2015-11-10 NOTE — Patient Instructions (Addendum)
Stop at check out for eye doctor referral  Start using the pataday drops daily  flonase daily  Change your antihistamine to zyrtec 5-10 mg at bedtime  Try to avoid allergens  Try the baclofen (watch out for sedation) for headache and body pain as needed

## 2015-11-10 NOTE — Progress Notes (Signed)
Pre visit review using our clinic review tool, if applicable. No additional management support is needed unless otherwise documented below in the visit note. 

## 2015-11-11 DIAGNOSIS — R51 Headache: Secondary | ICD-10-CM | POA: Diagnosis not present

## 2015-11-11 DIAGNOSIS — H10413 Chronic giant papillary conjunctivitis, bilateral: Secondary | ICD-10-CM | POA: Diagnosis not present

## 2015-11-11 NOTE — Assessment & Plan Note (Signed)
This may be perpetuating her headaches  No s/s of bacterial infection  Change antihistamine to zyrtec 10 mg flonase daily for congestion  Avoidance of allergies tx conjunctivitis with pataday

## 2015-11-11 NOTE — Assessment & Plan Note (Signed)
Suspect multifactorial - TMJ, allergic rhinitis and conjunctivitis, busy lifestyle and poss hormonal change, also neck tension  Will try baclofen for rescue- avoiding acetaminophen in light of poss rebound headache  Zyrtec and flonase for allergies pataday for all conjunctivitis F/u with opthy for eye strain  Continue nite guard

## 2015-11-11 NOTE — Assessment & Plan Note (Signed)
Trial of pataday eye drops daily  Avoid allergens F/u with opthy

## 2016-01-11 ENCOUNTER — Other Ambulatory Visit: Payer: Self-pay | Admitting: Oncology

## 2016-01-11 DIAGNOSIS — D509 Iron deficiency anemia, unspecified: Secondary | ICD-10-CM

## 2016-01-13 ENCOUNTER — Ambulatory Visit (HOSPITAL_COMMUNITY): Admission: RE | Admit: 2016-01-13 | Payer: 59 | Source: Ambulatory Visit

## 2016-01-20 ENCOUNTER — Ambulatory Visit (HOSPITAL_COMMUNITY)
Admission: RE | Admit: 2016-01-20 | Discharge: 2016-01-20 | Disposition: A | Discharge status: Home / Self Care | Payer: 59 | Source: Ambulatory Visit | Attending: Oncology | Admitting: Oncology

## 2016-01-20 ENCOUNTER — Telehealth: Payer: Self-pay | Admitting: Family Medicine

## 2016-01-20 ENCOUNTER — Telehealth: Payer: Self-pay | Admitting: Neurology

## 2016-01-20 DIAGNOSIS — D509 Iron deficiency anemia, unspecified: Secondary | ICD-10-CM | POA: Diagnosis not present

## 2016-01-20 DIAGNOSIS — G4452 New daily persistent headache (NDPH): Secondary | ICD-10-CM

## 2016-01-20 MED ORDER — SODIUM CHLORIDE 0.9 % IV SOLN
510.0000 mg | Freq: Once | INTRAVENOUS | Status: AC
  Administered 2016-01-20: 510 mg via INTRAVENOUS
  Filled 2016-01-20: qty 17

## 2016-01-20 NOTE — Telephone Encounter (Signed)
Will route to [facility] Husband said that [facility] can see her within the next week so I did a referral  Thanks

## 2016-01-20 NOTE — Telephone Encounter (Signed)
Called [facility], they had already called her to schedule her. They will call her back today.

## 2016-01-20 NOTE — Telephone Encounter (Signed)
Spoke with Diane in referrals. Advised per AA,MD we can fit patient in today at 200pm, check in 130pm or Monday at 430pm, check in 400pm. She will contact patient and offer appt and see if PCP can send notes.

## 2016-01-20 NOTE — Telephone Encounter (Signed)
Patient is requesting an urgent appointment. I'm happy to squeeze her in urgently. Please call patient and see if she can call her pcp to get a referral. If she does not have a pcp we can work something out for her. Let me know. Thanks   [name], MD  Melvenia Beam, MD        Dr. Jaynee Eagles,  This is [name], one of the internal medicine residents. I worked with you for a day on November in the clinic.   I am writing to request an urgent appt for my wife Sarah Castillo with you. She has chronic headache but recently worsened, has significant suffering from it and it's impacting her sleep and daily function. I was wondering if you would please see her?   her number is [phone].   Thanks a lot,  [facility]  [phone]

## 2016-01-24 ENCOUNTER — Ambulatory Visit (INDEPENDENT_AMBULATORY_CARE_PROVIDER_SITE_OTHER): Payer: 59 | Admitting: Neurology

## 2016-01-24 ENCOUNTER — Encounter: Payer: Self-pay | Admitting: Neurology

## 2016-01-24 VITALS — BP 124/80 | HR 85 | Ht 65.0 in | Wt 182.0 lb

## 2016-01-24 DIAGNOSIS — R51 Headache with orthostatic component, not elsewhere classified: Secondary | ICD-10-CM

## 2016-01-24 DIAGNOSIS — H5710 Ocular pain, unspecified eye: Secondary | ICD-10-CM | POA: Diagnosis not present

## 2016-01-24 DIAGNOSIS — R29898 Other symptoms and signs involving the musculoskeletal system: Secondary | ICD-10-CM

## 2016-01-24 DIAGNOSIS — G43709 Chronic migraine without aura, not intractable, without status migrainosus: Secondary | ICD-10-CM | POA: Diagnosis not present

## 2016-01-24 DIAGNOSIS — R519 Headache, unspecified: Secondary | ICD-10-CM

## 2016-01-24 DIAGNOSIS — H539 Unspecified visual disturbance: Secondary | ICD-10-CM

## 2016-01-24 MED ORDER — NORTRIPTYLINE HCL 10 MG PO CAPS: 20.0000 mg | ORAL_CAPSULE | Freq: Every day | ORAL | 6 refills | Status: AC

## 2016-01-24 MED ORDER — SUMATRIPTAN SUCCINATE 50 MG PO TABS: 50.0000 mg | ORAL_TABLET | Freq: Once | ORAL | 6 refills | Status: DC | PRN

## 2016-01-24 NOTE — Progress Notes (Signed)
[facility]    Provider:  Dr Jaynee Eagles Referring Provider: Abner Greenspan, MD Primary Care Physician:  Loura Pardon, MD  CC:  Chronic migraine  HPI:  Sarah Castillo is a 31 y.o. female here as a referral from Dr. Glori Bickers for migraine. PMHx of headaches. She has eye pressure in the back of the head. Her arms get numb and tingly when she lays down mostly the right. She has to move her arm around and it wakes her up from sleep. Ever since she remembers she has had headaches worsen in the last 3 months. She has pain and soreness all over the body. She described the headaches as pressure all over the head and stabbing pain on the right side of the head. They start on the left and spread to the eye and bad of the head and jaw. She has pressure and stabbing. Painful eyes and her eyes can get really puffy and she has a lot of pressure pain in the temples and stabbing. The light bothers her, she doesn't want to talk, she wants to be in a dark quiet room sitting still. She is having the headaches every day. The headaches occur later in the day around 530-6pm. No aura. She has had left eye blurry vision and fuzziness last month, she wasn;t seeing clearly like she has a film on top of her eyes and she saw the ophthalmologist, her eyes were red and eye pain on movement. She says she may have Fibromyalgia. She takes ibuprofen and tylenol. Occ she wakes with headaches. She doesn't get sound sleep. Difficult for her to sleep and she wakes up with headache and sore body every day and she is irritated due to the pain and discomfort.  She is in bed 9 hours. The headaches can be severe. She has nausea. Mother with headaches. She also has right arm weakness and numbness. No other focal neurologic deficits, associated symptoms, inciting events or modifiable factors.  Reviewed notes, labs and imaging from outside physicians, which showed:  CT head 12/2013 (personally reviewed images and agree with the  following):  Skull and Sinuses:Negative for fracture or destructive process. The  mastoids, middle ears, and imaged paranasal sinuses are clear.   Orbits: No acute abnormality.   Brain: No evidence of acute infarction, hemorrhage, hydrocephalus,  or mass lesion/mass effect.   IMPRESSION:  Negative head CT.   CBC 09/2015 and CMP 06/2015 nml (BUN 10, creatinine 0.72)  Review of Systems: Patient complains of symptoms per HPI as well as the following symptoms: fatigue, eye pain. Pertinent negatives per HPI. All others negative.   Social History   Social History  . Marital status: Married    Spouse name: N/A  . Number of children: 1  . Years of education: Bachelor   Occupational History  . mother    Social History Main Topics  . Smoking status: Never Smoker  . Smokeless tobacco: Never Used  . Alcohol use No  . Drug use: No  . Sexual activity: Yes    Partners: Male    Birth control/ protection: None   Other Topics Concern  . Not on file   Social History Narrative   From [place] originally       Moved here from [place] in 2015    Husband is a medical resident at [facility] for IM       She stays home -considering school and starting a family       Caffeine use: Drinks tea  2x/day   Coffee prn per patient       Family History  Problem Relation Age of Onset  . Heart attack Father   . Hypertension Father   . Hyperlipidemia Father   . Colon cancer Cousin   . Diabetes Paternal Grandmother   . Diabetes Paternal Grandfather   . Diabetes Maternal Grandmother   . Diabetes Maternal Grandfather   . Hypertension Mother   . Thyroid disease Mother   . Hyperlipidemia Mother     Past Medical History:  Diagnosis Date  . Acute blood loss anemia 01/01/2015  . Allergy   . Anxiety   . Depression   . Diabetes mellitus without complication ([place])   . Gestational diabetes mellitus (GDM), antepartum   . IBS (irritable bowel syndrome)   . PCOS (polycystic ovarian syndrome)      Past Surgical History:  Procedure Laterality Date  . CESAREAN SECTION N/A 12/16/2014   Procedure: CESAREAN SECTION;  Surgeon: Crawford Givens, MD;  Location: [facility];  Service: Obstetrics;  Laterality: N/A;  . COLONOSCOPY W/ BIOPSIES  01/19/09   normal  . ESOPHAGOGASTRODUODENOSCOPY  08/03/08   mild gastritis and HH  . LASIK  2012  . LUMBAR DISC SURGERY  09/2007   L5S1  . LUMBAR FUSION  12/2012   L5S1    Current Outpatient Prescriptions  Medication Sig Dispense Refill  . ARTIFICIAL TEAR OP Apply 1 drop to eye as needed (dry eyes).    Marland Kitchen diclofenac sodium (VOLTAREN) 1 % GEL Apply 2 g topically 4 (four) times daily. As needed for pain to affected areas 100 g 1  . ergocalciferol (VITAMIN D2) 50000 units capsule Take 1 capsule (50,000 Units total) by mouth once a week. 12 capsule 0  . fluticasone (FLONASE) 50 MCG/ACT nasal spray Place 2 sprays into both nostrils daily. 48 g 3  . ibuprofen (ADVIL,MOTRIN) 600 MG tablet Take 1 tablet (600 mg total) by mouth every 6 (six) hours as needed for mild pain. 30 tablet 2  . loratadine (CLARITIN) 10 MG tablet Take 10 mg by mouth at bedtime.     . Olopatadine HCl 0.2 % SOLN Apply 1 drop to eye daily. 1 Bottle 11  . omeprazole (PRILOSEC) 20 MG capsule TAKE 1 CAPSULE BY MOUTH 2 TIMES DAILY BEFORE A MEAL. 180 capsule 1  . Prenatal Vit-Fe Fumarate-FA (PRENATAL MULTIVITAMIN) TABS tablet Take 1 tablet by mouth daily at 12 noon.    . baclofen (LIORESAL) 10 MG tablet Take 0.5-1 tablets (5-10 mg total) by mouth 3 (three) times daily. As needed for headache (Patient not taking: Reported on 01/24/2016) 90 each 3  . nortriptyline (PAMELOR) 10 MG capsule Take 2 capsules (20 mg total) by mouth at bedtime. 60 capsule 6  . SUMAtriptan (IMITREX) 50 MG tablet Take 1 tablet (50 mg total) by mouth once as needed for migraine. May repeat in 2 hours. No more than twice in one day. 10 tablet 6   No current facility-administered medications for this visit.     Allergies as of  01/24/2016 - Review Complete 01/20/2016  Allergen Reaction Noted  . Flexeril [cyclobenzaprine]  11/10/2015  . Metformin and related Diarrhea 11/25/2013  . Zoloft [sertraline] Diarrhea 01/06/2014  . Hydromorphone Rash 05/04/2015  . Morphine Rash and Other (See Comments) 08/05/2013    Vitals: BP 124/80 (BP Location: Right Arm, Patient Position: Sitting, Cuff Size: Normal)   Pulse 85   Ht 5\' 5"  (1.651 m)   Wt 182 lb (82.6 kg)   BMI  30.29 kg/m  Last Weight:  Wt Readings from Last 1 Encounters:  01/24/16 182 lb (82.6 kg)   Last Height:   Ht Readings from Last 1 Encounters:  01/24/16 5\' 5"  (1.651 m)   Physical exam: Exam: Gen: NAD, conversant, well nourised, obese, well groomed                     CV: RRR, no MRG. No Carotid Bruits. No peripheral edema, warm, nontender Eyes: Conjunctivae clear without exudates or hemorrhage  Neuro: Detailed Neurologic Exam  Speech:    Speech is normal; fluent and spontaneous with normal comprehension.  Cognition:    The patient is oriented to person, place, and time;     recent and remote memory intact;     language fluent;     normal attention, concentration,     fund of knowledge Cranial Nerves:    The pupils are equal, round, and reactive to light. The fundi are normal and spontaneous venous pulsations are present. Visual fields are full to finger confrontation. Extraocular movements are intact. Trigeminal sensation is intact and the muscles of mastication are normal. The face is symmetric. The palate elevates in the midline. Hearing intact. Voice is normal. Shoulder shrug is normal. The tongue has normal motion without fasciculations.   Coordination:    Normal finger to nose and heel to shin. Normal rapid alternating movements.   Gait:    Heel-toe and tandem gait are normal.   Motor Observation:    No asymmetry, no atrophy, and no involuntary movements noted. Tone:    Normal muscle tone.    Posture:    Posture is normal. normal  erect    Strength:    Strength is V/V in the upper and lower limbs.      Sensation: intact to LT     Reflex Exam:  DTR's:    Deep tendon reflexes in the upper and lower extremities are normal bilaterally.   Toes:    The toes are downgoing bilaterally.   Clonus:    Clonus is absent.       Assessment/Plan:  31 year old with chronic headaches likely migrainous but given the intractable nature of the symptoms and eye pain/vision changes and the positional nature (she wakes with the headaches) will check an MRI brain for causes of intracranial increased pressure such as space-occupying lesion or IIH.  MRI brain w/wo contrast  As far as diagnostic testing: MRI brain, [facility] for migraine and musculoskeletal neck pain and right arm weakness/numbness with neck pain with massage and physical therapy and acupuncture  Remember to drink plenty of fluid, eat healthy meals and do not skip any meals. Try to eat protein with a every meal and eat a healthy snack such as fruit or nuts in between meals. Try to keep a regular sleep-wake schedule and try to exercise daily, particularly in the form of walking, 20-30 minutes a day, if you can.   As far as your medications are concerned, I would like to suggest: Nortriptyline 10mg  an hour before bed(one cap) may increase to 20mg  at night in 1-2 weeks if no side effects Sumatriptan at onset of headache. Please take one tablet at the onset of your headache. If it does not improve the symptoms please take one additional tablet in 2 hours. Do not take more then 2 tablets in 24hrs. Do not take use more then 2 to 3 days in a week.  I would like to see you  back in 3 months, sooner if we need to. Please call us with any interim questions, concerns, problems, updates or refill requests.   Our phone number is [phone]. We also have an after hours call service for urgent matters and there is a physician on-call for urgent questions. For any  emergencies you know to call 911 or go to the nearest emergency room  To prevent or relieve headaches, try the following: Cool Compress. Lie down and place a cool compress on your head.  Avoid headache triggers. If certain foods or odors seem to have triggered your migraines in the past, avoid them. A headache diary might help you identify triggers.  Include physical activity in your daily routine. Try a daily walk or other moderate aerobic exercise.  Manage stress. Find healthy ways to cope with the stressors, such as delegating tasks on your to-do list.  Practice relaxation techniques. Try deep breathing, yoga, massage and visualization.  Eat regularly. Eating regularly scheduled meals and maintaining a healthy diet might help prevent headaches. Also, drink plenty of fluids.  Follow a regular sleep schedule. Sleep deprivation might contribute to headaches Consider biofeedback. With this mind-body technique, you learn to control certain bodily functions - such as muscle tension, heart rate and blood pressure - to prevent headaches or reduce headache pain.    Proceed to emergency room if you experience new or worsening symptoms or symptoms do not resolve, if you have new neurologic symptoms or if headache is severe, or for any concerning symptom.    Cc: Dr. Modena Morrow, [facility] [address] [place], [place] [address]  Phone [phone] Fax [phone]

## 2016-01-24 NOTE — Patient Instructions (Addendum)
Overall you are doing fairly well but I do want to suggest a few things today:   Remember to drink plenty of fluid, eat healthy meals and do not skip any meals. Try to eat protein with a every meal and eat a healthy snack such as fruit or nuts in between meals. Try to keep a regular sleep-wake schedule and try to exercise daily, particularly in the form of walking, 20-30 minutes a day, if you can.   As far as your medications are concerned, I would like to suggest: Nortriptyline 10mg  an hour before bed(one cap) may increase to 20mg  at night in 1-2 weeks if no side effects Sumatriptan at onset of headache. Please take one tablet at the onset of your headache. If it does not improve the symptoms please take one additional tablet in 2 hours. Do not take more then 2 tablets in 24hrs. Do not take use more then 2 to 3 days in a week.  As far as diagnostic testing: MRI brain, [facility]  I would like to see you back in 3 months, sooner if we need to. Please call us with any interim questions, concerns, problems, updates or refill requests.   Our phone number is [phone]. We also have an after hours call service for urgent matters and there is a physician on-call for urgent questions. For any emergencies you know to call 911 or go to the nearest emergency room  To prevent or relieve headaches, try the following: Cool Compress. Lie down and place a cool compress on your head.  Avoid headache triggers. If certain foods or odors seem to have triggered your migraines in the past, avoid them. A headache diary might help you identify triggers.  Include physical activity in your daily routine. Try a daily walk or other moderate aerobic exercise.  Manage stress. Find healthy ways to cope with the stressors, such as delegating tasks on your to-do list.  Practice relaxation techniques. Try deep breathing, yoga, massage and visualization.  Eat regularly. Eating regularly scheduled meals and  maintaining a healthy diet might help prevent headaches. Also, drink plenty of fluids.  Follow a regular sleep schedule. Sleep deprivation might contribute to headaches Consider biofeedback. With this mind-body technique, you learn to control certain bodily functions - such as muscle tension, heart rate and blood pressure - to prevent headaches or reduce headache pain.    Proceed to emergency room if you experience new or worsening symptoms or symptoms do not resolve, if you have new neurologic symptoms or if headache is severe, or for any concerning symptom.   Migraine Headache A migraine headache is an intense, throbbing pain on one side or both sides of the head. Migraines may also cause other symptoms, such as nausea, vomiting, and sensitivity to light and noise. What are the causes? Doing or taking certain things may also trigger migraines, such as:  Alcohol.  Smoking.  Medicines, such as:  Medicine used to treat chest pain (nitroglycerine).  Birth control pills.  Estrogen pills.  Certain blood pressure medicines.  Aged cheeses, chocolate, or caffeine.  Foods or drinks that contain nitrates, glutamate, aspartame, or tyramine.  Physical activity. Other things that may trigger a migraine include:  Menstruation.  Pregnancy.  Hunger.  Stress, lack of sleep, too much sleep, or fatigue.  Weather changes. What increases the risk? The following factors may make you more likely to experience migraine headaches:  Age. Risk increases with age.  Family history of migraine headaches.  Being Caucasian.  Depression and anxiety.  Obesity.  Being a woman.  Having a hole in the heart (patent foramen ovale) or other heart problems. What are the signs or symptoms? The main symptom of this condition is pulsating or throbbing pain. Pain may:  Happen in any area of the head, such as on one side or both sides.  Interfere with daily activities.  Get worse with physical  activity.  Get worse with exposure to bright lights or loud noises. Other symptoms may include:  Nausea.  Vomiting.  Dizziness.  General sensitivity to bright lights, loud noises, or smells. Before you get a migraine, you may get warning signs that a migraine is developing (aura). An aura may include:  Seeing flashing lights or having blind spots.  Seeing bright spots, halos, or zigzag lines.  Having tunnel vision or blurred vision.  Having numbness or a tingling feeling.  Having trouble talking.  Having muscle weakness. How is this diagnosed? A migraine headache can be diagnosed based on:  Your symptoms.  A physical exam.  Tests, such as CT scan or MRI of the head. These imaging tests can help rule out other causes of headaches.  Taking fluid from the spine (lumbar puncture) and analyzing it (cerebrospinal fluid analysis, or CSF analysis). How is this treated? A migraine headache is usually treated with medicines that:  Relieve pain.  Relieve nausea.  Prevent migraines from coming back. Treatment may also include:  Acupuncture.  Lifestyle changes like avoiding foods that trigger migraines. Follow these instructions at home: Medicines  Take over-the-counter and prescription medicines only as told by your health care provider.  Do not drive or use heavy machinery while taking prescription pain medicine.  To prevent or treat constipation while you are taking prescription pain medicine, your health care provider may recommend that you:  Drink enough fluid to keep your urine clear or pale yellow.  Take over-the-counter or prescription medicines.  Eat foods that are high in fiber, such as fresh fruits and vegetables, whole grains, and beans.  Limit foods that are high in fat and processed sugars, such as fried and sweet foods. Lifestyle  Avoid alcohol use.  Do not use any products that contain nicotine or tobacco, such as cigarettes and e-cigarettes. If  you need help quitting, ask your health care provider.  Get at least 8 hours of sleep every night.  Limit your stress. General instructions  Keep a journal to find out what may trigger your migraine headaches. For example, write down:  What you eat and drink.  How much sleep you get.  Any change to your diet or medicines.  If you have a migraine:  Avoid things that make your symptoms worse, such as bright lights.  It may help to lie down in a dark, quiet room.  Do not drive or use heavy machinery.  Ask your health care provider what activities are safe for you while you are experiencing symptoms.  Keep all follow-up visits as told by your health care provider. This is important. Contact a health care provider if:  You develop symptoms that are different or more severe than your usual migraine symptoms. Get help right away if:  Your migraine becomes severe.  You have a fever.  You have a stiff neck.  You have vision loss.  Your muscles feel weak or like you cannot control them.  You start to lose your balance often.  You develop trouble walking.  You faint. This information is not intended to replace  advice given to you by your health care provider. Make sure you discuss any questions you have with your health care provider. Document Released: 12/26/2004 Document Revised: 07/16/2015 Document Reviewed: 06/14/2015 Elsevier Interactive Patient Education  2017 [place].  Sumatriptan tablets What is this medicine? SUMATRIPTAN (soo ma TRIP tan) is used to treat migraines with or without aura. An aura is a strange feeling or visual disturbance that warns you of an attack. It is not used to prevent migraines. This medicine may be used for other purposes; ask your health care provider or pharmacist if you have questions. COMMON BRAND NAME(S): Imitrex, Migraine Pack What should I tell my health care provider before I take this medicine? They need to know if you have  any of these conditions: -circulation problems in fingers and toes -diabetes -heart disease -high blood pressure -high cholesterol -history of irregular heartbeat -history of stroke -kidney disease -liver disease -postmenopausal or surgical removal of uterus and ovaries -seizures -smoke tobacco -stomach or intestine problems -an unusual or allergic reaction to sumatriptan, other medicines, foods, dyes, or preservatives -pregnant or trying to get pregnant -breast-feeding How should I use this medicine? Take this medicine by mouth with a glass of water. Follow the directions on the prescription label. This medicine is taken at the first symptoms of a migraine. It is not for everyday use. If your migraine headache returns after one dose, you can take another dose as directed. You must leave at least 2 hours between doses, and do not take more than 100 mg as a single dose. Do not take more than 200 mg total in any 24 hour period. If there is no improvement at all after the first dose, do not take a second dose without talking to your doctor or health care professional. Do not take your medicine more often than directed. Talk to your pediatrician regarding the use of this medicine in children. Special care may be needed. Overdosage: If you think you have taken too much of this medicine contact a poison control center or emergency room at once. NOTE: This medicine is only for you. Do not share this medicine with others. What if I miss a dose? This does not apply; this medicine is not for regular use. What may interact with this medicine? Do not take this medicine with any of the following medicines: -cocaine -ergot alkaloids like dihydroergotamine, ergonovine, ergotamine, methylergonovine -feverfew -MAOIs like Carbex, Eldepryl, Marplan, Nardil, and Parnate -other medicines for migraine headache like almotriptan, eletriptan, frovatriptan, naratriptan, rizatriptan,  zolmitriptan -tryptophan This medicine may also interact with the following medications: -certain medicines for depression, anxiety, or psychotic disturbances This list may not describe all possible interactions. Give your health care provider a list of all the medicines, herbs, non-prescription drugs, or dietary supplements you use. Also tell them if you smoke, drink alcohol, or use illegal drugs. Some items may interact with your medicine. What should I watch for while using this medicine? Only take this medicine for a migraine headache. Take it if you get warning symptoms or at the start of a migraine attack. It is not for regular use to prevent migraine attacks. You may get drowsy or dizzy. Do not drive, use machinery, or do anything that needs mental alertness until you know how this medicine affects you. To reduce dizzy or fainting spells, do not sit or stand up quickly, especially if you are an older patient. Alcohol can increase drowsiness, dizziness and flushing. Avoid alcoholic drinks. Smoking cigarettes may increase the  risk of heart-related side effects from using this medicine. If you take migraine medicines for 10 or more days a month, your migraines may get worse. Keep a diary of headache days and medicine use. Contact your healthcare professional if your migraine attacks occur more frequently. What side effects may I notice from receiving this medicine? Side effects that you should report to your doctor or health care professional as soon as possible: -allergic reactions like skin rash, itching or hives, swelling of the face, lips, or tongue -bloody or watery diarrhea -hallucination, loss of contact with reality -pain, tingling, numbness in the face, hands, or feet -seizures -signs and symptoms of a blood clot such as breathing problems; changes in vision; chest pain; severe, sudden headache; pain, swelling, warmth in the leg; trouble speaking; sudden numbness or weakness of the  face, arm, or leg -signs and symptoms of a dangerous change in heartbeat or heart rhythm like chest pain; dizziness; fast or irregular heartbeat; palpitations, feeling faint or lightheaded; falls; breathing problems -signs and symptoms of a stroke like changes in vision; confusion; trouble speaking or understanding; severe headaches; sudden numbness or weakness of the face, arm, or leg; trouble walking; dizziness; loss of balance or coordination -stomach pain Side effects that usually do not require medical attention (report to your doctor or health care professional if they continue or are bothersome): -changes in taste -facial flushing -headache -muscle cramps -muscle pain -nausea, vomiting -weak or tired This list may not describe all possible side effects. Call your doctor for medical advice about side effects. You may report side effects to FDA at 1-800-FDA-1088. Where should I keep my medicine? Keep out of the reach of children. Store at room temperature between 2 and 30 degrees C (36 and 86 degrees F). Throw away any unused medicine after the expiration date. NOTE: This sheet is a summary. It may not cover all possible information. If you have questions about this medicine, talk to your doctor, pharmacist, or health care provider.  2017 Elsevier/Gold Standard (2015-01-28 12:38:23)   Nortriptyline capsules What is this medicine? NORTRIPTYLINE (nor TRIP ti leen) is used to treat depression. This medicine may be used for other purposes; ask your health care provider or pharmacist if you have questions. COMMON BRAND NAME(S): Aventyl, Pamelor What should I tell my health care provider before I take this medicine? They need to know if you have any of these conditions: -an alcohol problem -bipolar disorder or schizophrenia -difficulty passing urine, prostate trouble -glaucoma -heart disease or recent heart attack -liver disease -over active thyroid -seizures -thoughts or plans of  suicide or a previous suicide attempt or family history of suicide attempt -an unusual or allergic reaction to nortriptyline, other medicines, foods, dyes, or preservatives -pregnant or trying to get pregnant -breast-feeding How should I use this medicine? Take this medicine by mouth with a glass of water. Follow the directions on the prescription label. Take your doses at regular intervals. Do not take it more often than directed. Do not stop taking this medicine suddenly except upon the advice of your doctor. Stopping this medicine too quickly may cause serious side effects or your condition may worsen. A special MedGuide will be given to you by the pharmacist with each prescription and refill. Be sure to read this information carefully each time. Talk to your pediatrician regarding the use of this medicine in children. Special care may be needed. Overdosage: If you think you have taken too much of this medicine contact a poison control  center or emergency room at once. NOTE: This medicine is only for you. Do not share this medicine with others. What if I miss a dose? If you miss a dose, take it as soon as you can. If it is almost time for your next dose, take only that dose. Do not take double or extra doses. What may interact with this medicine? Do not take this medicine with any of the following medications: -arsenic trioxide -certain medicines medicines for irregular heart beat -cisapride -halofantrine -linezolid -MAOIs like Carbex, Eldepryl, Marplan, Nardil, and Parnate -methylene blue (injected into a vein) -other medicines for mental depression -phenothiazines like perphenazine, thioridazine and chlorpromazine -pimozide -probucol -procarbazine -sparfloxacin -St. John's Wort -ziprasidone This medicine may also interact with any of the following medications: -atropine and related drugs like hyoscyamine, scopolamine, tolterodine and others -barbiturate medicines for inducing  sleep or treating seizures, such as phenobarbital -cimetidine -medicines for diabetes -medicines for seizures like carbamazepine or phenytoin -reserpine -thyroid medicine This list may not describe all possible interactions. Give your health care provider a list of all the medicines, herbs, non-prescription drugs, or dietary supplements you use. Also tell them if you smoke, drink alcohol, or use illegal drugs. Some items may interact with your medicine. What should I watch for while using this medicine? Tell your doctor if your symptoms do not get better or if they get worse. Visit your doctor or health care professional for regular checks on your progress. Because it may take several weeks to see the full effects of this medicine, it is important to continue your treatment as prescribed by your doctor. Patients and their families should watch out for new or worsening thoughts of suicide or depression. Also watch out for sudden changes in feelings such as feeling anxious, agitated, panicky, irritable, hostile, aggressive, impulsive, severely restless, overly excited and hyperactive, or not being able to sleep. If this happens, especially at the beginning of treatment or after a change in dose, call your health care professional. Dennis Bast may get drowsy or dizzy. Do not drive, use machinery, or do anything that needs mental alertness until you know how this medicine affects you. Do not stand or sit up quickly, especially if you are an older patient. This reduces the risk of dizzy or fainting spells. Alcohol may interfere with the effect of this medicine. Avoid alcoholic drinks. Do not treat yourself for coughs, colds, or allergies without asking your doctor or health care professional for advice. Some ingredients can increase possible side effects. Your mouth may get dry. Chewing sugarless gum or sucking hard candy, and drinking plenty of water may help. Contact your doctor if the problem does not go away or  is severe. This medicine may cause dry eyes and blurred vision. If you wear contact lenses you may feel some discomfort. Lubricating drops may help. See your eye doctor if the problem does not go away or is severe. This medicine can cause constipation. Try to have a bowel movement at least every 2 to 3 days. If you do not have a bowel movement for 3 days, call your doctor or health care professional. This medicine can make you more sensitive to the sun. Keep out of the sun. If you cannot avoid being in the sun, wear protective clothing and use sunscreen. Do not use sun lamps or tanning beds/booths. What side effects may I notice from receiving this medicine? Side effects that you should report to your doctor or health care professional as soon as possible: -allergic  reactions like skin rash, itching or hives, swelling of the face, lips, or tongue -anxious -breathing problems -changes in vision -confusion -elevated mood, decreased need for sleep, racing thoughts, impulsive behavior -eye pain -fast, irregular heartbeat -feeling faint or lightheaded, falls -feeling agitated, angry, or irritable -fever with increased sweating -hallucination, loss of contact with reality -seizures -stiff muscles -suicidal thoughts or other mood changes -tingling, pain, or numbness in the feet or hands -trouble passing urine or change in the amount of urine -trouble sleeping -unusually weak or tired -vomiting -yellowing of the eyes or skin Side effects that usually do not require medical attention (report to your doctor or health care professional if they continue or are bothersome): -change in sex drive or performance -change in appetite or weight -constipation -dizziness -dry mouth -nausea -tired -tremors -upset stomach This list may not describe all possible side effects. Call your doctor for medical advice about side effects. You may report side effects to FDA at 1-800-FDA-1088. Where should I  keep my medicine? Keep out of the reach of children. Store at room temperature between 15 and 30 degrees C (59 and 86 degrees F). Keep container tightly closed. Throw away any unused medicine after the expiration date. NOTE: This sheet is a summary. It may not cover all possible information. If you have questions about this medicine, talk to your doctor, pharmacist, or health care provider.  2017 Elsevier/Gold Standard (2015-05-28 12:53:08)

## 2016-01-25 MED FILL — SUMATRIPTAN SUCC 50 MG TAB: 50 | 30 days supply | Qty: 18 | Fill #0

## 2016-01-25 MED FILL — NORTRIPTYLINE HCL 10 MG CAP: 10 | 30 days supply | Qty: 60 | Fill #0

## 2016-01-26 ENCOUNTER — Encounter: Payer: Self-pay | Admitting: Neurology

## 2016-01-28 ENCOUNTER — Encounter: Payer: Self-pay | Admitting: *Deleted

## 2016-01-28 NOTE — Progress Notes (Signed)
Faxed referral to [facility] for PT, manual therapy/massage, acupuncture, pain/stress management.  Dx: migraines, cervical muscle pain and right arm radicular symptoms.  Included office note, demo sheet, insurance info. Fax: [phone]. Received confirmation. \

## 2016-02-02 DIAGNOSIS — M797 Fibromyalgia: Secondary | ICD-10-CM | POA: Diagnosis not present

## 2016-02-03 ENCOUNTER — Telehealth: Payer: Self-pay | Admitting: Neurology

## 2016-02-03 NOTE — Telephone Encounter (Signed)
Patient returning call. Please call.

## 2016-02-03 NOTE — Telephone Encounter (Signed)
I spoke with patient about her MRI. She informed me that she wants her MRI at [facility]. She is scheduled for 02/11/16 @ 1:00pm.

## 2016-02-08 DIAGNOSIS — M797 Fibromyalgia: Secondary | ICD-10-CM | POA: Diagnosis not present

## 2016-02-11 ENCOUNTER — Telehealth: Payer: Self-pay | Admitting: Family Medicine

## 2016-02-11 ENCOUNTER — Ambulatory Visit (INDEPENDENT_AMBULATORY_CARE_PROVIDER_SITE_OTHER)
Admission: RE | Admit: 2016-02-11 | Discharge: 2016-02-11 | Disposition: A | Discharge status: Home / Self Care | Payer: 59 | Source: Ambulatory Visit | Attending: Family Medicine | Admitting: Family Medicine

## 2016-02-11 ENCOUNTER — Ambulatory Visit: Payer: 59

## 2016-02-11 DIAGNOSIS — M545 Low back pain, unspecified: Secondary | ICD-10-CM | POA: Insufficient documentation

## 2016-02-11 DIAGNOSIS — G8929 Other chronic pain: Secondary | ICD-10-CM | POA: Diagnosis not present

## 2016-02-11 DIAGNOSIS — Z981 Arthrodesis status: Secondary | ICD-10-CM | POA: Diagnosis not present

## 2016-02-11 DIAGNOSIS — M4326 Fusion of spine, lumbar region: Secondary | ICD-10-CM | POA: Diagnosis not present

## 2016-02-11 NOTE — Telephone Encounter (Signed)
-----   Message from [name], MD sent at 02/11/2016 10:13 AM EST ----- Regarding: RE: back pain Dr. Glori Bickers,    I think that would work out fine. We can come late afternoon 2pm or later. I was hoping we can get flexion/extension series and make sure her fusion is working fine. Thanks for accommodating [place].  Tasrif.   ----- Message ----- From: Abner Greenspan, MD Sent: 02/11/2016   8:00 AM To: [name], MD Subject: RE: back pain                                  I'm full, given the fact it is flu season.  Perhaps I can order an xray of her LS and she can just come in for that - I would be able to have it read by Monday or over the weekend if it is done in the late afternoon  Let me know if she could come in for just the xray  ----- Message ----- From: [name], MD Sent: 02/10/2016   7:35 PM To: Abner Greenspan, MD Subject: back pain                                      Dr. Glori Bickers,    I wanted to reach out to you about Nur again. We saw the neurologist and taking nortryptiline for the headache.   She is having lot of pain over her lower left spine and was wondering if she should come see you and get an xray and evaluate her spine screws. It's not getting better with massage, muscle relaxers, and stretching.  I can bring her tomorrow if you have any opening late afternoon.  Thanks, Tasrif

## 2016-02-11 NOTE — Telephone Encounter (Signed)
This pt is coming for an xray sometime after 2 pm -she is not seeing me  Thanks

## 2016-02-18 DIAGNOSIS — M797 Fibromyalgia: Secondary | ICD-10-CM | POA: Diagnosis not present

## 2016-03-27 MED FILL — NORTRIPTYLINE HCL 10 MG CAP: 10 | 30 days supply | Qty: 60 | Fill #1

## 2016-04-12 MED FILL — FLUTICASONE PROP 50 MCG SPR: 50 | 60 days supply | Qty: 16 | Fill #0

## 2016-04-12 MED FILL — OMEPRAZOLE DR 20 MG CAPSULE: 20 | 90 days supply | Qty: 180 | Fill #0

## 2016-04-24 ENCOUNTER — Ambulatory Visit: Payer: 59 | Admitting: Neurology

## 2016-04-28 DIAGNOSIS — L821 Other seborrheic keratosis: Secondary | ICD-10-CM | POA: Diagnosis not present

## 2016-05-02 MED FILL — NORTRIPTYLINE HCL 10 MG CAP: 10 | 30 days supply | Qty: 60 | Fill #2

## 2016-05-19 ENCOUNTER — Ambulatory Visit (INDEPENDENT_AMBULATORY_CARE_PROVIDER_SITE_OTHER): Payer: 59 | Admitting: Family Medicine

## 2016-05-19 ENCOUNTER — Encounter: Payer: Self-pay | Admitting: Family Medicine

## 2016-05-19 VITALS — BP 102/74 | HR 114 | Temp 98.4°F | Wt 179.0 lb

## 2016-05-19 DIAGNOSIS — J069 Acute upper respiratory infection, unspecified: Secondary | ICD-10-CM

## 2016-05-19 DIAGNOSIS — B349 Viral infection, unspecified: Secondary | ICD-10-CM | POA: Diagnosis not present

## 2016-05-19 MED ORDER — AMOXICILLIN-POT CLAVULANATE 875-125 MG PO TABS: 1.0000 | ORAL_TABLET | Freq: Two times a day (BID) | ORAL | 0 refills | Status: DC

## 2016-05-19 NOTE — Patient Instructions (Signed)
We'll contact you with your lab report. Rest and fluids in the meantime.  If more facial or ear pain, then start the antibiotics.  Update [place] as needed.  Take care.  Glad to see you.

## 2016-05-19 NOTE — Progress Notes (Signed)
Sx started in the last week with likely allergy sx, worse in the last 2-3 days, >48 hours ago.  Felt diffusely weak.  Joints ache.  Facial pain and ear pain, green mucous/discharge. Congestion.  Using neti pot and flonse.   She had gum tingling with oxymetazoline use.  Lightheaded on standing prev but not now.  She has some nonspecific symptoms recently where she noted some pain with swallowing and a slight sensation of tightening in the throat yesterday. There was no identified trigger for this. She has no symptoms right now. She has had no such symptoms today.  Meds, vitals, and allergies reviewed.   ROS: Per HPI unless specifically indicated in ROS section   GEN: nad, alert and oriented HEENT: mucous membranes moist, tm w/o erythema or bulging but she does have some injection of the blood vessels on the tympanic membranes superiorly bilaterally, nasal exam w/o erythema, clear discharge noted,  OP with cobblestoning, bilateral conjunctiva injected without discharge, left greater than right maxillary sinus tender to palpation. NECK: supple w/o LA except for a slightly enlarged lymph node near the angle of the jaw on the right side, no stridor CV: rrr.   PULM: ctab, no inc wob, no wheeze, no focal decrease in breath sounds EXT: no edema SKIN: no acute rash

## 2016-05-21 DIAGNOSIS — B349 Viral infection, unspecified: Secondary | ICD-10-CM | POA: Insufficient documentation

## 2016-05-21 NOTE — Assessment & Plan Note (Addendum)
Discussed with patient and her husband together at the office visit today. Likely a benign viral syndrome. She does not feel well but she is clearly not toxic. She does not feel like she needs to be at the hospital and I don't think she needs to be there now either. Discussed with her about differential diagnosis. This is a detailed conversation. It is less likely for her to have the flu at this point in the season as flu cases have gone down dramatically. We do not have a flu test available in the office at the time of the exam. In any event, even if she did have the flu, she has had symptoms for greater than 48 hours and she is nontoxic and Tamiflu would likely be of limited benefit. Discussed with patient. This would make flu testing, even if available, likely of no benefit at this point. Is more likely that she has another benign viral process that should resolve. She is okay for outpatient follow-up. Supportive care the meantime. Update [place] as needed. It is reasonable to check her CBC just to make sure this was not significantly elevated. I have checked for that result on the night of the office visit and also over the weekend, but I do not see this resulted yet. In the meantime I printed off a prescription for Augmentin. She is to hold this for now. In the event that she had significant increasing unilateral ear pain, or if she had significant unilateral maxillary tenderness that worsened, then it may be reasonable to start Augmentin, especially of her symptoms are prolonged. At this point, supportive treatment otherwise. Hold antibiotic prescription for now. Update [place] as needed. She agrees. >25 minutes spent in face to face time with patient, >50% spent in counselling or coordination of care.   She has no stridor at this point. I do not see an ominous cause for her throat symptoms yesterday. She has no airway findings that are significant at this point. It is unclear to me if any of her symptoms were  related to oxymetazoline use. Would avoid that medication in general. Discussed with patient.

## 2016-05-22 LAB — CBC WITH DIFFERENTIAL/PLATELET
BASOS ABS: 0 10*3/uL (ref 0.0–0.1)
Basophils Relative: 0.3 % (ref 0.0–3.0)
EOS ABS: 0.2 10*3/uL (ref 0.0–0.7)
Eosinophils Relative: 2.7 % (ref 0.0–5.0)
HEMATOCRIT: 38 % (ref 36.0–46.0)
HEMOGLOBIN: 13.3 g/dL (ref 12.0–15.0)
LYMPHS PCT: 15.2 % (ref 12.0–46.0)
Lymphs Abs: 1.4 10*3/uL (ref 0.7–4.0)
MCHC: 34.9 g/dL (ref 30.0–36.0)
MCV: 84.3 fl (ref 78.0–100.0)
MONOS PCT: 5.8 % (ref 3.0–12.0)
Monocytes Absolute: 0.5 10*3/uL (ref 0.1–1.0)
NEUTROS ABS: 6.9 10*3/uL (ref 1.4–7.7)
Neutrophils Relative %: 76 % (ref 43.0–77.0)
Platelets: 225 10*3/uL (ref 150.0–400.0)
RBC: 4.51 Mil/uL (ref 3.87–5.11)
RDW: 13.3 % (ref 11.5–15.5)
WBC: 9 10*3/uL (ref 4.0–10.5)

## 2016-05-26 ENCOUNTER — Telehealth: Payer: Self-pay

## 2016-05-26 MED ORDER — BENZONATATE 200 MG PO CAPS: 200.0000 mg | ORAL_CAPSULE | Freq: Three times a day (TID) | ORAL | 0 refills | Status: DC | PRN

## 2016-05-26 MED FILL — BENZONATATE 200 MG CAPSULE: 200 | 10 days supply | Qty: 30 | Fill #0

## 2016-05-26 NOTE — Telephone Encounter (Signed)
Would take tessalon for cough- sent; continue flonase and claritin for congestion.  Glad she is better.  Thanks.

## 2016-05-26 NOTE — Telephone Encounter (Signed)
Husband advised. 

## 2016-05-26 NOTE — Telephone Encounter (Signed)
pts husband (DPR signed) left v/m; pt was seen 05/19/16; pt is better with all symptoms except the cough and congestion; pt has been taking delsym, mucinex DM that is not helping cough and congestion. Request stronger med for cough and congestion. pts husband request cb. If before 5pm send to [place] and if after 5 pm send to [place].

## 2016-06-01 MED FILL — NORTRIPTYLINE HCL 10 MG CAP: 10 | 30 days supply | Qty: 60 | Fill #3

## 2016-06-08 ENCOUNTER — Telehealth: Payer: Self-pay | Admitting: Neurology

## 2016-06-08 NOTE — Telephone Encounter (Signed)
Pt husband calling because wife is having really bad head aches and 1st appointment for Dr Jaynee Eagles is early July.  Pt husband wanting a call back with other suggestions, NP Jinny Blossom 1st available is past Dr Cathren Laine 1st.

## 2016-06-08 NOTE — Telephone Encounter (Signed)
Returned call to husband. Work-in appt scheduled tomorrow at 11:30. Pt agreed to arrive at least 15 min prior to appt time.

## 2016-06-09 ENCOUNTER — Encounter: Payer: Self-pay | Admitting: Neurology

## 2016-06-09 ENCOUNTER — Ambulatory Visit (INDEPENDENT_AMBULATORY_CARE_PROVIDER_SITE_OTHER): Payer: 59 | Admitting: Neurology

## 2016-06-09 VITALS — BP 126/80 | HR 115 | Ht 65.0 in | Wt 174.6 lb

## 2016-06-09 DIAGNOSIS — G43719 Chronic migraine without aura, intractable, without status migrainosus: Secondary | ICD-10-CM

## 2016-06-09 DIAGNOSIS — M542 Cervicalgia: Secondary | ICD-10-CM

## 2016-06-09 MED ORDER — ONDANSETRON 4 MG PO TBDP: 4.0000 mg | ORAL_TABLET | Freq: Three times a day (TID) | ORAL | 12 refills | Status: AC | PRN

## 2016-06-09 MED ORDER — RIZATRIPTAN BENZOATE 10 MG PO TABS: 10.0000 mg | ORAL_TABLET | ORAL | 11 refills | Status: AC | PRN

## 2016-06-09 MED ORDER — KETOROLAC TROMETHAMINE 60 MG/2ML IM SOLN
30.0000 mg | Freq: Once | INTRAMUSCULAR | Status: AC
  Administered 2016-06-09: 30 mg via INTRAMUSCULAR

## 2016-06-09 MED ORDER — PROCHLORPERAZINE EDISYLATE 5 MG/ML IJ SOLN
20.0000 mg | Freq: Once | INTRAMUSCULAR | Status: AC
  Administered 2016-06-09: 20 mg via INTRAMUSCULAR

## 2016-06-09 MED FILL — ONDANSETRON ODT 4 MG TABLET: 4 | 10 days supply | Qty: 30 | Fill #0

## 2016-06-09 MED FILL — RIZATRIPTAN 10 MG TABLET: 10 | 30 days supply | Qty: 10 | Fill #0

## 2016-06-09 NOTE — Progress Notes (Deleted)
Nerve block:  Xylocaine 2% (20 mg/ml) NDC 89842-103-12 Lot [id number] Exp 11/20  Bupivacaine 0.25% (5mg /ml) NDC 73736-681-59 LOT [id number] Exp: 03/22  Four 3 ml syringes: 1.5 ml marcaine and 1.5 ml lidocaine

## 2016-06-09 NOTE — Patient Instructions (Addendum)
Remember to drink plenty of fluid, eat healthy meals and do not skip any meals. Try to eat protein with a every meal and eat a healthy snack such as fruit or nuts in between meals. Try to keep a regular sleep-wake schedule and try to exercise daily, particularly in the form of walking, 20-30 minutes a day, if you can.   As far as your medications are concerned, I would like to suggest:  Maxalt and zofran: Please take one tablet at the onset of your headache. If it does not improve the symptoms please take one additional tablet in 2 hours. Do not take more then 2 tablets in 24hrs. Do not take use more then 2 to 3 times in a week.  Continue Nortriptyline  ERENUMAB   As far as diagnostic testing: MRI brain Monday  I would like to see you back in 8 weeks, sooner if we need to. Please call us with any interim questions, concerns, problems, updates or refill requests.   Our phone number is [phone]. We also have an after hours call service for urgent matters and there is a physician on-call for urgent questions. For any emergencies you know to call 911 or go to the nearest emergency room  Ketorolac injection What is this medicine? KETOROLAC (kee toe ROLE ak) is a non-steroidal anti-inflammatory drug (NSAID). It is used to treat moderate to severe pain for up to 5 days. It is commonly used after surgery. This medicine should not be used for more than 5 days. This medicine may be used for other purposes; ask your health care provider or pharmacist if you have questions. COMMON BRAND NAME(S): Toradol What should I tell my health care provider before I take this medicine? They need to know if you have any of these conditions: -asthma, especially aspirin-sensitive asthma -bleeding problems -kidney disease -stomach bleed, ulcer, or other problem -taking aspirin, other NSAID, or probenecid -an unusual or allergic reaction to ketorolac, tromethamine, aspirin, other NSAIDs, other medicines, foods,  dyes or preservatives -pregnant or trying to get pregnant -breast-feeding How should I use this medicine? This medicine is for injection into a muscle or into a vein. It is given by a health care professional in a hospital or clinic setting. Talk to your pediatrician regarding the use of this medicine in children. Special care may be needed. Patients over 77 years old may have a stronger reaction and need a smaller dose. Overdosage: If you think you have taken too much of this medicine contact a poison control center or emergency room at once. NOTE: This medicine is only for you. Do not share this medicine with others. What if I miss a dose? This does not apply. What may interact with this medicine? Do not take this medicine with any of the following medications: -aspirin and aspirin-like medicines -cidofovir -methotrexate -NSAIDs, medicines for pain and inflammation, like ibuprofen or naproxen -pentoxifylline -probenecid This medicine may also interact with the following medications: -alcohol -alendronate -alprazolam -carbamazepine -diuretics -flavocoxid -fluoxetine -ginkgo -lithium -medicines for blood pressure like enalapril -medicines that affect platelets like pentoxifylline -medicines that treat or prevent blood clots like heparin, warfarin -muscle relaxants -pemetrexed -phenytoin -thiothixene This list may not describe all possible interactions. Give your health care provider a list of all the medicines, herbs, non-prescription drugs, or dietary supplements you use. Also tell them if you smoke, drink alcohol, or use illegal drugs. Some items may interact with your medicine. What should I watch for while using this medicine? Tell your  doctor or healthcare professional if your symptoms do not start to get better or if they get worse. This medicine does not prevent heart attack or stroke. In fact, this medicine may increase the chance of a heart attack or stroke. The chance  may increase with longer use of this medicine and in people who have heart disease. If you take aspirin to prevent heart attack or stroke, talk with your doctor or health care professional. Do not take medicines such as ibuprofen and naproxen with this medicine. Side effects such as stomach upset, nausea, or ulcers may be more likely to occur. Many medicines available without a prescription should not be taken with this medicine. This medicine can cause ulcers and bleeding in the stomach and intestines at any time during treatment. Do not smoke cigarettes or drink alcohol. These increase irritation to your stomach and can make it more susceptible to damage from this medicine. Ulcers and bleeding can happen without warning symptoms and can cause death. This medicine can cause you to bleed more easily. Try to avoid damage to your teeth and gums when you brush or floss your teeth. What side effects may I notice from receiving this medicine? Side effects that you should report to your doctor or health care professional as soon as possible: -allergic reactions like skin rash, itching or hives, swelling of the face, lips, or tongue -breathing problems -high blood pressure -nausea, vomiting -redness, blistering, peeling or loosening of the skin, including inside the mouth -severe stomach pain -signs and symptoms of bleeding such as bloody or black, tarry stools; red or dark-brown urine; spitting up blood or brown material that looks like coffee grounds; red spots on the skin; unusual bruising or bleeding from the eye, gums, or nose -signs and symptoms of a blood clot changes in vision; chest pain; severe, sudden headache; trouble speaking; sudden numbness or weakness of the face, arm, or leg -trouble passing urine or change in the amount of urine -unexplained weight gain or swelling -unusually weak or tired -yellowing of eyes or skin Side effects that usually do not require medical attention (report to  your doctor or health care professional if they continue or are bothersome): -diarrhea -dizziness -headache -heartburn This list may not describe all possible side effects. Call your doctor for medical advice about side effects. You may report side effects to FDA at 1-800-FDA-1088. Where should I keep my medicine? This drug is given in a hospital or clinic and will not be stored at home. NOTE: This sheet is a summary. It may not cover all possible information. If you have questions about this medicine, talk to your doctor, pharmacist, or health care provider.  2018 [employer] Standard (2016-01-05 14:38:40)   Prochlorperazine injection What is this medicine? PROCHLORPERAZINE (proe klor PER a zeen) helps to control severe nausea and vomiting. This medicine is also used to treat schizophrenia. It can also help patients who experience anxiety that is not due to psychological illness. This medicine may be used for other purposes; ask your health care provider or pharmacist if you have questions. COMMON BRAND NAME(S): Compazine What should I tell my health care provider before I take this medicine? They need to know if you have any of these conditions: -blood disorders or disease -dementia -liver disease or jaundice -Parkinson's disease -uncontrollable movement disorder -an unusual or allergic reaction to prochlorperazine, other medicines, foods, dyes, or preservatives -pregnant or trying to get pregnant -breast-feeding How should I use this medicine? This medicine is for injection  into a muscle, or injection or infusion into a vein. It is given by a health care professional in a hospital or clinic setting. Talk to your pediatrician regarding the use of this medicine in children. While this drug may be prescribed for children as young as 13 years of age for selected conditions, precautions do apply. Overdosage: If you think you have taken too much of this medicine contact a poison control  center or emergency room at once. NOTE: This medicine is only for you. Do not share this medicine with others. What if I miss a dose? This does not apply. What may interact with this medicine? Do not take this medicine with any of the following medications: -amoxapine -antidepressants like citalopram, escitalopram, fluoxetine, paroxetine, and sertraline -deferoxamine -dofetilide -maprotiline -tricyclic antidepressants like amitriptyline, clomipramine, imipramine, nortriptyline and others This medicine may also interact with the following medications: -lithium -medicines for pain -phenytoin -propranolol -warfarin This list may not describe all possible interactions. Give your health care provider a list of all the medicines, herbs, non-prescription drugs, or dietary supplements you use. Also tell them if you smoke, drink alcohol, or use illegal drugs. Some items may interact with your medicine. What should I watch for while using this medicine? Your condition will be monitored carefully while you are receiving this medicine. You may get drowsy or dizzy. Do not drive, use machinery, or do anything that needs mental alertness until you know how this medicine affects you. Do not stand or sit up quickly, especially if you are an older patient. This reduces the risk of dizzy or fainting spells. Alcohol may interfere with the effect of this medicine. Avoid alcoholic drinks. This medicine can reduce the response of your body to heat or cold. Dress warm in cold weather and stay hydrated in hot weather. If possible, avoid extreme temperatures like saunas, hot tubs, very hot or cold showers, or activities that can cause dehydration such as vigorous exercise. This medicine can make you more sensitive to the sun. Keep out of the sun. If you cannot avoid being in the sun, wear protective clothing and use sunscreen. Do not use sun lamps or tanning beds/booths. Your mouth may get dry. Chewing sugarless gum  or sucking hard candy, and drinking plenty of water may help. Contact your doctor if the problem does not go away or is severe. What side effects may I notice from receiving this medicine? Side effects that you should report to your doctor or health care professional as soon as possible: -abnormal production of milk in females -allergic reactions like skin rash, itching or hives, swelling of the face, lips, or tongue -blurred vision -breast enlargement in both males and females -breathing problems -chest pain, fast or irregular heartbeat -confusion, restlessness -dark yellow or brown urine -dizziness or fainting spells -drooling, shaking, movement difficulty, or rigidity -fever, chills, sore throat -involuntary or uncontrollable movements of the eyes, mouth, head, arms, and legs -seizures -stomach area pain -unusually weak or tired -unusual bleeding or bruising -yellowing of skin or eyes Side effects that usually do not require medical attention (report to your doctor or health care professional if they continue or are bothersome): -difficulty passing urine -difficulty sleeping -headache -sexual dysfunction This list may not describe all possible side effects. Call your doctor for medical advice about side effects. You may report side effects to FDA at 1-800-FDA-1088. Where should I keep my medicine? This drug is given in a hospital or clinic and will not be stored at home. NOTE:  This sheet is a summary. It may not cover all possible information. If you have questions about this medicine, talk to your doctor, pharmacist, or health care provider.  2018 Else[employer]/Gold Standard (2011-05-16 16:58:03)   Ondansetron oral dissolving tablet What is this medicine? ONDANSETRON (on DAN se tron) is used to treat nausea and vomiting caused by chemotherapy. It is also used to prevent or treat nausea and vomiting after surgery. This medicine may be used for other purposes; ask your health care  provider or pharmacist if you have questions. COMMON BRAND NAME(S): Zofran ODT What should I tell my health care provider before I take this medicine? They need to know if you have any of these conditions: -heart disease -history of irregular heartbeat -liver disease -low levels of magnesium or potassium in the blood -an unusual or allergic reaction to ondansetron, granisetron, other medicines, foods, dyes, or preservatives -pregnant or trying to get pregnant -breast-feeding How should I use this medicine? These tablets are made to dissolve in the mouth. Do not try to push the tablet through the foil backing. With dry hands, peel away the foil backing and gently remove the tablet. Place the tablet in the mouth and allow it to dissolve, then swallow. While you may take these tablets with water, it is not necessary to do so. Talk to your pediatrician regarding the use of this medicine in children. Special care may be needed. Overdosage: If you think you have taken too much of this medicine contact a poison control center or emergency room at once. NOTE: This medicine is only for you. Do not share this medicine with others. What if I miss a dose? If you miss a dose, take it as soon as you can. If it is almost time for your next dose, take only that dose. Do not take double or extra doses. What may interact with this medicine? Do not take this medicine with any of the following medications: -apomorphine -certain medicines for fungal infections like fluconazole, itraconazole, ketoconazole, posaconazole, voriconazole -cisapride -dofetilide -dronedarone -pimozide -thioridazine -ziprasidone This medicine may also interact with the following medications: -carbamazepine -certain medicines for depression, anxiety, or psychotic disturbances -fentanyl -linezolid -MAOIs like Carbex, Eldepryl, Marplan, Nardil, and Parnate -methylene blue (injected into a vein) -other medicines that prolong the QT  interval (cause an abnormal heart rhythm) -phenytoin -rifampicin -tramadol This list may not describe all possible interactions. Give your health care provider a list of all the medicines, herbs, non-prescription drugs, or dietary supplements you use. Also tell them if you smoke, drink alcohol, or use illegal drugs. Some items may interact with your medicine. What should I watch for while using this medicine? Check with your doctor or health care professional as soon as you can if you have any sign of an allergic reaction. What side effects may I notice from receiving this medicine? Side effects that you should report to your doctor or health care professional as soon as possible: -allergic reactions like skin rash, itching or hives, swelling of the face, lips, or tongue -breathing problems -confusion -dizziness -fast or irregular heartbeat -feeling faint or lightheaded, falls -fever and chills -loss of balance or coordination -seizures -sweating -swelling of the hands and feet -tightness in the chest -tremors -unusually weak or tired Side effects that usually do not require medical attention (report to your doctor or health care professional if they continue or are bothersome): -constipation or diarrhea -headache This list may not describe all possible side effects. Call your doctor for medical  advice about side effects. You may report side effects to FDA at 1-800-FDA-1088. Where should I keep my medicine? Keep out of the reach of children. Store between 2 and 30 degrees C (36 and 86 degrees F). Throw away any unused medicine after the expiration date. NOTE: This sheet is a summary. It may not cover all possible information. If you have questions about this medicine, talk to your doctor, pharmacist, or health care provider.  2018 Else[employer]/Gold Standard (2012-10-02 16:21:52)   Rizatriptan disintegrating tablets What is this medicine? RIZATRIPTAN (rye za TRIP tan) is used to treat  migraines with or without aura. An aura is a strange feeling or visual disturbance that warns you of an attack. It is not used to prevent migraines. This medicine may be used for other purposes; ask your health care provider or pharmacist if you have questions. COMMON BRAND NAME(S): Maxalt-MLT What should I tell my health care provider before I take this medicine? They need to know if you have any of these conditions: -bowel disease or colitis -diabetes -family history of heart disease -fast or irregular heart beat -heart or blood vessel disease, angina (chest pain), or previous heart attack -high blood pressure -high cholesterol -history of stroke, transient ischemic attacks (TIAs or mini-strokes), or intracranial bleeding -kidney or liver disease -overweight -poor circulation -postmenopausal or surgical removal of uterus and ovaries -an unusual or allergic reaction to rizatriptan, other medicines, foods, dyes, or preservatives -pregnant or trying to get pregnant -breast-feeding How should I use this medicine? Take this medicine by mouth. Follow the directions on the prescription label. This medicine is taken at the first symptoms of a migraine. It is not for everyday use. Leave the tablet in the foil package until you are ready to take it. Do not push the tablet through the blister pack. Peel open the blister pack with dry hands and place the tablet on your tongue. The tablet will dissolve rapidly and be swallowed in your saliva. It is not necessary to drink any water to take this medicine. If your migraine headache returns after one dose, you can take another dose as directed. You must leave at least 2 hours between doses, and do not take more than 30 mg total in 24 hours. If there is no improvement at all after the first dose, do not take a second dose without talking to your doctor or health care professional. Do not take your medicine more often than directed. Talk to your pediatrician  regarding the use of this medicine in children. While this drug may be prescribed for children as young as 6 years for selected conditions, precautions do apply. Overdosage: If you think you have taken too much of this medicine contact a poison control center or emergency room at once. NOTE: This medicine is only for you. Do not share this medicine with others. What if I miss a dose? This does not apply; this medicine is not for regular use. What may interact with this medicine? Do not take this medicine with any of the following medicines: -amphetamine, dextroamphetamine or cocaine -dihydroergotamine, ergotamine, ergoloid mesylates, methysergide, or ergot-type medication - do not take within 24 hours of taking rizatriptan -feverfew -MAOIs like Carbex, Eldepryl, Marplan, Nardil, and Parnate - do not take rizatriptan within 2 weeks of stopping MAOI therapy. -other migraine medicines like almotriptan, eletriptan, naratriptan, sumatriptan, zolmitriptan - do not take within 24 hours of taking rizatriptan -tryptophan This medicine may also interact with the following medications: -medicines for mental depression, anxiety  or mood problems -propranolol This list may not describe all possible interactions. Give your health care provider a list of all the medicines, herbs, non-prescription drugs, or dietary supplements you use. Also tell them if you smoke, drink alcohol, or use illegal drugs. Some items may interact with your medicine. What should I watch for while using this medicine? Only take this medicine for a migraine headache. Take it if you get warning symptoms or at the start of a migraine attack. It is not for regular use to prevent migraine attacks. You may get drowsy or dizzy. Do not drive, use machinery, or do anything that needs mental alertness until you know how this medicine affects you. To reduce dizzy or fainting spells, do not sit or stand up quickly, especially if you are an older  patient. Alcohol can increase drowsiness, dizziness and flushing. Avoid alcoholic drinks. Smoking cigarettes may increase the risk of heart-related side effects from using this medicine. If you take migraine medicines for 10 or more days a month, your migraines may get worse. Keep a diary of headache days and medicine use. Contact your healthcare professional if your migraine attacks occur more frequently. What side effects may I notice from receiving this medicine? Side effects that you should report to your doctor or health care professional as soon as possible: -allergic reactions like skin rash, itching or hives, swelling of the face, lips, or tongue -fast, slow, or irregular heart beat -increased or decreased blood pressure -seizures -severe stomach pain and cramping, bloody diarrhea -signs and symptoms of a blood clot such as breathing problems; changes in vision; chest pain; severe, sudden headache; pain, swelling, warmth in the leg; trouble speaking; sudden numbness or weakness of the face, arm or leg -tingling, pain, or numbness in the face, hands, or feet Side effects that usually do not require medical attention (report to your doctor or health care professional if they continue or are bothersome): -drowsiness -dry mouth -feeling warm, flushing, or redness of the face -headache -muscle cramps, pain -nausea, vomiting -unusually weak or tired This list may not describe all possible side effects. Call your doctor for medical advice about side effects. You may report side effects to FDA at 1-800-FDA-1088. Where should I keep my medicine? Keep out of the reach of children. Store at room temperature between 15 and 30 degrees C (59 and 86 degrees F). Protect from light and moisture. Throw away any unused medicine after the expiration date. NOTE: This sheet is a summary. It may not cover all possible information. If you have questions about this medicine, talk to your doctor, pharmacist,  or health care provider.  2018 [employer] Standard (2012-08-27 10:17:42)

## 2016-06-09 NOTE — Progress Notes (Signed)
Ketorolac 30mg /51ml administered IM to R deltoid and Compazine 10 mg/2 ml administered to L deltoid using aseptic technique. Bandaids applied. Pt c/o injection site discomfort. Water given. Pt ambulatory to check-out.

## 2016-06-09 NOTE — Progress Notes (Signed)
[facility]    Provider:  Dr Jaynee Eagles Referring Provider: Abner Greenspan, MD Primary Care Physician:  Tower, Wynelle Fanny, MD  CC:  Chronic migraine  Nortriptyline helped. She was sick on May 6th sinus, sore throat and at that time she was sweaty. Since then she feels hot every day, her whole body sweats and she feels hot. Last Sunday she started having a headache, neck and head was sore, different than migraines, her whole body is burning. The headache feels soreness and stabbing pain on the top of the head and everywhere. Hard to pinpoint where it is hurting. Her whole jaw hurts and on the sides of the temples, she is congestion her eyes feel heavy and pain and burning and eyes swollen both eyes even her face both sides. Sumatriptan didn't help. Her neck hurts, she has a lymph node swollen on the back of her neck. Her knees hurt.She has heaviness in the head and sore the whole head feels sore. Sensitive to the touch. She has light sensitivity, sound sensitivity and she is having black spots in her vision. Headache was severe on Wednesday and it has improved. She has nausea. She also feels shortness of breath.   HPI:  Sarah Castillo is a 31 y.o. female here as a referral from Dr. Glori Bickers for migraine. PMHx of headaches. She has eye pressure in the back of the head. Her arms get numb and tingly when she lays down mostly the right. She has to move her arm around and it wakes her up from sleep. Ever since she remembers she has had headaches worsen in the last 3 months. She has pain and soreness all over the body. She described the headaches as pressure all over the head and stabbing pain on the right side of the head. They start on the left and spread to the eye and bad of the head and jaw. She has pressure and stabbing. Painful eyes and her eyes can get really puffy and she has a lot of pressure pain in the temples and stabbing. The light bothers her, she doesn't want to talk, she  wants to be in a dark quiet room sitting still. She is having the headaches every day. The headaches occur later in the day around 530-6pm. No aura. She has had left eye blurry vision and fuzziness last month, she wasn;t seeing clearly like she has a film on top of her eyes and she saw the ophthalmologist, her eyes were red and eye pain on movement. She says she may have Fibromyalgia. She takes ibuprofen and tylenol. Occ she wakes with headaches. She doesn't get sound sleep. Difficult for her to sleep and she wakes up with headache and sore body every day and she is irritated due to the pain and discomfort.  She is in bed 9 hours. The headaches can be severe. She has nausea. Mother with headaches. She also has right arm weakness and numbness. No other focal neurologic deficits, associated symptoms, inciting events or modifiable factors.  Reviewed notes, labs and imaging from outside physicians, which showed:  CT head 12/2013 (personally reviewed images and agree with the following):  Skull and Sinuses:Negative for fracture or destructive process. The  mastoids, middle ears, and imaged paranasal sinuses are clear.   Orbits: No acute abnormality.   Brain: No evidence of acute infarction, hemorrhage, hydrocephalus,  or mass lesion/mass effect.   IMPRESSION:  Negative head CT.   CBC 09/2015 and CMP 06/2015 nml (BUN 10, creatinine  0.72)  Review of Systems: Patient complains of symptoms per HPI as well as the following symptoms: fatigue, eye pain. Pertinent negatives per HPI. All others negative.    Social History   Social History  . Marital status: Married    Spouse name: N/A  . Number of children: 1  . Years of education: Bachelor   Occupational History  . mother    Social History Main Topics  . Smoking status: Never Smoker  . Smokeless tobacco: Never Used  . Alcohol use No  . Drug use: No  . Sexual activity: Yes    Partners: Male    Birth control/ protection: None    Other Topics Concern  . Not on file   Social History Narrative   From [place] originally       Moved here from [place] in 2015    Husband is a medical resident at [facility] [employer] for IM       She stays home -considering school and starting a family       Caffeine use: Drinks tea 2x/day   Coffee prn per patient    Family History  Problem Relation Age of Onset  . Heart attack Father   . Hypertension Father   . Hyperlipidemia Father   . Colon cancer Cousin   . Diabetes Paternal Grandmother   . Diabetes Paternal Grandfather   . Diabetes Maternal Grandmother   . Diabetes Maternal Grandfather   . Hypertension Mother   . Thyroid disease Mother   . Hyperlipidemia Mother     Past Medical History:  Diagnosis Date  . Acute blood loss anemia 01/01/2015  . Allergy   . Anxiety   . Depression   . Diabetes mellitus without complication ([place])   . Gestational diabetes mellitus (GDM), antepartum   . IBS (irritable bowel syndrome)   . PCOS (polycystic ovarian syndrome)     Past Surgical History:  Procedure Laterality Date  . CESAREAN SECTION N/A 12/16/2014   Procedure: CESAREAN SECTION;  Surgeon: Crawford Givens, MD;  Location: [facility];  Service: Obstetrics;  Laterality: N/A;  . COLONOSCOPY W/ BIOPSIES  01/19/09   normal  . ESOPHAGOGASTRODUODENOSCOPY  08/03/08   mild gastritis and HH  . LASIK  2012  . LUMBAR DISC SURGERY  09/2007   L5S1  . LUMBAR FUSION  12/2012   L5S1    Current Outpatient Prescriptions  Medication Sig Dispense Refill  . amoxicillin-clavulanate (AUGMENTIN) 875-125 MG tablet Take 1 tablet by mouth 2 (two) times daily. 20 tablet 0  . ARTIFICIAL TEAR OP Apply 1 drop to eye as needed (dry eyes).    . benzonatate (TESSALON) 200 MG capsule Take 1 capsule (200 mg total) by mouth 3 (three) times daily as needed for cough. 30 capsule 0  . diclofenac sodium (VOLTAREN) 1 % GEL Apply 2 g topically 4 (four) times daily. As needed for pain to affected areas 100 g 1  . ergocalciferol  (VITAMIN D2) 50000 units capsule Take 1 capsule (50,000 Units total) by mouth once a week. 12 capsule 0  . fluticasone (FLONASE) 50 MCG/ACT nasal spray Place 2 sprays into both nostrils daily. 48 g 3  . ibuprofen (ADVIL,MOTRIN) 600 MG tablet Take 1 tablet (600 mg total) by mouth every 6 (six) hours as needed for mild pain. 30 tablet 2  . loratadine (CLARITIN) 10 MG tablet Take 10 mg by mouth at bedtime.     . nortriptyline (PAMELOR) 10 MG capsule Take 2 capsules (20 mg total) by mouth at  bedtime. 60 capsule 6  . Olopatadine HCl 0.2 % SOLN Apply 1 drop to eye daily. 1 Bottle 11  . omeprazole (PRILOSEC) 20 MG capsule TAKE 1 CAPSULE BY MOUTH 2 TIMES DAILY BEFORE A MEAL. 180 capsule 1  . SUMAtriptan (IMITREX) 50 MG tablet Take 1 tablet (50 mg total) by mouth once as needed for migraine. May repeat in 2 hours. No more than twice in one day. 10 tablet 6   No current facility-administered medications for this visit.     Allergies as of 06/09/2016 - Review Complete 05/19/2016  Allergen Reaction Noted  . Flexeril [cyclobenzaprine]  11/10/2015  . Metformin and related Diarrhea 11/25/2013  . Oxymetazoline Other (See Comments) 05/19/2016  . Zoloft [sertraline] Diarrhea 01/06/2014  . Hydromorphone Rash 05/04/2015  . Morphine Rash and Other (See Comments) 08/05/2013    Vitals: Ht 5\' 5"  (1.651 m)   Wt 174 lb 9.6 oz (79.2 kg)   LMP 05/13/2016   BMI 29.05 kg/m  Last Weight:  Wt Readings from Last 1 Encounters:  06/09/16 174 lb 9.6 oz (79.2 kg)   Last Height:   Ht Readings from Last 1 Encounters:  06/09/16 5\' 5"  (1.651 m)       Physical exam: Exam: Gen: NAD, conversant, well nourised, obese, well groomed                     CV: RRR, no MRG. No Carotid Bruits. No peripheral edema, warm, nontender Eyes: Conjunctivae clear without exudates or hemorrhage  Neuro: Detailed Neurologic Exam  Speech:    Speech is normal; fluent and spontaneous with normal comprehension.  Cognition:    The  patient is oriented to person, place, and time;     recent and remote memory intact;     language fluent;     normal attention, concentration,     fund of knowledge Cranial Nerves:    The pupils are equal, round, and reactive to light. The fundi are normal and spontaneous venous pulsations are present. Visual fields are full to finger confrontation. Extraocular movements are intact. Trigeminal sensation is intact and the muscles of mastication are normal. The face is symmetric. The palate elevates in the midline. Hearing intact. Voice is normal. Shoulder shrug is normal. The tongue has normal motion without fasciculations.   Coordination:    Normal finger to nose and heel to shin. Normal rapid alternating movements.   Gait:    Heel-toe and tandem gait are normal.   Motor Observation:    No asymmetry, no atrophy, and no involuntary movements noted. Tone:    Normal muscle tone.    Posture:    Posture is normal. normal erect    Strength:    Strength is V/V in the upper and lower limbs.      Sensation: intact to LT     Reflex Exam:  DTR's:    Deep tendon reflexes in the upper and lower extremities are normal bilaterally.   Toes:    The toes are downgoing bilaterally.   Clonus:    Clonus is absent.       Assessment/Plan:  31 year old with chronic headaches likely migrainous but given the intractable nature of the symptoms and eye pain/vision changes and the positional nature (she wakes with the headaches) will check an MRI brain for causes of intracranial increased pressure such as space-occupying lesion or IIH.  MRI brain w/wo contrast scheduled Monday.   Needs follow up with pcp for symptoms not consistent  with migraine including hot flashes, sweating, pain all over body, enlarged lymph nodes on neck all after a viral illness earlier this month.   Stop imitrex. Maxalt and zofran: Please take one tablet at the onset of your headache. If it does not improve the  symptoms please take one additional tablet in 2 hours. Do not take more then 2 tablets in 24hrs. Do not take use more then 2 to 3 times in a week.  Continue Nortriptyline  Discussed [facility]  PT for migraine and musculoskeletal neck pain and right arm weakness/numbness with neck pain with massage and physical therapy. Can try physical therapy at [facility].   Remember to drink plenty of fluid, eat healthy meals and do not skip any meals. Try to eat protein with a every meal and eat a healthy snack such as fruit or nuts in between meals. Try to keep a regular sleep-wake schedule and try to exercise daily, particularly in the form of walking, 20-30 minutes a day, if you can.   As far as your medications are concerned, I would like to suggest:  Continue Nortriptyline 20mg  at night Sumatriptan at onset of headache. Please take one tablet at the onset of your headache. If it does not improve the symptoms please take one additional tablet in 2 hours. Do not take more then 2 tablets in 24hrs. Do not take use more then 2 to 3 days in a week.  Provided toradol and compazine Im today. Recommended nerve blocks but she declined.   I would like to see you back in 3 months, sooner if we need to. Please call us with any interim questions, concerns, problems, updates or refill requests.   Our phone number is [phone]. We also have an after hours call service for urgent matters and there is a physician on-call for urgent questions. For any emergencies you know to call 911 or go to the nearest emergency room  To prevent or relieve headaches, try the following:  Cool Compress. Lie down and place a cool compress on your head.   Avoid headache triggers. If certain foods or odors seem to have triggered your migraines in the past, avoid them. A headache diary might help you identify triggers.   Include physical activity in your daily routine. Try a daily walk or other moderate aerobic exercise.   Manage  stress. Find healthy ways to cope with the stressors, such as delegating tasks on your to-do list.   Practice relaxation techniques. Try deep breathing, yoga, massage and visualization.   Eat regularly. Eating regularly scheduled meals and maintaining a healthy diet might help prevent headaches. Also, drink plenty of fluids.   Follow a regular sleep schedule. Sleep deprivation might contribute to headaches  Consider biofeedback. With this mind-body technique, you learn to control certain bodily functions - such as muscle tension, heart rate and blood pressure - to prevent headaches or reduce headache pain.    Proceed to emergency room if you experience new or worsening symptoms or symptoms do not resolve, if you have new neurologic symptoms or if headache is severe, or for any concerning symptom.    Nerve blocks Compazine and toradol Cont nortrip Mri brain Monday Needs to see a pcp for all her other symptoms  Sarina Ill, MD  [facility] [address] [place], [place] [address]  Phone [phone] Fax [phone]  A total of 25 minutes was spent face-to-face with this patient. Over half this time was spent on counseling patient on  the migraine diagnosis and different diagnostic and therapeutic options available.

## 2016-06-12 ENCOUNTER — Ambulatory Visit
Admission: RE | Admit: 2016-06-12 | Discharge: 2016-06-12 | Disposition: A | Discharge status: Home / Self Care | Payer: 59 | Source: Ambulatory Visit | Attending: Neurology | Admitting: Neurology

## 2016-06-12 DIAGNOSIS — H539 Unspecified visual disturbance: Secondary | ICD-10-CM | POA: Insufficient documentation

## 2016-06-12 DIAGNOSIS — R51 Headache with orthostatic component, not elsewhere classified: Secondary | ICD-10-CM

## 2016-06-12 DIAGNOSIS — H5710 Ocular pain, unspecified eye: Secondary | ICD-10-CM | POA: Diagnosis not present

## 2016-06-12 DIAGNOSIS — R29898 Other symptoms and signs involving the musculoskeletal system: Secondary | ICD-10-CM | POA: Diagnosis not present

## 2016-06-12 DIAGNOSIS — R519 Headache, unspecified: Secondary | ICD-10-CM

## 2016-06-12 MED ORDER — GADOBENATE DIMEGLUMINE 529 MG/ML IV SOLN
20.0000 mL | Freq: Once | INTRAVENOUS | Status: AC | PRN
  Administered 2016-06-12: 17 mL via INTRAVENOUS

## 2016-06-13 ENCOUNTER — Telehealth: Payer: Self-pay | Admitting: Family Medicine

## 2016-06-13 ENCOUNTER — Ambulatory Visit: Payer: Self-pay | Admitting: Family Medicine

## 2016-06-13 DIAGNOSIS — Z0289 Encounter for other administrative examinations: Secondary | ICD-10-CM

## 2016-06-13 NOTE — Telephone Encounter (Signed)
Yes-if she did not call to cancel  Thanks

## 2016-06-13 NOTE — Telephone Encounter (Signed)
Pt did not show up for their acute visit (sweats, headache).  Do you want to charge No Show Fee?

## 2016-06-21 DIAGNOSIS — N943 Premenstrual tension syndrome: Secondary | ICD-10-CM | POA: Diagnosis not present

## 2016-06-21 DIAGNOSIS — Z6829 Body mass index (BMI) 29.0-29.9, adult: Secondary | ICD-10-CM | POA: Diagnosis not present

## 2016-06-21 DIAGNOSIS — R102 Pelvic and perineal pain: Secondary | ICD-10-CM | POA: Diagnosis not present

## 2016-06-21 DIAGNOSIS — N644 Mastodynia: Secondary | ICD-10-CM | POA: Diagnosis not present

## 2016-06-21 DIAGNOSIS — Z124 Encounter for screening for malignant neoplasm of cervix: Secondary | ICD-10-CM | POA: Diagnosis not present

## 2016-06-21 DIAGNOSIS — Z3040 Encounter for surveillance of contraceptives, unspecified: Secondary | ICD-10-CM | POA: Diagnosis not present

## 2016-06-21 DIAGNOSIS — Z01419 Encounter for gynecological examination (general) (routine) without abnormal findings: Secondary | ICD-10-CM | POA: Diagnosis not present

## 2016-06-30 MED FILL — NORTRIPTYLINE HCL 10 MG CAP: 10 | 30 days supply | Qty: 60 | Fill #4 | Status: TO

## 2016-07-17 ENCOUNTER — Ambulatory Visit: Payer: 59 | Admitting: Neurology

## 2017-07-17 ENCOUNTER — Other Ambulatory Visit (INDEPENDENT_AMBULATORY_CARE_PROVIDER_SITE_OTHER): Payer: Self-pay

## 2017-07-17 DIAGNOSIS — Z9889 Other specified postprocedural states: Secondary | ICD-10-CM

## 2017-07-17 DIAGNOSIS — M48061 Spinal stenosis, lumbar region without neurogenic claudication: Secondary | ICD-10-CM

## 2017-07-17 DIAGNOSIS — M5136 Other intervertebral disc degeneration, lumbar region: Secondary | ICD-10-CM

## 2017-07-23 ENCOUNTER — Other Ambulatory Visit (INDEPENDENT_AMBULATORY_CARE_PROVIDER_SITE_OTHER): Payer: Self-pay | Admitting: Physician Assistant

## 2017-07-23 DIAGNOSIS — Z981 Arthrodesis status: Secondary | ICD-10-CM

## 2017-08-08 ENCOUNTER — Ambulatory Visit (INDEPENDENT_AMBULATORY_CARE_PROVIDER_SITE_OTHER): Payer: TRICARE Prime—HMO | Admitting: Neurological Surgery

## 2017-08-08 VITALS — BP 120/77 | HR 93 | Ht 64.0 in | Wt 181.0 lb

## 2017-08-08 DIAGNOSIS — E663 Overweight: Secondary | ICD-10-CM

## 2017-08-08 DIAGNOSIS — M5116 Intervertebral disc disorders with radiculopathy, lumbar region: Secondary | ICD-10-CM

## 2017-08-08 DIAGNOSIS — M5136 Other intervertebral disc degeneration, lumbar region: Secondary | ICD-10-CM

## 2017-08-08 DIAGNOSIS — M79605 Pain in left leg: Secondary | ICD-10-CM

## 2017-08-08 DIAGNOSIS — Z981 Arthrodesis status: Secondary | ICD-10-CM

## 2017-08-08 MED ORDER — METAXALONE 800 MG PO TABS
800.0000 mg | ORAL_TABLET | Freq: Every evening | ORAL | 0 refills | Status: DC | PRN
Start: 2017-08-08 — End: 2019-10-29

## 2017-08-08 MED ORDER — MELOXICAM 15 MG PO TABS
15.0000 mg | ORAL_TABLET | Freq: Every day | ORAL | 1 refills | Status: DC
Start: 2017-08-08 — End: 2018-05-22

## 2017-08-09 ENCOUNTER — Ambulatory Visit: Admission: RE | Admit: 2017-08-09 | Payer: Self-pay | Source: Ambulatory Visit

## 2017-08-09 ENCOUNTER — Other Ambulatory Visit: Payer: Self-pay

## 2017-08-09 NOTE — Progress Notes (Signed)
[facility]  New Patient Note    Referring MD: Buck Mam, DO  Primary Care MD: Buck Mam    MRN: [id number]    HPI     Chief Complaint   Patient presents with   . Initial Consult   . Back Pain     HPI    Patient ID: [name] [name] is a 32 y.o.  female, DOB November 27, 1985 here today for evaluation of back and leg pain. Patient know to group and is s/p L5-S1 TLIF in 12/2012. Her new symptoms are as follows:  Lower back pain, burning, spasm  Back feels "weak"  Bilateral lower legs with intermittent tingling  Sx aggravated by prolonged standing, walking, bending, housework  VAS 3-9/10  Sx reduced with lying down, massage, heat, stretching  Working with Systems analyst but they have her doing some exercises that have made pain worse  No formal PT      Medical History     Past Medical History:   Diagnosis Date   . Cold 11-30-12    ABX=TOOK UNTIL 12-06-12   . Colitis    . Complication of anesthesia     STATES WOKE-UP DURING SURGERY   . Gastroesophageal reflux disease    . Irritable bowel syndrome    . Low back pain     LUMBAR DISK HERNIATIONS   . Pancreatitis    . PCOS (polycystic ovarian syndrome)    . Seasonal allergies        Surgical History     Past Surgical History:   Procedure Laterality Date   . BACK SURGERY  2009     L5-S1 Micro diskectomy    . LAMINECTOMY, LUMBAR, TLIF  12/13/2012    Procedure: LAMINECTOMY, LUMBAR, TLIF;  Surgeon: Marlaine Hind, MD;  Location: [facility];  Service: Neurosurgery;  Laterality: N/A;  L5-S1 TLIF   . LASIK     . SPINAL FUSION  December 13, 2012    L5-S1 Posterolateral Fusion, Left Hemilaminectomy , Transforaminal Diskectomy        Family History   No family history on file.     Social History     Social History     Social History   . Marital status: Married     Spouse name: N/A   . Number of children: N/A   . Years of education: N/A     Occupational History   . Not on file.     Social History Main Topics   . Smoking status: Never Smoker   .  Smokeless tobacco: Never Used   . Alcohol use No   . Drug use: No   . Sexual activity: Not on file     Other Topics Concern   . Not on file     Social History Narrative   . No narrative on file       Current Medications     Current Outpatient Prescriptions   Medication Sig Dispense Refill   . raNITIdine (ZANTAC) 150 MG tablet Take 150 mg by mouth 2 (two) times daily     . vitamin D (CHOLECALCIFEROL) 1000 UNIT tablet Take 1,000 Units by mouth daily     . acetaminophen (TYLENOL) 500 MG tablet Take 500 mg by mouth.     . ALPRAZolam (XANAX) 0.5 MG tablet Take 0.5 mg by mouth 2 (two) times daily as needed.     . busPIRone (BUSPAR) 5 MG tablet Take 5 mg by mouth 2 (  two) times daily.     . cetirizine (ZYRTEC) 10 MG tablet Take 5 mg by mouth nightly.     . fluticasone (FLONASE) 50 MCG/ACT nasal spray 2 sprays by Nasal route 2 (two) times daily.     Marland Kitchen gabapentin (NEURONTIN) 400 MG capsule Take 1 capsule (400 mg total) by mouth every 8 (eight) hours. 60 capsule 0   . meloxicam (MOBIC) 15 MG tablet Take 1 tablet (15 mg total) by mouth daily 30 tablet 1   . metaxalone (SKELAXIN) 800 MG tablet Take 1 tablet (800 mg total) by mouth nightly as needed (muscle spasm) 30 tablet 0   . norgestimate-ethinyl estradiol (ORTHO-CYCLEN) 0.25-35 MG-MCG per tablet Take 1 tablet by mouth daily. 28 tablet 12   . omeprazole (PRILOSEC) 20 MG capsule Take 20 mg by mouth 2 (two) times daily.       No current facility-administered medications for this visit.        Allergies     Allergies   Allergen Reactions   . Morphine        Review of Systems   Review of Systems  Refer to New Patient intake form for more detailed information. Form has been reviewed, signed and scanned into the patient's chart.    Physical Examination   VITAL SIGNS:   height is 1.626 m (5\' 4" ) and weight is 82.1 kg (181 lb). Her blood pressure is 120/77 and her pulse is 93.            Neurologic Exam  Physical Exam    GENERAL: The patient is a well-developed, well-nourished and in  no apparent distress.     HEENT: Head is normocephalic and atraumatic. Extraocular muscles are intact. Pupils are equal, round, and reactive to light and accommodation. Nares appeared normal. Mucous membranes are moist.     NECK: Supple. No obvious masses    LUNGS: Clear to auscultation.    HEART: Regular rate and rhythm without murmur.    EXTREMITIES: Without any cyanosis, clubbing, rash, lesions or edema.    SKIN: No ulceration or induration present.    MENTAL STATUS/PSYCH: The patient is awake, alert, appropriate. Recent and remote memory are intact. Attention span, concentration, and language function are appropriate. No evidence of aphasia. No current agitation, delirium, depressed affect or evidence of delusions or hallucinations. Affect appropriate for mood.     NEURO:  AxOx3   PERRL, EOMI BL. No nystagmus.   Face is symmetric, tongue midline  Speech clear/fluent and appropriate    Motor:   RUE G 5/5 WE 5/5 B 5/5 T 5/5 D 5/5  LUE  G 5/5 WE 5/5 B 5/5 T 5/5 D 5/5  RLE IP 5/5 Q 5/5 H 5/5 DF 5/5 PF 5/5   LLE   IP 5/5 Q 5/5 H 5/5 DF 5/5 PF 5/5     Muscle tone is normal.     Sensory: intact to light touch and PP bilateral upper and lower extremities.     Gait: normal    Radiology Interpretation   MRI Lumbar spine WO (07/24/17) was reviewed. Good position of instrumentation. There is a central disc protrusion at L4-5 with bilateral foraminal narrowing.     Impression   S/p L5-S1 TLIF; recurrent lower back and BLE pain. MRI does not show any significant central canal or nerve root stenosis/compression that would warrant surgery at this time. Recommend patient go to PT (w/dry needling), and go for nutrition counseling. We will see her back in  3 months.    Plan   Exercises to avoid with trainer:  Axial loading of spine  Abdominal crunches  RX: mobic and skelaxin  Orders Placed This Encounter   Procedures   . Ambulatory referral to Physical Therapy     Referral Priority:   Routine     Referral Type:   Consultation      Referral Reason:   Specialty Services Required     Requested Specialty:   Physical Therapy     Number of Visits Requested:   1   . Ambulatory referral to Nutrition Services     Referral Priority:   Routine     Referral Type:   Consultation     Referral Reason:   Specialty Services Required     Requested Specialty:   Registered Dietitian     Number of Visits Requested:   1     Follow-up   3 months    Marlaine Hind, MD   Joy A. Rostron, PA-C

## 2017-09-12 ENCOUNTER — Encounter (INDEPENDENT_AMBULATORY_CARE_PROVIDER_SITE_OTHER): Payer: Self-pay

## 2017-11-21 ENCOUNTER — Other Ambulatory Visit (INDEPENDENT_AMBULATORY_CARE_PROVIDER_SITE_OTHER): Payer: Self-pay | Admitting: Physician Assistant

## 2017-11-21 ENCOUNTER — Encounter (INDEPENDENT_AMBULATORY_CARE_PROVIDER_SITE_OTHER): Payer: Self-pay | Admitting: Neurological Surgery

## 2017-11-27 IMAGING — DX DG LUMBAR SPINE BEND(FLEX/EXT) ONLY 2-3 V
4 series · 4 of 4 positions shown · non-contrast
Comparison: CT scan 06/17/2015

CLINICAL DATA: History of lumbar fusion.  Left-sided low back pain.

EXAM:
LUMBAR SPINE FLEX AND EXTEND ONLY - 2-3 VIEW

[l-spine lat]
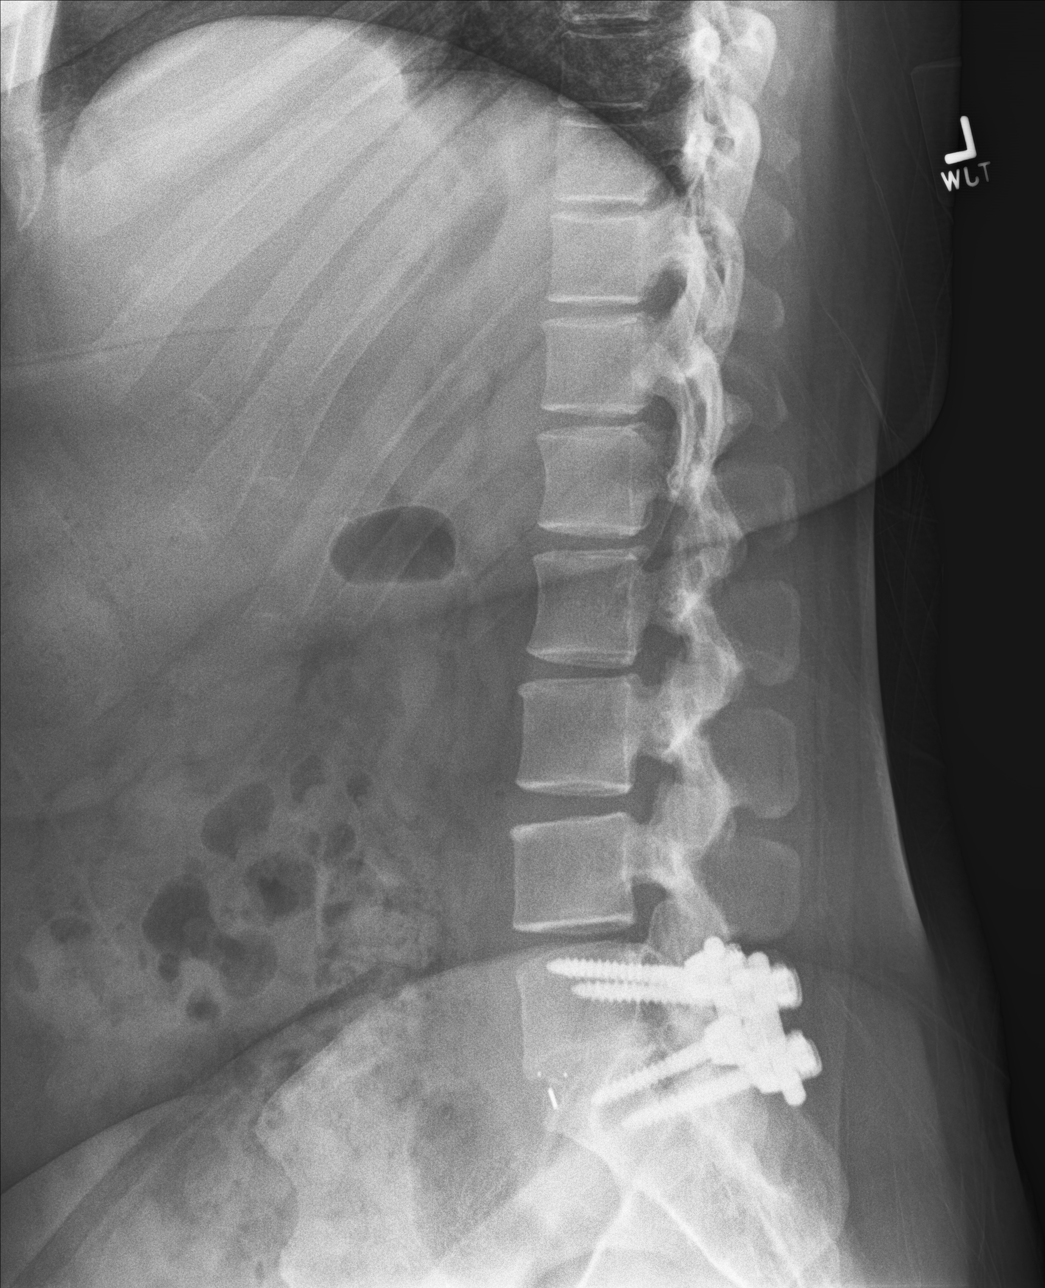

[l-spine flex]
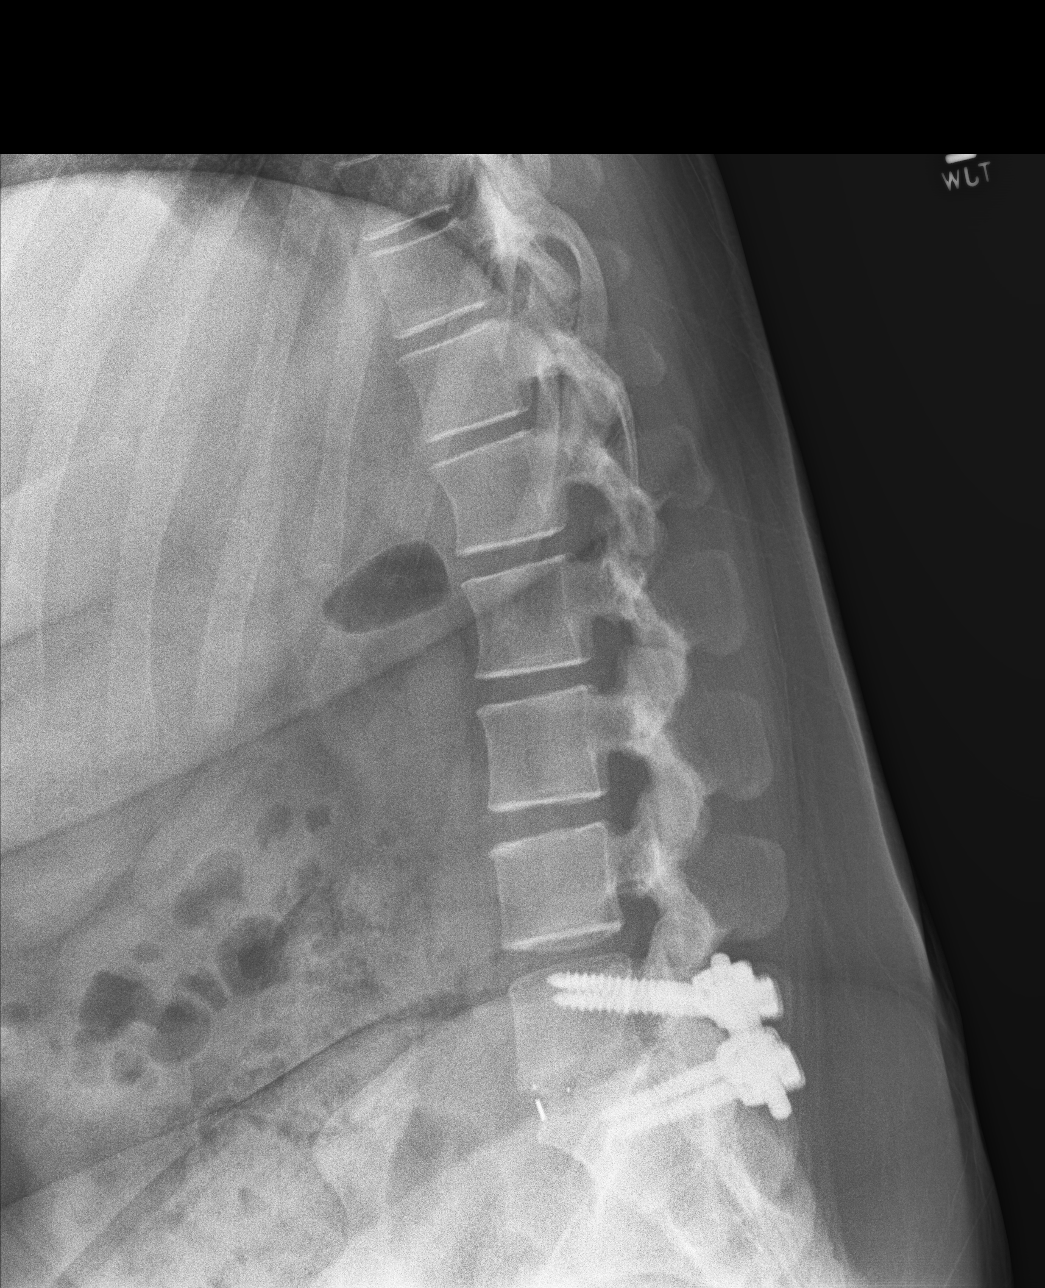

[l-spine ext]
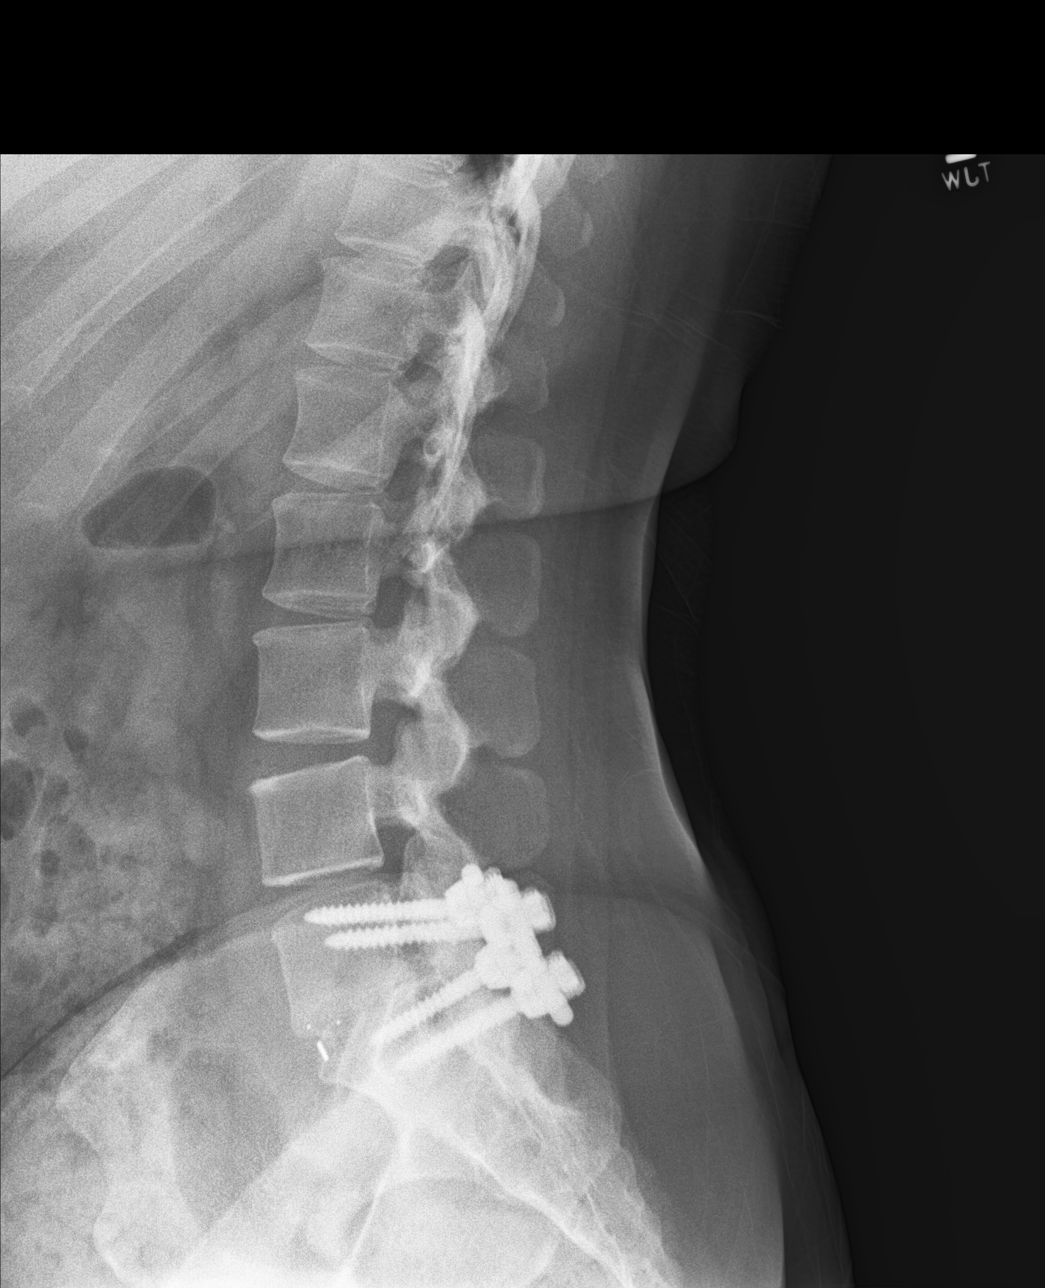

[l-spine l5/s1]
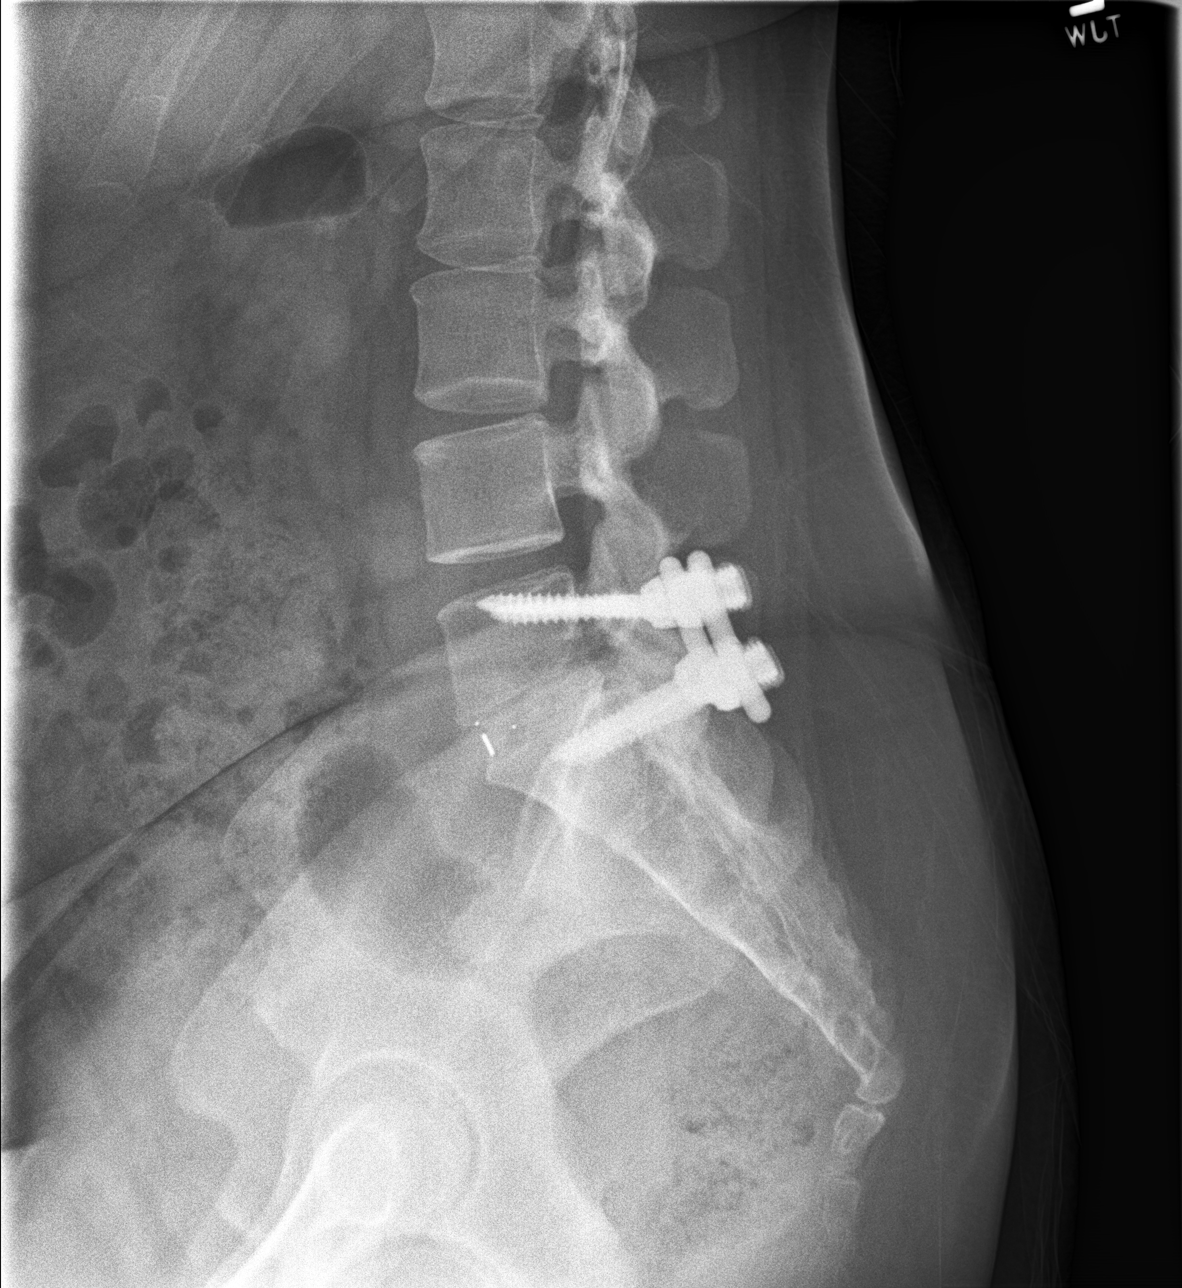

[4 of 4 positions shown; findings below may reference images not displayed]

FINDINGS: Normal alignment of the lumbar vertebral bodies. L5-S1 fusion
hardware appears intact. No complicating features are demonstrated.
The vertebral bodies are intact. Disc spaces are maintained. No
instability or subluxation with flexion/extension but limited range
of motion.
IMPRESSION: Normal alignment and no acute bony findings.

Stable L5-S1 fusion hardware with no complicating features.

No instability/subluxation with flexion/ extension but limited range
of motion.

## 2018-03-29 IMAGING — MR MR HEAD WO/W CM
9 of 13 series · 30 of 48 positions shown · IV contrast (multihance)
Comparison: Head CT without contrast 12/22/2013.

CLINICAL DATA: 30-year-old female with intractable headache for the
last 2 weeks. Blurred vision and nausea. Chronic headaches.

EXAM:
MRI HEAD WITHOUT AND WITH CONTRAST
TECHNIQUE: Multiplanar, multiecho pulse sequences of the brain and surrounding
structures were obtained without and with intravenous contrast.
CONTRAST:  17mL MULTIHANCE GADOBENATE DIMEGLUMINE 529 MG/ML IV SOLN

[Series 4: DWI · axial · 4.0mm · 0.94mm/px · z∈[-53,+114]mm · 3 of 42 slices shown (1 of 2)]
[im 1/42]
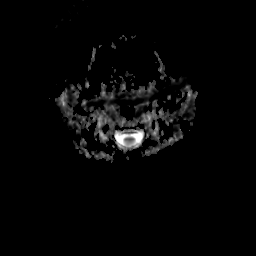
[im 21/42]
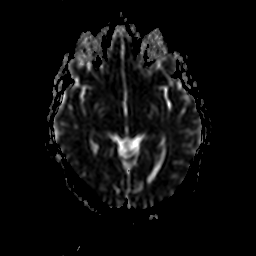
[im 42/42]
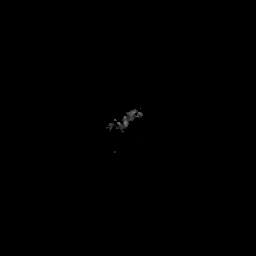

[Series 6: DWI · coronal · 5.0mm · 1.80mm/px · 4 of 38 slices shown (2 of 2)]
[im 1/38]
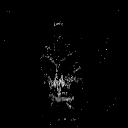
[im 13/38]
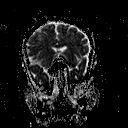
[im 25/38]
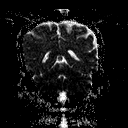
[im 38/38]
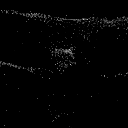

[Series 10: T2 · axial · 5.0mm · 0.45mm/px · z∈[-47,+115]mm · 3 of 26 slices shown (1 of 3)]
[im 1/26]
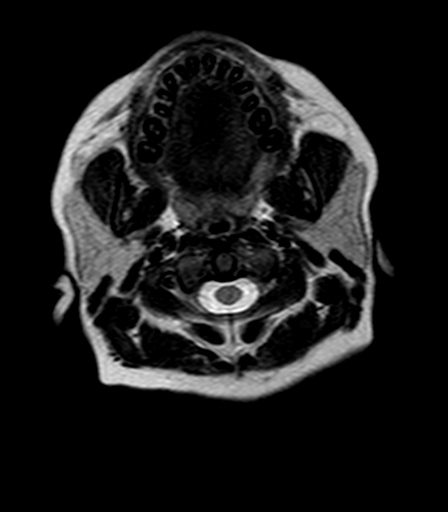
[im 13/26]
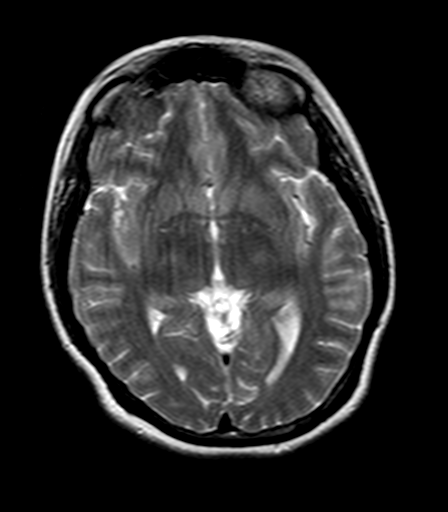
[im 26/26]
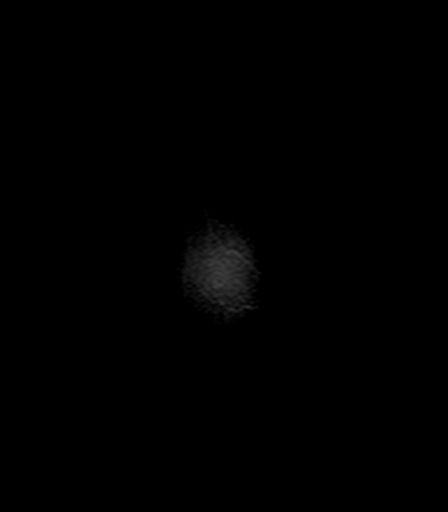

[Series 11: FLAIR · axial · 5.0mm · 0.90mm/px · z∈[-47,+115]mm · 3 of 26 slices shown]
[im 1/26]
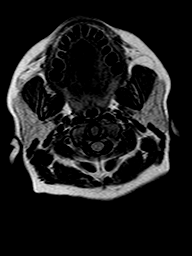
[im 13/26]
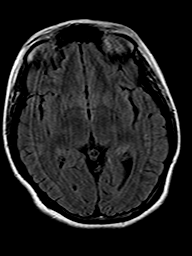
[im 26/26]
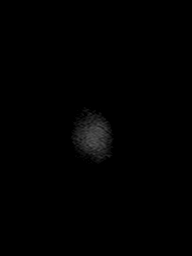

[Series 12: T2 · axial · 5.0mm · 0.45mm/px · z∈[-47,+115]mm · 3 of 26 slices shown (2 of 3)]
[im 1/26]
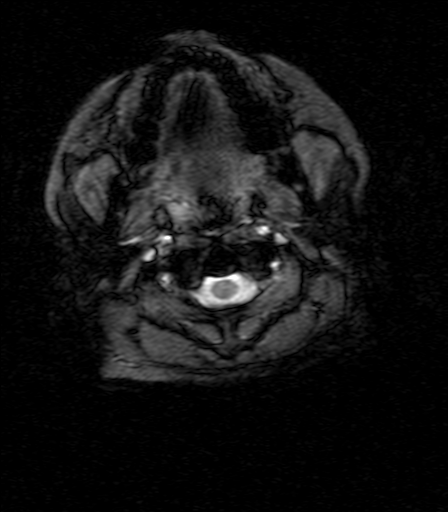
[im 13/26]
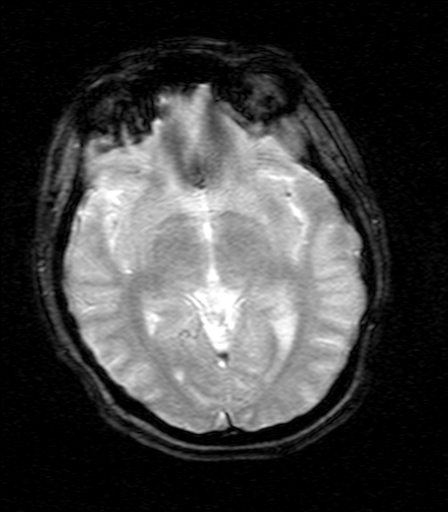
[im 26/26]
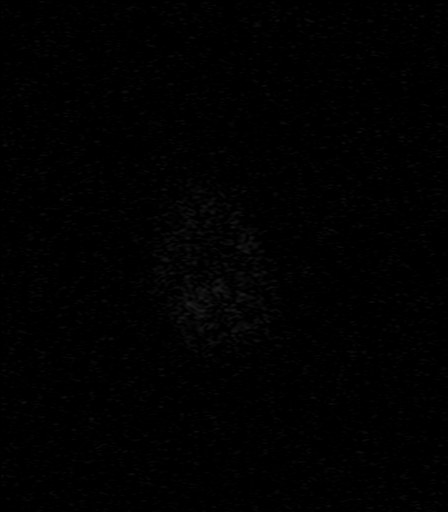

[Series 14: T2 · coronal · 5.0mm · 0.45mm/px · 2 of 29 slices shown (3 of 3)]
[im 1/29]
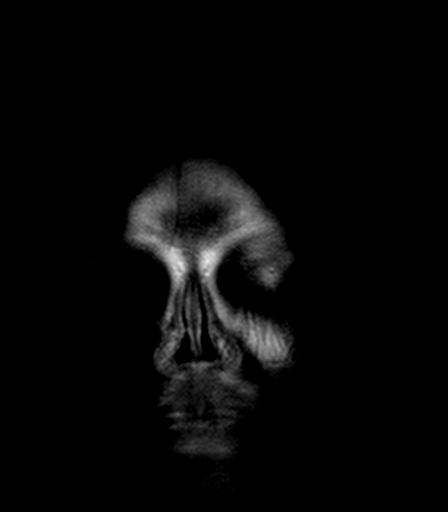
[im 15/29]
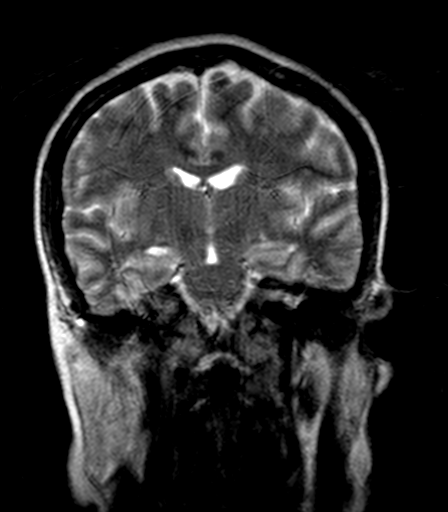

[Series 15: T1 post-contrast · axial · 3.0mm · 0.45mm/px · z∈[-54,+122]mm · 6 of 60 slices shown (1 of 3)]
[im 1/60]
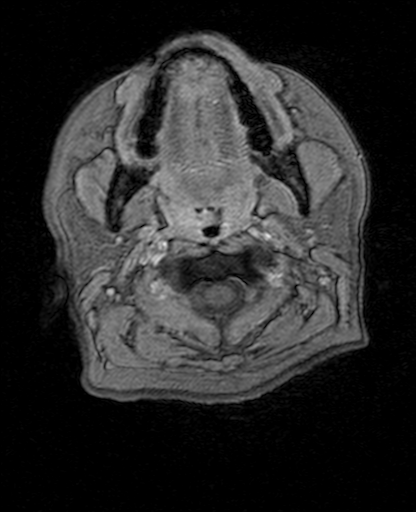
[im 12/60]
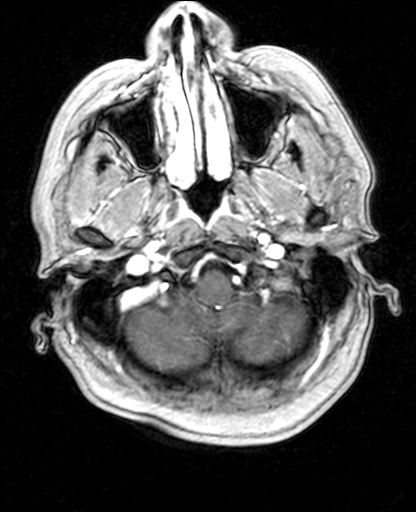
[im 24/60]
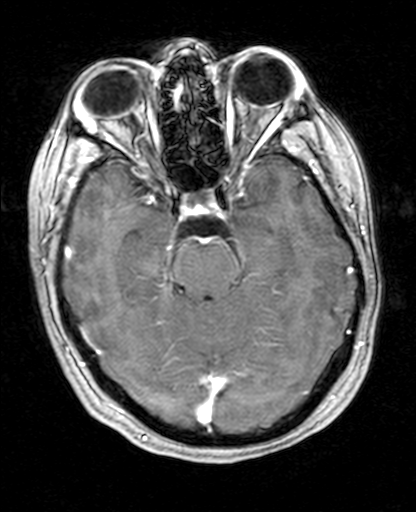
[im 36/60]
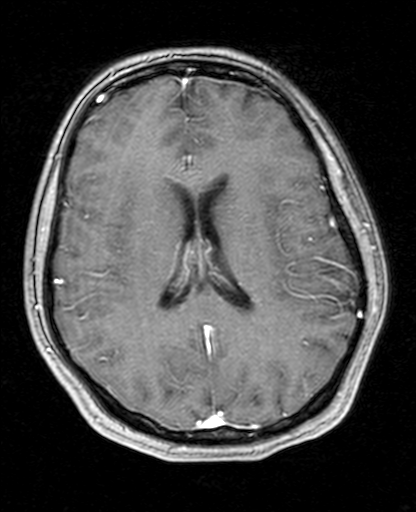
[im 48/60]
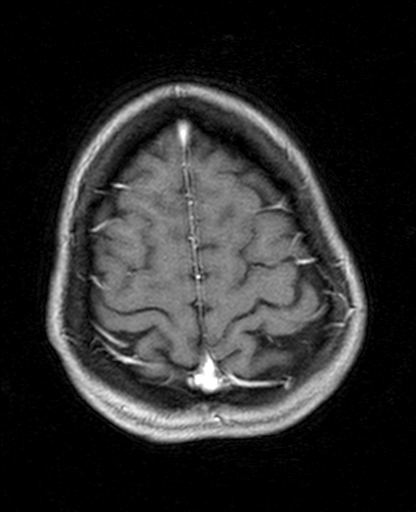
[im 60/60]
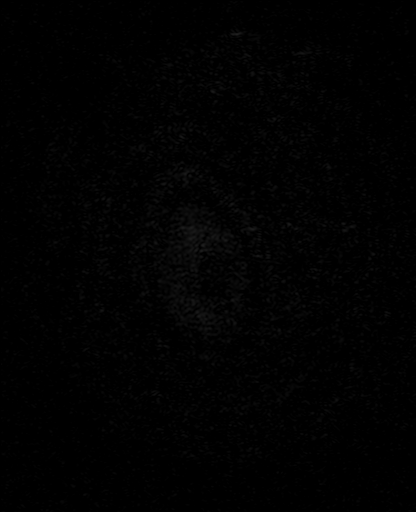

[Series 16: T1 post-contrast · coronal · 5.0mm · 0.45mm/px · 3 of 29 slices shown (2 of 3)]
[im 1/29]
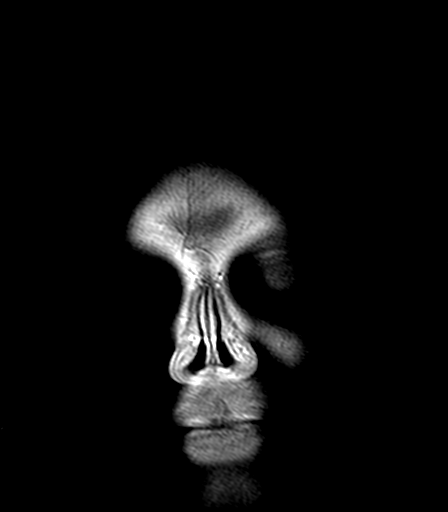
[im 15/29]
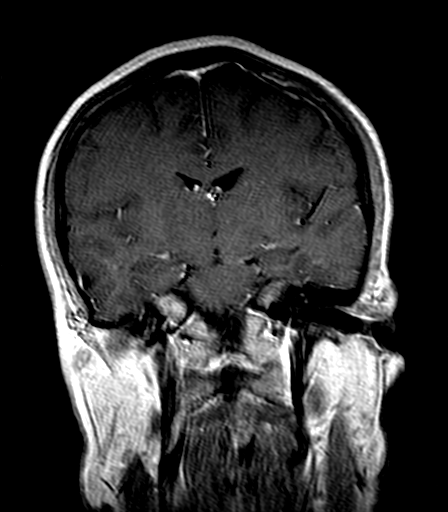
[im 29/29]
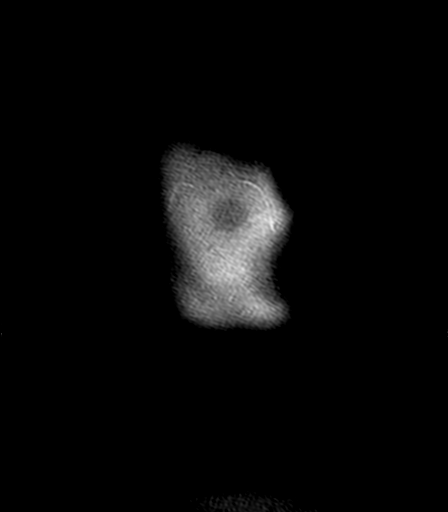

[Series 17: T1 post-contrast · sagittal · 5.0mm · 0.45mm/px · 3 of 29 slices shown (3 of 3)]
[im 1/29]
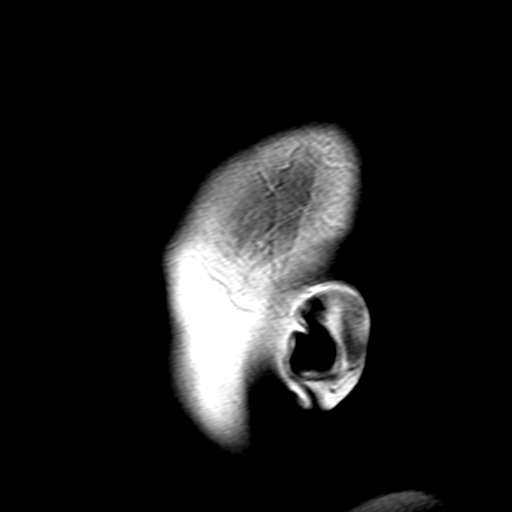
[im 15/29]
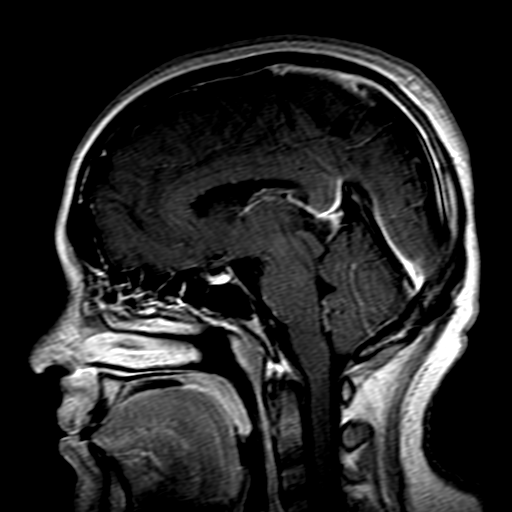
[im 29/29]
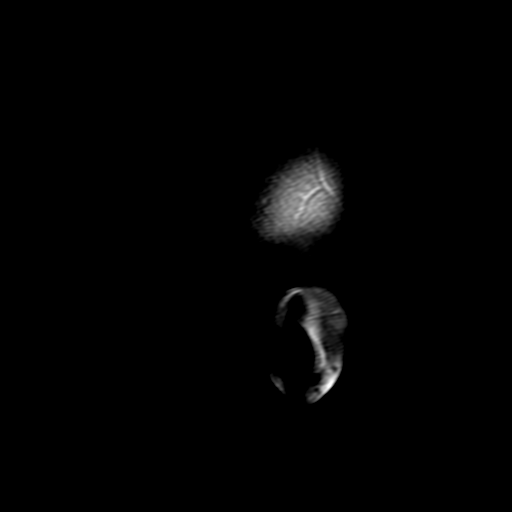

[30 of 48 positions shown; findings below may reference images not displayed]

FINDINGS: Brain: Cerebral volume is normal. No restricted diffusion to suggest
acute infarction. No midline shift, mass effect, evidence of mass
lesion, ventriculomegaly, extra-axial collection or acute
intracranial hemorrhage. Cervicomedullary junction and pituitary are
within normal limits.

Scattered subtle small foci of nonspecific cerebral white matter T2
and FLAIR hyperintensity are mildly advanced for age in the brain
(e.g. Series 11, image 20). No cortical encephalomalacia or chronic
cerebral blood products identified. Other gray and white matter
signal is normal. No abnormal enhancement identified. No dural
thickening.

Vascular: Major intracranial vascular flow voids appear normal.

Skull and upper cervical spine: Negative. Visualized bone marrow
signal is within normal limits.

Sinuses/Orbits: Normal orbits soft tissues. Trace paranasal sinus
mucosal thickening, appears inconsequential.

Other: Mastoids are clear. Visible internal auditory structures
appear normal. Negative scalp soft tissues.
IMPRESSION: No acute intracranial abnormality and largely unremarkable MRI
appearance of the brain.

Scattered small cerebral white matter signal changes are mildly
advanced for age but nonspecific.

## 2018-05-22 ENCOUNTER — Telehealth (INDEPENDENT_AMBULATORY_CARE_PROVIDER_SITE_OTHER): Payer: TRICARE Prime—HMO | Admitting: Neurological Surgery

## 2018-05-22 DIAGNOSIS — Z981 Arthrodesis status: Secondary | ICD-10-CM

## 2018-05-22 NOTE — Progress Notes (Signed)
TELEMEDICINE VISIT              CLINICAL SUMMARY        Verbal Consent      Verbal consent has been obtained from the patient to conduct a telephone visit encounter to minimize exposure to COVID-19: yes      Chief Complaint:      Chief Complaint   Patient presents with    Status post lumbar spinal fusion     Follow-up   [name] [name] presents for follow-up status post L5-S1 T lift 2014.  She has a myriad of complaints at this time.  Mainly pain in the knee and ankle bilaterally with difficulty lifting and walking.  She also has some back pain that limits her ability to stand for any period of time, do activities around the house and care for her youngest child.    She is tried physical therapy with limited benefit.      Problem List, Medications, and Allergies reviewed: yes      HPI:  HPI  See above      Assessment/Plan:    1. Status post lumbar spinal fusion  - MRI Lumbar Spine WO Contrast    Patient has significant limitations secondary to ongoing low back pain with radiation to lower extremities.  She will have a MRI of the lumbosacral spine in order to evaluate for progressive degenerative disc disease or nerve root impingement.  If above studies are negative then she was referred to orthopedic surgery for evaluation of her knees and ankles.  A note will be given so that she will qualify for childcare on [employer].    Time spent in medical discussion: 21 to 30 minutes    Marlaine Hind, MD

## 2018-05-23 ENCOUNTER — Encounter (INDEPENDENT_AMBULATORY_CARE_PROVIDER_SITE_OTHER): Payer: Self-pay

## 2018-06-18 ENCOUNTER — Encounter (INDEPENDENT_AMBULATORY_CARE_PROVIDER_SITE_OTHER): Payer: Self-pay | Admitting: Neurological Surgery

## 2018-09-19 ENCOUNTER — Encounter (INDEPENDENT_AMBULATORY_CARE_PROVIDER_SITE_OTHER): Payer: Self-pay

## 2018-09-19 ENCOUNTER — Encounter (INDEPENDENT_AMBULATORY_CARE_PROVIDER_SITE_OTHER): Payer: Self-pay | Admitting: Neurological Surgery

## 2018-09-19 NOTE — Progress Notes (Addendum)
Received call from [place] at [facility]. Per her request, I faxed the MRI lumbar spine w/o contrast (order date 05/22/18) to her attn at fax#[phone]

## 2018-09-20 ENCOUNTER — Encounter (INDEPENDENT_AMBULATORY_CARE_PROVIDER_SITE_OTHER): Payer: Self-pay

## 2018-09-23 ENCOUNTER — Encounter (INDEPENDENT_AMBULATORY_CARE_PROVIDER_SITE_OTHER): Payer: Self-pay

## 2018-09-23 NOTE — Progress Notes (Signed)
Faxed patient referral authorization form to [facility] ph# [phone] fax# [phone]. Confirmation received.    Pt scheduled to have MRI L spine on 9/16 at [facility] in [place] for open MRI. [facility] ph# [phone] contact: Asher Muir.

## 2018-09-24 ENCOUNTER — Encounter (INDEPENDENT_AMBULATORY_CARE_PROVIDER_SITE_OTHER): Payer: Self-pay

## 2018-09-24 NOTE — Progress Notes (Signed)
I called and LVM for pt's spouse, who I have been communicating with about pt's MRI. I LVM at [phone] to inform him that [employer] is requesting a referral from pt's Primary Care Provider. I offered to send him the message received by [facility] that was passed onto me. Awaiting call back.

## 2018-10-17 ENCOUNTER — Encounter (INDEPENDENT_AMBULATORY_CARE_PROVIDER_SITE_OTHER): Payer: Self-pay

## 2018-11-18 ENCOUNTER — Encounter (INDEPENDENT_AMBULATORY_CARE_PROVIDER_SITE_OTHER): Payer: Self-pay | Admitting: Neurological Surgery

## 2018-11-18 ENCOUNTER — Telehealth (INDEPENDENT_AMBULATORY_CARE_PROVIDER_SITE_OTHER): Payer: Self-pay | Admitting: Nurse Practitioner

## 2018-11-18 NOTE — Telephone Encounter (Signed)
Patients husband called a few times in regards to making an appointment and inquiring about patients MRI. Per Shanda Bumps the approval for MRI needs to come from [facility] since she is pregnant and they need to follow up with Dr. Deloria Lair first in order to get MRI order put in. The patients husband was notified that we need a referral in order to schedule a follow up with Dr. Deloria Lair. No referral has been sent yet. Also the appointment can be scheduled with Dr. Deloria Lair with his soonest availability per Shanda Bumps.

## 2018-12-03 NOTE — Progress Notes (Signed)
[facility]  Follow up Note    Previous Impression/Plan             Chief Complaint   Patient presents with    Status post lumbar spinal fusion     Follow-up   [name] [name] presents for follow-up status post L5-S1 T lift 2014.  She has a myriad of complaints at this time.  Mainly pain in the knee and ankle bilaterally with difficulty lifting and walking.  She also has some back pain that limits her ability to stand for any period of time, do activities around the house and care for her youngest child.    She is tried physical therapy with limited benefit.        1. Status post lumbar spinal fusion  - MRI Lumbar Spine WO Contrast    Patient has significant limitations secondary to ongoing low back pain with radiation to lower extremities.  She will have a MRI of the lumbosacral spine in order to evaluate for progressive degenerative disc disease or nerve root impingement.  If above studies are negative then she was referred to orthopedic surgery for evaluation of her knees and ankles.  A note will be given so that she will qualify for childcare on [employer].         HPI   [name] [name] is a 33 year-old female presenting today for follow-up regarding ongoing low back pain. Surgical history significant for L5-S1 TLIF in 2014. Currently in her third trimester of pregnancy. Experiencing bilateral lower extremity pain, left greater than right. Experiences occasional tingling in the back. Present with his husband.    Physical Examination   VITAL SIGNS:   vitals were not taken for this visit.        Awake, alert, oriented x3, Follows commands  GCS: 15   Speech is clear  Attention span and concentration: intact  Recent and remote memory: intact    Gait Intact  Able to walk on toes and heels    Point tenderness: None     Lower extremity strength:     HF KE KF DF PF EHL   Right 5 5 5 5 5 5    Left  5 5 5 5 5 5       Sensation to light touch intact throughout lower  extremities    DTRs:       Patellar Ankle   Right 2+ 2+   Left  2+ 2+     Clonus: right negative, left negative   Babinski: right negative, left negative     Straight leg raise:  right negative, left negative       Review of Systems   Review of Systems   Musculoskeletal: Positive for back pain.         Radiology Interpretation   No imaging studies for review    Impression/Plan     [name] [name] is a 33 year-old female in her third trimester of pregnancy with persistent low back pain. Patient was advised to follow-up after delivering the baby.    Follow-up   Follow-up after delivery    Marlaine Hind, MD

## 2018-12-04 ENCOUNTER — Encounter (INDEPENDENT_AMBULATORY_CARE_PROVIDER_SITE_OTHER): Payer: Self-pay | Admitting: Neurological Surgery

## 2018-12-04 ENCOUNTER — Ambulatory Visit (INDEPENDENT_AMBULATORY_CARE_PROVIDER_SITE_OTHER): Payer: TRICARE Prime—HMO | Admitting: Neurological Surgery

## 2018-12-04 VITALS — BP 130/67 | HR 101 | Ht 65.0 in | Wt 188.0 lb

## 2018-12-04 DIAGNOSIS — M545 Low back pain: Secondary | ICD-10-CM

## 2018-12-04 DIAGNOSIS — M79605 Pain in left leg: Secondary | ICD-10-CM

## 2018-12-04 DIAGNOSIS — Z981 Arthrodesis status: Secondary | ICD-10-CM

## 2018-12-10 ENCOUNTER — Encounter (INDEPENDENT_AMBULATORY_CARE_PROVIDER_SITE_OTHER): Payer: Self-pay

## 2019-01-29 ENCOUNTER — Ambulatory Visit (INDEPENDENT_AMBULATORY_CARE_PROVIDER_SITE_OTHER): Payer: Self-pay | Admitting: Neurological Surgery

## 2019-09-17 ENCOUNTER — Ambulatory Visit (INDEPENDENT_AMBULATORY_CARE_PROVIDER_SITE_OTHER): Payer: Self-pay | Admitting: Neurological Surgery

## 2019-10-29 ENCOUNTER — Encounter (INDEPENDENT_AMBULATORY_CARE_PROVIDER_SITE_OTHER): Payer: Self-pay | Admitting: Neurological Surgery

## 2019-10-29 ENCOUNTER — Ambulatory Visit (INDEPENDENT_AMBULATORY_CARE_PROVIDER_SITE_OTHER): Payer: TRICARE Prime—HMO | Admitting: Neurological Surgery

## 2019-10-29 VITALS — BP 133/80 | HR 102 | Temp 98.0°F | Resp 15 | Ht 64.0 in | Wt 183.0 lb

## 2019-10-29 DIAGNOSIS — M25562 Pain in left knee: Secondary | ICD-10-CM

## 2019-10-29 DIAGNOSIS — M545 Low back pain, unspecified: Secondary | ICD-10-CM

## 2019-10-29 DIAGNOSIS — M5412 Radiculopathy, cervical region: Secondary | ICD-10-CM

## 2019-10-29 DIAGNOSIS — M25561 Pain in right knee: Secondary | ICD-10-CM

## 2019-10-29 MED ORDER — CYCLOBENZAPRINE HCL 10 MG PO TABS
10.0000 mg | ORAL_TABLET | Freq: Three times a day (TID) | ORAL | 3 refills | Status: DC | PRN
Start: 2019-10-29 — End: 2019-10-29

## 2019-10-29 MED ORDER — CYCLOBENZAPRINE HCL 10 MG PO TABS
10.0000 mg | ORAL_TABLET | Freq: Three times a day (TID) | ORAL | 3 refills | Status: AC | PRN
Start: 2019-10-29 — End: 2019-11-08

## 2019-10-29 MED ORDER — MELOXICAM 15 MG PO TABS
15.0000 mg | ORAL_TABLET | Freq: Every day | ORAL | 3 refills | Status: AC | PRN
Start: 2019-10-29 — End: 2019-11-28

## 2019-10-29 MED ORDER — MELOXICAM 15 MG PO TABS
15.0000 mg | ORAL_TABLET | Freq: Every day | ORAL | 3 refills | Status: DC | PRN
Start: 2019-10-29 — End: 2019-10-29

## 2019-10-30 ENCOUNTER — Ambulatory Visit: Admission: RE | Admit: 2019-10-30 | Payer: Self-pay | Source: Ambulatory Visit

## 2019-10-30 ENCOUNTER — Other Ambulatory Visit: Payer: Self-pay

## 2019-10-31 NOTE — Progress Notes (Signed)
[facility]  Follow up Note    Previous Impression/Plan   Nur E Tabassum MORRISON MASSER is a 34 year-old female in her third trimester of pregnancy with persistent low back pain. Patient was advised to follow-up after delivering the baby.    HPI     Chief Complaint   Patient presents with    Post-op     Status post lumbar spinal fusion 2014     Nur E Tabassum K Juniel is a 34 year-old female presenting today with hand numbness and bilateral paraspinal pain.  Patient has an MRI of the lumbar spine for further evaluation.  Physical Examination   VITAL SIGNS:   height is 1.626 m (5\' 4" ) and weight is 83 kg (183 lb). Her oral temperature is 98 F (36.7 C). Her blood pressure is 133/80 and her pulse is 102 (abnormal). Her respiration is 15.        Mental Status    Awake, Alert, Oriented x3  Speech clear   GCS 15     Cranial Nerves   CN III, IV, VI   Pupils are equal, round, and reactive to light.  Extraocular motions are normal.     CN V: Normal sensation in all 3 trigeminal divisions b/l     CN VII: Facial expression full, symmetric.     CN VIII: Hearing intact b/l    CN XI: Shoulder shrug symmetric    CN XII: Tongue midline    Motor Exam   Overall muscle tone: normal     BUE/BLE strength 5/5   No pronator drift    Gait: normal    Sensory Exam  Sensation intact to light touch throughout    Reflexes  DTRs symmetric  No ankle clonus  Negative Hoffman's  Negative Babinski    Positive Spurling's test bilaterally    Review of Systems   Review of Systems   All other systems reviewed and are negative.    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    Impression/plan     Nur E EARMA NICOLAOU is a 34 year old female with bilateral hand numbness and bilateral paraspinal pain in  the low back.  MRI of the lumbar spine was reviewed with the patient, showing a broad-based central disc herniation at L4-5 impinging upon the traversing bilateral L5 nerve roots, reveals Elting and mild to moderate central canal stenosis.  We will order an MRI of the cervical spine for further evaluation, MRI of the bilateral knees, referral to orthopedic surgery for knee pain, refer patient to physical therapy to address low back pain, and offered prescription for Flexeril and Mobic.  She will plan to follow-up upon completion of MRI of cervical spine.    Orders Placed This Encounter   Procedures    MRI Cervical Spine WO Contrast     Standing Status:   Future     Standing Expiration Date:   10/28/2020     Order Specific Question:   Does the patient have a pacemaker or defibrillator?     Answer:   No     Order Specific Question:   Clinical info for radiologist     Answer:   cervical radiculopathy     Order Specific Question:   Release to patient  Answer:   Immediate    MRI Knee Left  WO Contrast     Standing Status:   Future     Standing Expiration Date:   10/28/2020     Order Specific Question:   Does the patient have a pacemaker or defibrillator?     Answer:   No     Order Specific Question:   Is the patient pregnant?     Answer:   No     Order Specific Question:   Clinical info for radiologist     Answer:   knee pain     Order Specific Question:   Release to patient     Answer:   Immediate    MRI Knee Right  WO Contrast     Standing Status:   Future     Standing Expiration Date:   10/28/2020     Order Specific Question:   Does the patient have a pacemaker or defibrillator?     Answer:   No     Order Specific Question:   Is the patient pregnant?     Answer:   No     Order Specific Question:   Clinical info for radiologist     Answer:   knee pain     Order Specific Question:   Release to patient     Answer:   Immediate    Referral to Physical Therapy - EXTERNAL     Referral Priority:   Routine     Referral  Type:   Consultation     Referral Reason:   Specialty Services Required     Requested Specialty:   Physical Therapy     Number of Visits Requested:   1    Sports Medicine Referral: Sherilyn Cooter, MD     Referral Priority:   Routine     Referral Type:   Consultation     Referral Reason:   Specialty Services Required     Referred to Provider:   Gae Gallop, MD     Requested Specialty:   Orthopaedic Sports Medicine     Number of Visits Requested:   1     Follow-up   After MRI of cervical spine    All relevant and clinical information was transcribed by me, Rondel Oh, NP-C, acting as a scribe for Dr. Rosemarie Beath.    Marlaine Hind, MD

## 2019-11-21 ENCOUNTER — Encounter (INDEPENDENT_AMBULATORY_CARE_PROVIDER_SITE_OTHER): Payer: Self-pay

## 2020-06-09 ENCOUNTER — Telehealth (INDEPENDENT_AMBULATORY_CARE_PROVIDER_SITE_OTHER): Payer: Self-pay | Admitting: Neurological Surgery

## 2020-06-09 NOTE — Telephone Encounter (Signed)
Left patient a voicemail. Please confirm if all MRIs were completed. If so, can schedule 6/6 (I can provide a time if needed)

## 2020-06-17 ENCOUNTER — Ambulatory Visit
Admission: RE | Admit: 2020-06-17 | Discharge: 2020-06-17 | Disposition: A | Discharge status: Home / Self Care | Payer: Self-pay | Source: Ambulatory Visit

## 2020-06-17 ENCOUNTER — Other Ambulatory Visit: Payer: Self-pay

## 2020-06-23 ENCOUNTER — Ambulatory Visit (INDEPENDENT_AMBULATORY_CARE_PROVIDER_SITE_OTHER): Payer: TRICARE Prime—HMO | Admitting: Neurological Surgery

## 2020-07-27 ENCOUNTER — Telehealth (INDEPENDENT_AMBULATORY_CARE_PROVIDER_SITE_OTHER): Payer: Self-pay | Admitting: Neurological Surgery

## 2020-07-27 NOTE — Telephone Encounter (Signed)
Attempted to call the patient to confirm video call visit with imaging.   Patient stated the MRI of both knees was for her ortho surgeon and Dr. Deloria Lair would only be reviewing the MRI C-spine. Patient stated that she will be faxing over the MRI C-spine report through our fax at [phone].    Confirmed appt with patient with imaging.    3:52PM ET  07/27/20  Tyson Babinski, MA

## 2020-07-28 ENCOUNTER — Encounter (INDEPENDENT_AMBULATORY_CARE_PROVIDER_SITE_OTHER): Payer: TRICARE Prime—HMO | Admitting: Neurological Surgery

## 2020-07-28 ENCOUNTER — Telehealth (INDEPENDENT_AMBULATORY_CARE_PROVIDER_SITE_OTHER): Payer: Self-pay | Admitting: Neurological Surgery

## 2020-07-28 NOTE — Telephone Encounter (Addendum)
Attempted to call patient to proceed with televisit.   Patient had indicated that she gave disks to the front desk, however, she had only brought in CT imaging at the time (06/17/20) when it was the MRI that was required. Patient constantly denied that she brought in MRI imaging when in reality it was CT. There was no MRI that was uploaded into her chart, only CT.      I had spoken to the patient yesterday (07/27/20) and indicated that she will require MRI imaging that was ordered. Patient stated that she has had all MRIs (x3) done, however, patient indicated that only MRI of the C-spine was required for this appointment and bilateral knees MRI would not be required and indicated that imaging would be for ortho. Today, the patient mentioned that MRI bilateral knees was required for the appointment. There was difficulty in terms of clarification of which type of imaging was required for the patient. The order that was put in by Rondel Oh was MRI C-spine and MRI of bilateral knees.    The patient had just moved to [facility] and currently at her brother's house ([place]). We informed that the patient is required to have imaging and not only reports in order to proceed with the visit. I had offered to reschedule with [place], however, patient indicated that she will call back instead.
